# Patient Record
Sex: Female | Born: 1990 | Race: White | Hispanic: No | Marital: Single | Smoking: Former smoker
Health system: Southern US, Community
[De-identification: ages and names within clinical notes are randomized; demographics above are authoritative.]

## PROBLEM LIST (undated history)

## (undated) ENCOUNTER — Inpatient Hospital Stay (HOSPITAL_COMMUNITY): Payer: Self-pay

## (undated) DIAGNOSIS — B1920 Unspecified viral hepatitis C without hepatic coma: Secondary | ICD-10-CM

## (undated) DIAGNOSIS — Z8269 Family history of other diseases of the musculoskeletal system and connective tissue: Secondary | ICD-10-CM

## (undated) DIAGNOSIS — O152 Eclampsia in the puerperium: Secondary | ICD-10-CM

## (undated) DIAGNOSIS — Z8349 Family history of other endocrine, nutritional and metabolic diseases: Secondary | ICD-10-CM

## (undated) DIAGNOSIS — Z8249 Family history of ischemic heart disease and other diseases of the circulatory system: Secondary | ICD-10-CM

## (undated) DIAGNOSIS — A499 Bacterial infection, unspecified: Secondary | ICD-10-CM

## (undated) DIAGNOSIS — R011 Cardiac murmur, unspecified: Secondary | ICD-10-CM

## (undated) DIAGNOSIS — Z87898 Personal history of other specified conditions: Secondary | ICD-10-CM

## (undated) DIAGNOSIS — F1991 Other psychoactive substance use, unspecified, in remission: Secondary | ICD-10-CM

## (undated) DIAGNOSIS — Z87440 Personal history of urinary (tract) infections: Secondary | ICD-10-CM

## (undated) DIAGNOSIS — Z8619 Personal history of other infectious and parasitic diseases: Secondary | ICD-10-CM

## (undated) DIAGNOSIS — Z789 Other specified health status: Secondary | ICD-10-CM

## (undated) DIAGNOSIS — K759 Inflammatory liver disease, unspecified: Secondary | ICD-10-CM

## (undated) HISTORY — DX: Eclampsia complicating the puerperium: O15.2

## (undated) HISTORY — DX: Family history of ischemic heart disease and other diseases of the circulatory system: Z82.49

## (undated) HISTORY — DX: Personal history of other infectious and parasitic diseases: Z86.19

## (undated) HISTORY — DX: Personal history of urinary (tract) infections: Z87.440

## (undated) HISTORY — DX: Family history of other endocrine, nutritional and metabolic diseases: Z83.49

## (undated) HISTORY — PX: NO PAST SURGERIES: SHX2092

## (undated) HISTORY — DX: Bacterial infection, unspecified: A49.9

## (undated) HISTORY — DX: Other psychoactive substance use, unspecified, in remission: F19.91

## (undated) HISTORY — DX: Family history of other diseases of the musculoskeletal system and connective tissue: Z82.69

## (undated) HISTORY — DX: Personal history of other specified conditions: Z87.898

## (undated) HISTORY — DX: Inflammatory liver disease, unspecified: K75.9

---
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
## 2009-12-27 ENCOUNTER — Emergency Department (HOSPITAL_BASED_OUTPATIENT_CLINIC_OR_DEPARTMENT_OTHER): Admission: EM | Admit: 2009-12-27 | Discharge: 2009-12-27 | Payer: Self-pay | Admitting: Emergency Medicine

## 2010-05-20 ENCOUNTER — Emergency Department (HOSPITAL_COMMUNITY): Admission: EM | Admit: 2010-05-20 | Discharge: 2010-05-20 | Payer: Self-pay | Admitting: Emergency Medicine

## 2010-11-19 DIAGNOSIS — Z87440 Personal history of urinary (tract) infections: Secondary | ICD-10-CM

## 2010-11-19 HISTORY — DX: Personal history of urinary (tract) infections: Z87.440

## 2010-12-10 ENCOUNTER — Encounter: Payer: Self-pay | Admitting: Neurology

## 2011-02-04 LAB — URINALYSIS, ROUTINE W REFLEX MICROSCOPIC
Bilirubin Urine: NEGATIVE
Bilirubin Urine: NEGATIVE
Glucose, UA: NEGATIVE mg/dL
Glucose, UA: NEGATIVE mg/dL
Ketones, ur: NEGATIVE mg/dL
Nitrite: NEGATIVE
Protein, ur: 100 mg/dL — AB
Specific Gravity, Urine: 1.02 (ref 1.005–1.030)
Specific Gravity, Urine: 1.024 (ref 1.005–1.030)
Urobilinogen, UA: 1 mg/dL (ref 0.0–1.0)
pH: 6.5 (ref 5.0–8.0)
pH: 8.5 — ABNORMAL HIGH (ref 5.0–8.0)

## 2011-02-04 LAB — URINE MICROSCOPIC-ADD ON

## 2011-02-04 LAB — DIFFERENTIAL
Lymphocytes Relative: 14 % (ref 12–46)
Lymphs Abs: 1.9 10*3/uL (ref 0.7–4.0)
Monocytes Absolute: 0.5 10*3/uL (ref 0.1–1.0)
Monocytes Relative: 4 % (ref 3–12)
Neutro Abs: 11 10*3/uL — ABNORMAL HIGH (ref 1.7–7.7)

## 2011-02-04 LAB — URINE CULTURE: Colony Count: 45000

## 2011-02-04 LAB — POCT I-STAT, CHEM 8
BUN: 6 mg/dL (ref 6–23)
Calcium, Ion: 1.18 mmol/L (ref 1.12–1.32)
Chloride: 107 mEq/L (ref 96–112)
Glucose, Bld: 89 mg/dL (ref 70–99)
Potassium: 3.8 mEq/L (ref 3.5–5.1)

## 2011-02-04 LAB — CBC
HCT: 37.4 % (ref 36.0–46.0)
Hemoglobin: 13.1 g/dL (ref 12.0–15.0)
RBC: 4.02 MIL/uL (ref 3.87–5.11)
WBC: 13.7 10*3/uL — ABNORMAL HIGH (ref 4.0–10.5)

## 2011-04-06 NOTE — H&P (Signed)
NAMEARIANNIE, [name]                ACCOUNT NO.:  [id number]   MEDICAL RECORD NO.:  [id number]          PATIENT TYPE:  INP   LOCATION:  9164                          FACILITY:  [facility]   PHYSICIAN:  Crist Fat. Rivard, M.D. DATE OF BIRTH:  December 08, 1990   DATE OF ADMISSION:  09/02/2006  DATE OF DISCHARGE:                                HISTORY & PHYSICAL   Ms. Pesce is a 20 year old gravida 1, para 0 at 30 and 4/7 weeks, who  presented via EMS this morning secondary to two witnessed seizures.  One had  occurred at home at approximately 7 a.m.  This was witnessed by her mother  who described a drawing of the patient's right arm and then generalized  tonic clonic movement with respiratory difficulty.  EMS was called  immediately and the patient then was transported; however, prior to being  placed on the truck, EMS also witnessed another seizure of the same type.  They did report she was combative, status post the seizure and was confused.  She was postictal on arrival here in at the hospital but was arousable.  She  received 2 g of magnesium IV via EMS at 7:25 a.m. and then the patient  pulled out her IV.  Patient on arrival had no memory of the last several  days but her mother denied any knowledge of any drug use, illnesses,  exposures or trauma.  The patient had a mild headache over the last 24 hours  but had no visual symptoms or epigastric pain.  She did call the on call  staff at [facility] yesterday morning at approximately 10:40 with  the report of mild headache with some slight nausea and vomiting.  She  denied any headache visual symptoms, epigastric pain or swelling.  She  continues to deny those issues today.  Her pregnancy has been remarkable for  (1) age 40, (2) questionable last menstrual period, (3) previous smoker.   LABORATORY DATA:  Blood type is O positive, Rh antibody negative, VDRL  nonreactive, rubella titer positive, hepatitis B surface antigen negative.  Group B  strep culture is negative, HIV is declined.  Glucola was normal.  Hemoglobin upon entry into the practice was 13.8.  It was 11.8 at 26 weeks.  Toxoplasmosis titers were negative-negative.  GC and Chlamydia cultures were  negative at her visit, cystic fibrosis testing  was negative.  Patient  declined quadruple screen.  Her group B strep culture and other cultures  were negative at 36 weeks.  EDC of 09/19/2006 was established by ultrasound  secondary to questionable last menstrual period.   HISTORY OF PRESENT PREGNANCY:  Patient entered care at approximately 11  weeks.  She had an ultrasound at her first visit for dating purposes.  She  had a Pap smear in [facility] in February of 2007.  She was treated for BV at that first visit.  Patient had a sinus infection  at approximately 12 weeks, __________ and other OTCs were recommended.  Quadruple screen was declined.  She had another ultrasound at 17 weeks  showing normal  growth and development.  She had a normal Glucola.  She had  an ultrasound at 28 weeks for size less than dates.  Growth was at the 71st  percentile with normal fluid.  She had some contractions at 36 weeks.  The  cervix was long and closed.  She had another exam at 35 weeks with cervix 1  at 50% vertex and a -1 station.  Group B strep cultures, GC and Chlamydia  cultures were done and these were all negative.  She was diagnosed with head  lice on October 12.  This was treated and seems to have resolved.   OBSTETRICAL HISTORY:  The patient is a prima gravida.   PAST MEDICAL HISTORY:  Reports usual childhood illnesses.  She was a  previous smoker for three years but has not smoked since pregnancy.   ALLERGIES:  NO KNOWN DRUG ALLERGIES.   FAMILY HISTORY:  Her maternal grandmother has heart disease.  Her father and  paternal grandfather have hypertension.  Her mother has anemia.  Her  maternal grandmother has thyroid problems.  Her maternal  grandfather had  seizures.  Her maternal grandmother and mother have depression.   GENETIC HISTORY:  Remarkable for the patient being born with heart murmur  but no surgery or medications were needed.   SOCIAL HISTORY:  The patient is single. She does live with her mother and  her younger sister.  She is Caucasian of the [employer] faith.  The father of  the baby has been involved.  He is not present with her on arrival.  The  patient's grandmother is currently present with her and her mother is on the  way.  Patient has a 9th grade education.  She is still a Consulting civil engineer.  Her  partner is also in the 9th grade and he is also a Consulting civil engineer.  She has been  followed by physician services of [facility].  She denies any  alcohol, drug or tobacco use during this pregnancy.  She did stop smoking  with the onset of this pregnancy.   REVIEW OF SYSTEMS:  The patient currently is postictal with some confusion  regarding the day of the week.  She is oriented to person and place, is not  currently oriented to time.  She denies any headache, visual symptoms or  epigastric pain.  She is able to move all of her extremities and is having  no respiratory difficulties.   PHYSICAL EXAMINATION:  Blood pressure is 122/92 and was 166/86, respirations  were 30, pulse 106, oxygen saturations on room air 99%.  CHEST:  Clear.  HEART:  Regular rate and rhythm without murmur.  BREASTS:  Soft and nontender.  ABDOMEN:  Fundal height is approximately 37 cm, estimated fetal weight 7  __________ pounds.  Fetal heart rate initially is 180 to 190 with minimal  variability, now it is approximately 160 with 10 beat acceleration noted.  There are some mild contractions noted every four to five minutes.  Cervix  by Dr. Cloretta Ned exam 2 to 3 cm, 75%, vertex __________ station.  Deep tendon reflexes are 2+ with one beat of clonus on the right.  There is  minimal edema. NEUROLOGIC:  Per Dr. Estanislado Pandy showed a left dilated pupil  with decreased  reactivity, right pupil slightly dilated but reactive.  There is no nuchal  rigidity. Fundi are negative x2.  Reflexes were 2 to 4 with patient  currently on magnesium sulfate. Has good strength in all extremities.  There  is negative CVA tenderness.   Cath UA shows  100 mg of protein on a catheter specimen.  Urine drug screen  is negative.   ASSESSMENT:  1. Intrauterine pregnancy at 37-4/7 weeks.  2. Status post two eclamptic seizures.   PLAN:  1. Admit to birthing suite for consult with Dois Davenport A. Rivard, M.D.,      attending physician.  2. Check pH labs for type and screen for two units of packed red blood      cells if needed.  3. Magnesium sulfate 4 g bolus 2 g per hour.  4. Foley catheter will be maintained.  5. Bedside ultrasound for fluid, growth and presentation with examination      as well of the placenta.  6. Head imaging per Dr. Cloretta Ned plan.  She will consult with radiology      regarding the plan.  7. Further clinic care will be determined by Dr. Estanislado Pandy.      Renaldo Reel Emilee Hero, C.N.M.      Crist Fat Rivard, M.D.  Electronically Signed    VLL/MEDQ  D:  09/02/2006  T:  09/02/2006  Job:  [id number]

## 2011-04-06 NOTE — Consult Note (Signed)
[name], [name]                ACCOUNT NO.:  [id number]   MEDICAL RECORD NO.:  [id number]          PATIENT TYPE:  INP   LOCATION:  9164                          [facility]   PHYSICIAN:  Marlan Palau, M.D.  DATE OF BIRTH:  1990-12-05   DATE OF CONSULTATION:  09/02/2006  DATE OF DISCHARGE:                                   CONSULTATION   HISTORY OF PRESENT ILLNESS:  [name] is a 20 year old, left-handed,  white female born on June 22, 1991.  This patient is a patient that is  pregnant at [redacted] weeks.  The patient was brought into [facility] after 2  witnessed generalized tonic-clonic seizure events.  The patient had been  feeling well up to the point of the seizures.  The patient did note headache  today but really has not had headaches prior to the day of admission.  The  patient denies any visual field disturbance or visual loss.  The patient  initially was somewhat confused when she came in but has rapidly cleared  with mental status.  The patient has been placed on magnesium sulfate and CT  scan of the brain was done showing bilateral parietal and frontal white  matter low density areas.  For this reason, a neurology consultation was  obtained.  The patient is being induced for delivery, does have proteinuria.  Blood pressure is coming in up to 166/86.   PAST MEDICAL HISTORY:  1. History of current pregnancy.  2. Seizures x2, probable eclampsia.  3. No surgical past history.   CURRENT MEDICATIONS:  Magnesium sulfate, Stadol if needed, Pitocin and  promethazine.  The patient has no known allergies.   Smokes a half pack cigarettes daily when she is not pregnant. Does not drink  alcohol, again denies illicit drugs such as cocaine or marijuana.   SOCIAL HISTORY:  This patient lives in the [place] area. She  lives with her mother. This is her first pregnancy.  The patient again is  not married.  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   FAMILY MEDICAL HISTORY:  Mother is alive and well.  Father has history of  hypertension.  The patient has one sister who is alive and well.  No family  history of seizures is noted.   REVIEW OF SYSTEMS:  Notable for no recent fevers, chills.  The patient does  note a headache today but denies any prior history of headache, neck  stiffness.  The patient denies any shortness of breath, chest pains, nausea,  or vomiting.  The patient denies any focal numbness or weakness on the face,  arms or legs.   PHYSICAL EXAMINATION:  VITALS:  Blood pressure is currently 120/92, heart  rate 106, respiratory rate 30.  GENERAL:  This patient is a gravid white female who is sleepy at the time of  examination but can be alerted.  HEENT:  Head is atraumatic. Eyes, pupils are equal, round, and reactive  about 3-4 mm.  Disks soft and flat bilaterally.  NECK:  Supple.  No carotid bruits noted.  RESPIRATORY:  Clear.  CARDIOVASCULAR:  Regular rate and rhythm.  No obvious murmurs or rubs noted.  EXTREMITIES:  Reveal 1+ edema at the ankles bilaterally.  NEUROLOGIC:  Cranial nerves as above.  Facial symmetry is present.  The  patient has good sensation in the face to pinprick and soft touch  bilaterally.  Has good strength in the facial muscles and muscles to head  turning and shoulder shrug bilaterally. Speech is fairly well enunciated,  not aphasic.  Motor testing reveals good strength on all fours, good  symmetric motor tone is noted throughout.  Sensory testing reveals good  symmetry to pinprick, soft touch, vibratory sensation, throughout. The  patient is able is able to perform finger-nose-finger bilaterally.  Heel-to-  shin was not tested.  Gait was not tested.  Deep tendon reflexes are  symmetric, normal arms, somewhat brisk in the legs, 2-3 beats of ankle  clonus were noted.  Toes neutral bilaterally.   LABORATORY VALUES:  Notable for white count of 19.7, hemoglobin 13.6,   hematocrit 38.7, MCV of 90.2, platelets of 249, sodium 136, potassium 3.2,  chloride of 108, CO2 of 13, glucose of 106, BUN of 6, creatinine 0.7, total  bili 0.5, alkaline phosphatase 216, SGOT of 31, SGPT of 20, total protein  5.6, albumin of 2.4, calcium 8.4, uric acid 7.6, LDH of 293, magnesium level  of 4.8.  Urine drug screen was negative.  Urinalysis reveals specific  gravity of 1.030, pH of 6.0, 100 mg/dL protein.   IMPRESSION:  1. New onset of seizures, probably eclampsia syndrome.  2. Abnormal CT scan of the brain likely secondary to #1.   This patient will be induced for delivery at this point which should improve  the eclampsia. Changes in the MRI of the brain are likely related to an  eclampsia syndrome.  Would treat this patient with magnesium for eclampsia  or not add other anticonvulsants at this point.   PLAN:  1. MRI scan of the brain in the a.m. following delivery.  2. Consider followup MRI study in the next 2-3 weeks depending upon the      results of the above. I will follow the patient's clinical course while      in-house.      Marlan Palau, M.D.  Electronically Signed     CKW/MEDQ  D:  09/02/2006  T:  09/03/2006  Job:  [id number]   cc:   Dois Davenport A. Rivard, M.D.  Fax: [phone]

## 2011-04-06 NOTE — Discharge Summary (Signed)
[name], [name]                ACCOUNT NO.:  [id number]   MEDICAL RECORD NO.:  [id number]          PATIENT TYPE:  INP   LOCATION:  9118                          [facility]ILITY:  [facility]   PHYSICIAN:  Crist Fat. Rivard, M.D. DATE OF BIRTH:  [birth year]   DATE OF ADMISSION:  09/02/2006  DATE OF DISCHARGE:  09/07/2006                                 DISCHARGE SUMMARY   ADMISSION DIAGNOSES:  1. Intrauterine pregnancy at 37 4/7 weeks.  2. Eclampsia, status post eclamptic seizure x 2.   DISCHARGE DIAGNOSES:  1. Intrauterine pregnancy at 37 4/7 weeks.  2. Eclampsia, status post eclamptic seizure x 2.  3. Status post vaginal delivery of a viable female infant named [name],      Apgar's 8 and 9, weighing 6 pounds 1 ounce.   HOSPITAL PROCEDURES:  1. Magnesium sulfate administration.  2. Head CT and MRI.  3. Amniotomy.  4. Epidural anesthesia.  5. Spontaneous vaginal delivery of female infant, Apgar's 8 and 9,      weighing 6 pounds 1 ounce.  6. Repair of vulvar lacerations.  7. ICU care.   HOSPITAL COURSE:  The patient was admitted at 37 weeks after having two  witnessed seizure events. The patient was treated with magnesium sulfate to  diminish the seizure activity and other routine care was implemented. She  was seen by neurologist and maternal fetal medicine as well as ob-gyn  service.  Her labor was induced after her neurological exam was cleared.  Amniotomy was performed and Pitocin was begun.  She responded well to that  and had an uneventful labor.  She proceeded to vaginal delivery on 09/02/06  at 2057 hours of a female infant, born vaginally, Apgar's 8 and 9, weighing  6 pounds, 1 ounce. She had bilateral vulvar lacerations which were repaired.  EBL was 400 cc. She was taken to the AICU for monitoring and continued  magnesium sulfate administration. Blood pressures remained in the 130/90  range.  Labs were noted to be abnormal with a elevated D-dimer test. She  also had decrease in  her platelet count over the following days after  delivery from an initial count of 249 down to a nadir of 38 on 09/04/06.  She was followed by neurology and maternal fetal medicine during that time.  She continued to improve. 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 Blood  pressures on 09/04/06 remained 120-130/80-90 range.  She began diuresing on  09/05/06. She developed a fever on 09/04/06 of 102.  Cultures were done.  Recommendation on the 17th was made for IV steroids which was later  discontinued so as not to confound the assessment of the fever.  She had no  further febrile episodes after that.   On 09/05/06, she was afebrile.  Blood pressures were 130/80-90s.  She began  diuresis.  Liver function tests were elevated. She was treated with  Labetalol for her blood pressure.   On 09/05/06, her platelets rose to 70,000.  AST decreased from 234 to 142.  Blood  cultures showed no growth.   On 09/06/06, she had increased diuresis and blood pressures improved to  120/40 over 73/98.  She had a platelet count of 109 and AST decreased  further to 88.   On 09/07/06, she was moved out to Mother Baby Unit on the evening of  09/06/06 and on 09/07/06 she was doing well. Denies any headache or visual  changes. Blood pressures were 132/90 and 131/91.  She continued her  diuresis.  Platelet count was up to 134.  AST was down to 84.  Uric acid was  5.4.  She had her Labetalol increased previously to 300 mg twice a day and  this was felt to be adequate.  She was evaluated by maternal fetal medicine,  Neuro and Ob-Gyn and was felt to have received full benefit from hospital  stay and was discharged home on 09/07/06.   DISCHARGE MEDICATIONS:  1. Motrin 600 mg p.o. q. 6 hours p.r.n.  2. Labetalol 300 mg b.i.d.   DISCHARGE LABS:  White blood cell count 14.1, hemoglobin 9.6, platelets 134.  C-MET was within normal limits  except for slightly elevated AST of 84 and  ALT of 95.  Uric acid was 5.4.   DISCHARGE INSTRUCTIONS:  Per CC will be hand out with additional information  given on signs and symptoms of worsening pre-eclampsia/eclampsia.   DISCHARGE FOLLOW UP:  The patient has pediatric appointment on Monday and  will call to schedule an appointment at [facility] in our office on  same day, on Monday for a blood pressure check.  She will also follow-up  then two weeks later or p.r.n. as there is no Smart-Start Program in the  county in which she [place].  Discussions were had with the maternal fetal  medicine physician and her and her mother on obtaining phone access and  thermometer for care and follow-up.  She was also seen by social work and  deemed well for discharge. She will follow-up p.r.n. and reinforced  instructions were given on how to call our service after hours for any  concerns or questions and patient and mother expressed no questions.   CONDITION ON DISCHARGE:  Good.      [name], C.N.M.      Crist Fat Rivard, M.D.  Electronically Signed    MLW/MEDQ  D:  09/07/2006  T:  09/08/2006  Job:  [id number]

## 2011-08-16 ENCOUNTER — Emergency Department (HOSPITAL_COMMUNITY)
Admission: EM | Admit: 2011-08-16 | Discharge: 2011-08-17 | Disposition: A | Discharge status: Home / Self Care | Payer: Medicaid Other | Attending: Emergency Medicine | Admitting: Emergency Medicine

## 2011-08-16 DIAGNOSIS — J40 Bronchitis, not specified as acute or chronic: Secondary | ICD-10-CM | POA: Insufficient documentation

## 2011-08-16 DIAGNOSIS — E860 Dehydration: Secondary | ICD-10-CM | POA: Insufficient documentation

## 2011-08-16 DIAGNOSIS — O99891 Other specified diseases and conditions complicating pregnancy: Secondary | ICD-10-CM | POA: Insufficient documentation

## 2011-08-16 DIAGNOSIS — K5289 Other specified noninfective gastroenteritis and colitis: Secondary | ICD-10-CM | POA: Insufficient documentation

## 2011-08-16 DIAGNOSIS — R062 Wheezing: Secondary | ICD-10-CM | POA: Insufficient documentation

## 2011-08-16 LAB — CBC
MCV: 88.3 fL (ref 78.0–100.0)
Platelets: 299 10*3/uL (ref 150–400)
RBC: 4.61 MIL/uL (ref 3.87–5.11)
RDW: 11.9 % (ref 11.5–15.5)
WBC: 14.9 10*3/uL — ABNORMAL HIGH (ref 4.0–10.5)

## 2011-08-16 LAB — POCT I-STAT, CHEM 8
Calcium, Ion: 1.17 mmol/L (ref 1.12–1.32)
Chloride: 104 mEq/L (ref 96–112)
HCT: 45 % (ref 36.0–46.0)
Hemoglobin: 15.3 g/dL — ABNORMAL HIGH (ref 12.0–15.0)

## 2011-08-16 LAB — DIFFERENTIAL
Basophils Absolute: 0 10*3/uL (ref 0.0–0.1)
Basophils Relative: 0 % (ref 0–1)
Eosinophils Absolute: 0 10*3/uL (ref 0.0–0.7)
Eosinophils Relative: 0 % (ref 0–5)
Lymphs Abs: 0.8 10*3/uL (ref 0.7–4.0)
Neutrophils Relative %: 90 % — ABNORMAL HIGH (ref 43–77)

## 2011-08-16 LAB — POCT PREGNANCY, URINE: Preg Test, Ur: POSITIVE

## 2011-08-17 LAB — COMPREHENSIVE METABOLIC PANEL
ALT: 18 U/L (ref 0–35)
Alkaline Phosphatase: 81 U/L (ref 39–117)
BUN: 7 mg/dL (ref 6–23)
CO2: 22 mEq/L (ref 19–32)
Calcium: 9.8 mg/dL (ref 8.4–10.5)
Glucose, Bld: 103 mg/dL — ABNORMAL HIGH (ref 70–99)
Sodium: 133 mEq/L — ABNORMAL LOW (ref 135–145)
Total Protein: 8.2 g/dL (ref 6.0–8.3)

## 2011-08-17 LAB — URINALYSIS, ROUTINE W REFLEX MICROSCOPIC
Leukocytes, UA: NEGATIVE
Nitrite: NEGATIVE
Specific Gravity, Urine: 1.036 — ABNORMAL HIGH (ref 1.005–1.030)
Urobilinogen, UA: 0.2 mg/dL (ref 0.0–1.0)
pH: 5.5 (ref 5.0–8.0)

## 2011-08-17 LAB — URINE MICROSCOPIC-ADD ON

## 2011-08-17 LAB — LIPASE, BLOOD: Lipase: 13 U/L (ref 11–59)

## 2011-08-18 LAB — URINE CULTURE

## 2011-09-25 LAB — ANTIBODY SCREEN: Antibody Screen: NEGATIVE

## 2011-09-25 LAB — HIV ANTIBODY (ROUTINE TESTING W REFLEX): HIV: NONREACTIVE

## 2011-09-25 LAB — RPR: RPR: NONREACTIVE

## 2011-11-20 NOTE — L&D Delivery Note (Signed)
Delivery Note At 4:43 PM a viable female, "Anette Riedel", was delivered via Vaginal, Spontaneous Delivery (Presentation: Left Occiput Anterior).  APGAR: 9, 9; weight .   Placenta status: Intact, Spontaneous.  Cord: 3 vessels with the following complications: None. CAN x 1 loosely, reduced over vtx at delivery.  Cord pH: NA  Anesthesia: Epidural  Episiotomy:  Lacerations: 1st degree right labial Suture Repair: 3.0 monocryl Est. Blood Loss (mL): 200 cc  Mom to postpartum.  Baby to skin to skin Plans outpatient circ. Urine negative for protein, BP WNL. No need for further collection of urine.  Nigel Bridgeman 02/28/2012, 5:32 PM

## 2012-01-23 ENCOUNTER — Encounter (INDEPENDENT_AMBULATORY_CARE_PROVIDER_SITE_OTHER): Payer: Medicaid Other | Admitting: Obstetrics and Gynecology

## 2012-01-23 DIAGNOSIS — Z331 Pregnant state, incidental: Secondary | ICD-10-CM

## 2012-01-30 ENCOUNTER — Encounter (INDEPENDENT_AMBULATORY_CARE_PROVIDER_SITE_OTHER): Payer: Medicaid Other | Admitting: Obstetrics and Gynecology

## 2012-01-30 DIAGNOSIS — Z331 Pregnant state, incidental: Secondary | ICD-10-CM

## 2012-02-06 ENCOUNTER — Encounter (INDEPENDENT_AMBULATORY_CARE_PROVIDER_SITE_OTHER): Payer: Medicaid Other | Admitting: Obstetrics and Gynecology

## 2012-02-06 DIAGNOSIS — Z3480 Encounter for supervision of other normal pregnancy, unspecified trimester: Secondary | ICD-10-CM

## 2012-02-13 ENCOUNTER — Encounter (INDEPENDENT_AMBULATORY_CARE_PROVIDER_SITE_OTHER): Payer: Medicaid Other | Admitting: Obstetrics and Gynecology

## 2012-02-13 DIAGNOSIS — Z331 Pregnant state, incidental: Secondary | ICD-10-CM

## 2012-02-20 ENCOUNTER — Other Ambulatory Visit: Payer: Self-pay | Admitting: Obstetrics and Gynecology

## 2012-02-20 ENCOUNTER — Encounter (INDEPENDENT_AMBULATORY_CARE_PROVIDER_SITE_OTHER): Payer: Medicaid Other | Admitting: Obstetrics and Gynecology

## 2012-02-20 ENCOUNTER — Encounter (INDEPENDENT_AMBULATORY_CARE_PROVIDER_SITE_OTHER): Payer: Medicaid Other

## 2012-02-20 DIAGNOSIS — O99330 Smoking (tobacco) complicating pregnancy, unspecified trimester: Secondary | ICD-10-CM

## 2012-02-20 DIAGNOSIS — O26849 Uterine size-date discrepancy, unspecified trimester: Secondary | ICD-10-CM

## 2012-02-20 DIAGNOSIS — O0930 Supervision of pregnancy with insufficient antenatal care, unspecified trimester: Secondary | ICD-10-CM

## 2012-02-20 DIAGNOSIS — Z331 Pregnant state, incidental: Secondary | ICD-10-CM

## 2012-02-27 ENCOUNTER — Encounter: Payer: Self-pay | Admitting: Obstetrics and Gynecology

## 2012-02-27 ENCOUNTER — Ambulatory Visit (INDEPENDENT_AMBULATORY_CARE_PROVIDER_SITE_OTHER): Payer: Medicaid Other | Admitting: Obstetrics and Gynecology

## 2012-02-27 VITALS — BP 90/64 | Ht 65.5 in | Wt 170.0 lb

## 2012-02-27 DIAGNOSIS — Z331 Pregnant state, incidental: Secondary | ICD-10-CM

## 2012-02-27 NOTE — Progress Notes (Signed)
The patient  has no unusual complaints.Pt desires to be scheduled for IOL. Understands may increase risk of C/S. Will schedule at 41 weeks.

## 2012-02-27 NOTE — Progress Notes (Signed)
Swelling in hands and feet, pt experiencing pain below ribs and pressure.

## 2012-02-28 ENCOUNTER — Telehealth: Payer: Self-pay | Admitting: Obstetrics and Gynecology

## 2012-02-28 ENCOUNTER — Encounter (HOSPITAL_COMMUNITY): Payer: Self-pay | Admitting: Anesthesiology

## 2012-02-28 ENCOUNTER — Encounter (HOSPITAL_COMMUNITY): Payer: Self-pay | Admitting: *Deleted

## 2012-02-28 ENCOUNTER — Inpatient Hospital Stay (HOSPITAL_COMMUNITY)
Admission: AD | Admit: 2012-02-28 | Discharge: 2012-03-01 | DRG: 774 | Disposition: A | Discharge status: Home / Self Care | Payer: Medicaid Other | Source: Ambulatory Visit | Attending: Obstetrics and Gynecology | Admitting: Obstetrics and Gynecology

## 2012-02-28 ENCOUNTER — Inpatient Hospital Stay (HOSPITAL_COMMUNITY): Payer: Medicaid Other | Admitting: Anesthesiology

## 2012-02-28 DIAGNOSIS — O9903 Anemia complicating the puerperium: Secondary | ICD-10-CM | POA: Diagnosis not present

## 2012-02-28 DIAGNOSIS — D649 Anemia, unspecified: Secondary | ICD-10-CM | POA: Diagnosis not present

## 2012-02-28 DIAGNOSIS — O9081 Anemia of the puerperium: Secondary | ICD-10-CM | POA: Diagnosis not present

## 2012-02-28 DIAGNOSIS — O99892 Other specified diseases and conditions complicating childbirth: Secondary | ICD-10-CM | POA: Diagnosis present

## 2012-02-28 DIAGNOSIS — Z22330 Carrier of Group B streptococcus: Secondary | ICD-10-CM

## 2012-02-28 HISTORY — DX: Other specified health status: Z78.9

## 2012-02-28 LAB — COMPREHENSIVE METABOLIC PANEL
ALT: 15 U/L (ref 0–35)
AST: 18 U/L (ref 0–37)
Albumin: 2.7 g/dL — ABNORMAL LOW (ref 3.5–5.2)
Calcium: 8.8 mg/dL (ref 8.4–10.5)
Creatinine, Ser: 0.5 mg/dL (ref 0.50–1.10)
GFR calc non Af Amer: >90 mL/min (ref >90)
Sodium: 136 mEq/L (ref 135–145)
Total Protein: 5.8 g/dL — ABNORMAL LOW (ref 6.0–8.3)

## 2012-02-28 LAB — CBC
HCT: 37.1 % (ref 36.0–46.0)
Hemoglobin: 12.3 g/dL (ref 12.0–15.0)
MCH: 29.8 pg (ref 26.0–34.0)
RBC: 4.13 MIL/uL (ref 3.87–5.11)

## 2012-02-28 LAB — URINALYSIS, ROUTINE W REFLEX MICROSCOPIC
Bilirubin Urine: NEGATIVE
Glucose, UA: NEGATIVE mg/dL
Nitrite: NEGATIVE
Specific Gravity, Urine: >1.03 — ABNORMAL HIGH (ref 1.005–1.030)
pH: 5.5 (ref 5.0–8.0)

## 2012-02-28 LAB — RPR: RPR Ser Ql: NONREACTIVE

## 2012-02-28 LAB — URINE MICROSCOPIC-ADD ON

## 2012-02-28 LAB — URIC ACID: Uric Acid, Serum: 4.6 mg/dL (ref 2.4–7.0)

## 2012-02-28 MED ORDER — METHYLERGONOVINE MALEATE 0.2 MG/ML IJ SOLN
INTRAMUSCULAR | Status: AC
  Administered 2012-02-28: 0.2 mg
  Filled 2012-02-28: qty 1

## 2012-02-28 MED ORDER — FENTANYL 2.5 MCG/ML BUPIVACAINE 1/10 % EPIDURAL INFUSION (WH - ANES)
INTRAMUSCULAR | Status: DC | PRN
  Administered 2012-02-28: 14 mL/h via EPIDURAL

## 2012-02-28 MED ORDER — DIPHENHYDRAMINE HCL 50 MG/ML IJ SOLN: 12.5000 mg | INTRAMUSCULAR | Status: DC | PRN

## 2012-02-28 MED ORDER — MISOPROSTOL 200 MCG PO TABS
ORAL_TABLET | ORAL | Status: AC
  Filled 2012-02-28: qty 4

## 2012-02-28 MED ORDER — LIDOCAINE HCL (PF) 1 % IJ SOLN
30.0000 mL | INTRAMUSCULAR | Status: DC | PRN
  Administered 2012-02-28: 30 mL via SUBCUTANEOUS
  Filled 2012-02-28: qty 30

## 2012-02-28 MED ORDER — ONDANSETRON HCL 4 MG/2ML IJ SOLN: 4.0000 mg | INTRAMUSCULAR | Status: DC | PRN

## 2012-02-28 MED ORDER — IBUPROFEN 600 MG PO TABS
600.0000 mg | ORAL_TABLET | Freq: Four times a day (QID) | ORAL | Status: DC
  Administered 2012-02-28 – 2012-03-01 (×7): 600 mg via ORAL
  Filled 2012-02-28 (×7): qty 1

## 2012-02-28 MED ORDER — EPHEDRINE 5 MG/ML INJ
10.0000 mg | INTRAVENOUS | Status: DC | PRN
  Filled 2012-02-28: qty 4

## 2012-02-28 MED ORDER — PRENATAL MULTIVITAMIN CH
1.0000 | ORAL_TABLET | Freq: Every day | ORAL | Status: DC
  Administered 2012-02-29 – 2012-03-01 (×2): 1 via ORAL
  Filled 2012-02-28 (×2): qty 1

## 2012-02-28 MED ORDER — OXYTOCIN BOLUS FROM INFUSION
500.0000 mL | Freq: Once | INTRAVENOUS | Status: DC
  Filled 2012-02-28: qty 500

## 2012-02-28 MED ORDER — SIMETHICONE 80 MG PO CHEW: 80.0000 mg | CHEWABLE_TABLET | ORAL | Status: DC | PRN

## 2012-02-28 MED ORDER — MISOPROSTOL 200 MCG PO TABS
200.0000 ug | ORAL_TABLET | Freq: Once | ORAL | Status: DC
  Filled 2012-02-28: qty 1

## 2012-02-28 MED ORDER — PENICILLIN G POTASSIUM 5000000 UNITS IJ SOLR
2.5000 10*6.[IU] | INTRAVENOUS | Status: DC
  Administered 2012-02-28: 2.5 10*6.[IU] via INTRAVENOUS
  Filled 2012-02-28 (×4): qty 2.5

## 2012-02-28 MED ORDER — IBUPROFEN 600 MG PO TABS: 600.0000 mg | ORAL_TABLET | Freq: Four times a day (QID) | ORAL | Status: DC | PRN

## 2012-02-28 MED ORDER — TETANUS-DIPHTH-ACELL PERTUSSIS 5-2.5-18.5 LF-MCG/0.5 IM SUSP
0.5000 mL | Freq: Once | INTRAMUSCULAR | Status: AC
  Administered 2012-02-29: 0.5 mL via INTRAMUSCULAR
  Filled 2012-02-28: qty 0.5

## 2012-02-28 MED ORDER — ONDANSETRON HCL 4 MG PO TABS: 4.0000 mg | ORAL_TABLET | ORAL | Status: DC | PRN

## 2012-02-28 MED ORDER — BENZOCAINE-MENTHOL 20-0.5 % EX AERO: 1.0000 "application " | INHALATION_SPRAY | CUTANEOUS | Status: DC | PRN

## 2012-02-28 MED ORDER — LACTATED RINGERS IV SOLN: 500.0000 mL | INTRAVENOUS | Status: DC | PRN

## 2012-02-28 MED ORDER — PHENYLEPHRINE 40 MCG/ML (10ML) SYRINGE FOR IV PUSH (FOR BLOOD PRESSURE SUPPORT)
80.0000 ug | PREFILLED_SYRINGE | INTRAVENOUS | Status: DC | PRN
  Filled 2012-02-28: qty 5

## 2012-02-28 MED ORDER — PENICILLIN G POTASSIUM 5000000 UNITS IJ SOLR
5.0000 10*6.[IU] | Freq: Once | INTRAVENOUS | Status: AC
  Administered 2012-02-28: 5 10*6.[IU] via INTRAVENOUS
  Filled 2012-02-28: qty 5

## 2012-02-28 MED ORDER — FENTANYL 2.5 MCG/ML BUPIVACAINE 1/10 % EPIDURAL INFUSION (WH - ANES)
14.0000 mL/h | INTRAMUSCULAR | Status: DC
  Administered 2012-02-28: 14 mL/h via EPIDURAL
  Filled 2012-02-28 (×2): qty 60

## 2012-02-28 MED ORDER — OXYCODONE-ACETAMINOPHEN 5-325 MG PO TABS
1.0000 | ORAL_TABLET | ORAL | Status: DC | PRN
  Administered 2012-02-28: 2 via ORAL
  Filled 2012-02-28: qty 2

## 2012-02-28 MED ORDER — FLEET ENEMA 7-19 GM/118ML RE ENEM: 1.0000 | ENEMA | RECTAL | Status: DC | PRN

## 2012-02-28 MED ORDER — BENZOCAINE-MENTHOL 20-0.5 % EX AERO
INHALATION_SPRAY | CUTANEOUS | Status: AC
  Administered 2012-02-28: 20:00:00
  Filled 2012-02-28: qty 56

## 2012-02-28 MED ORDER — MISOPROSTOL 200 MCG PO TABS: 800.0000 ug | ORAL_TABLET | Freq: Once | ORAL | Status: DC

## 2012-02-28 MED ORDER — LACTATED RINGERS IV SOLN
500.0000 mL | Freq: Once | INTRAVENOUS | Status: AC
  Administered 2012-02-28: 500 mL via INTRAVENOUS

## 2012-02-28 MED ORDER — WITCH HAZEL-GLYCERIN EX PADS: 1.0000 "application " | MEDICATED_PAD | CUTANEOUS | Status: DC | PRN

## 2012-02-28 MED ORDER — LANOLIN HYDROUS EX OINT: TOPICAL_OINTMENT | CUTANEOUS | Status: DC | PRN

## 2012-02-28 MED ORDER — OXYTOCIN 20 UNITS IN LACTATED RINGERS INFUSION - SIMPLE
125.0000 mL/h | INTRAVENOUS | Status: DC
  Administered 2012-02-28: 125 mL/h via INTRAVENOUS
  Filled 2012-02-28: qty 1000

## 2012-02-28 MED ORDER — SENNOSIDES-DOCUSATE SODIUM 8.6-50 MG PO TABS
2.0000 | ORAL_TABLET | Freq: Every day | ORAL | Status: DC
  Administered 2012-02-28 – 2012-02-29 (×2): 2 via ORAL

## 2012-02-28 MED ORDER — ONDANSETRON HCL 4 MG/2ML IJ SOLN
4.0000 mg | Freq: Four times a day (QID) | INTRAMUSCULAR | Status: DC | PRN
  Administered 2012-02-28: 4 mg via INTRAVENOUS
  Filled 2012-02-28: qty 2

## 2012-02-28 MED ORDER — CITRIC ACID-SODIUM CITRATE 334-500 MG/5ML PO SOLN
30.0000 mL | ORAL | Status: DC | PRN
  Administered 2012-02-28: 30 mL via ORAL
  Filled 2012-02-28: qty 15

## 2012-02-28 MED ORDER — LIDOCAINE HCL (PF) 1 % IJ SOLN
INTRAMUSCULAR | Status: DC | PRN
  Administered 2012-02-28 (×2): 8 mL

## 2012-02-28 MED ORDER — LACTATED RINGERS IV SOLN
INTRAVENOUS | Status: DC
  Administered 2012-02-28: 125 mL/h via INTRAVENOUS
  Administered 2012-02-28: 11:00:00 via INTRAVENOUS

## 2012-02-28 MED ORDER — EPHEDRINE 5 MG/ML INJ: 10.0000 mg | INTRAVENOUS | Status: DC | PRN

## 2012-02-28 MED ORDER — ZOLPIDEM TARTRATE 5 MG PO TABS: 5.0000 mg | ORAL_TABLET | Freq: Every evening | ORAL | Status: DC | PRN

## 2012-02-28 MED ORDER — METHYLERGONOVINE MALEATE 0.2 MG/ML IJ SOLN: 0.2000 mg | INTRAMUSCULAR | Status: AC

## 2012-02-28 MED ORDER — OXYTOCIN 20 UNITS IN LACTATED RINGERS INFUSION - SIMPLE
125.0000 mL/h | Freq: Once | INTRAVENOUS | Status: AC
  Administered 2012-02-28: 125 mL/h via INTRAVENOUS
  Filled 2012-02-28: qty 1000

## 2012-02-28 MED ORDER — FAMOTIDINE 20 MG PO TABS
20.0000 mg | ORAL_TABLET | Freq: Two times a day (BID) | ORAL | Status: DC
  Administered 2012-02-28 – 2012-03-01 (×4): 20 mg via ORAL
  Filled 2012-02-28 (×4): qty 1

## 2012-02-28 MED ORDER — OXYCODONE-ACETAMINOPHEN 5-325 MG PO TABS: 1.0000 | ORAL_TABLET | ORAL | Status: DC | PRN

## 2012-02-28 MED ORDER — DIBUCAINE 1 % RE OINT: 1.0000 "application " | TOPICAL_OINTMENT | RECTAL | Status: DC | PRN

## 2012-02-28 MED ORDER — METHYLERGONOVINE MALEATE 0.2 MG PO TABS
0.2000 mg | ORAL_TABLET | Freq: Four times a day (QID) | ORAL | Status: DC
  Administered 2012-02-29 (×5): 0.2 mg via ORAL
  Filled 2012-02-28 (×5): qty 1

## 2012-02-28 MED ORDER — PHENYLEPHRINE 40 MCG/ML (10ML) SYRINGE FOR IV PUSH (FOR BLOOD PRESSURE SUPPORT): 80.0000 ug | PREFILLED_SYRINGE | INTRAVENOUS | Status: DC | PRN

## 2012-02-28 MED ORDER — MISOPROSTOL 200 MCG PO TABS
800.0000 ug | ORAL_TABLET | ORAL | Status: AC
  Administered 2012-02-28: 800 ug via VAGINAL
  Filled 2012-02-28: qty 4

## 2012-02-28 MED ORDER — BUTORPHANOL TARTRATE 2 MG/ML IJ SOLN: 1.0000 mg | INTRAMUSCULAR | Status: DC | PRN

## 2012-02-28 MED ORDER — ACETAMINOPHEN 325 MG PO TABS: 650.0000 mg | ORAL_TABLET | ORAL | Status: DC | PRN

## 2012-02-28 MED ORDER — DIPHENHYDRAMINE HCL 25 MG PO CAPS: 25.0000 mg | ORAL_CAPSULE | Freq: Four times a day (QID) | ORAL | Status: DC | PRN

## 2012-02-28 NOTE — Progress Notes (Signed)
  Subjective: Feeling no pressure or awareness of contractions.  Objective: BP 106/61  Pulse 82  Temp(Src) 97.2 F (36.2 C) (Oral)  Resp 18  Ht 5' 5.5" (1.664 m)  Wt 170 lb (77.111 kg)  BMI 27.86 kg/m2  SpO2 97%      FHT:  Category 1 UC:  q 4 min. SVE:   Dilation: Lip/rim Effacement (%): 90 Station: +1 Exam by:: V.Minervia Osso,CNM No sensation of pressure or descent with one trial push.    Assessment / Plan: Spontaneously progressive labor Will position in Semi-fowlers and await increased pressure. Augment prn if no change in contraction quality in next hour.   Nigel Bridgeman 02/28/2012, 3:30 PM

## 2012-02-28 NOTE — Progress Notes (Signed)
Called by RN pt states gushing O I&O cath 150 ml, SSE 300 ml clots removed     FF firm VSS A pp hemorrhage P discussed with Dr. Pennie Rushing at 2135 per telephone and this note plan methergine IM now and pitocin as ordered, VS q 15 min, place foley catheter. [name], CNM

## 2012-02-28 NOTE — MAU Note (Signed)
Pt in for labor eval, was 2 cm in office yesterday, reports ucs that started around 0730.  Reports small amount of blood tinged mucus.  + FM.

## 2012-02-28 NOTE — Telephone Encounter (Signed)
TC from pt.  States cont now 2-3 min.  Increased in intensity.   +FM   Consult with VL.  Pt to MAU.

## 2012-02-28 NOTE — Telephone Encounter (Signed)
Pt states has had cont q5 min since 7:30 am.  Is able to walk and talk through them.  +FM  No vag leakage.   Was 2cm  dilated 02/27/12.  Suggested maintain fluid intake.   Moniter x 1hr.  Call with status at that time or sooner if cont increase.   Pt agreeable.

## 2012-02-28 NOTE — Anesthesia Procedure Notes (Signed)
Epidural Patient location during procedure: OB Start time: 02/28/2012 11:29 AM End time: 02/28/2012 11:36 AM Reason for block: procedure for pain  Staffing Anesthesiologist: Sandrea Hughs Performed by: anesthesiologist   Preanesthetic Checklist Completed: patient identified, site marked, surgical consent, pre-op evaluation, timeout performed, IV checked, risks and benefits discussed and monitors and equipment checked  Epidural Patient position: sitting Prep: site prepped and draped and DuraPrep Patient monitoring: continuous pulse ox and blood pressure Approach: midline Injection technique: LOR air  Needle:  Needle type: Tuohy  Needle gauge: 17 G Needle length: 9 cm Needle insertion depth: 5 cm cm Catheter type: closed end flexible Catheter size: 19 Gauge Catheter at skin depth: 10 cm Test dose: negative and Other  Assessment Sensory level: T10 Events: blood not aspirated, injection not painful, no injection resistance, negative IV test and no paresthesia

## 2012-02-28 NOTE — Progress Notes (Signed)
  Subjective: Comfortable with epidural.  Family at bedside.  Objective: BP 102/75  Pulse 80  Temp(Src) 97.2 F (36.2 C) (Oral)  Resp 20  Ht 5' 5.5" (1.664 m)  Wt 170 lb (77.111 kg)  BMI 27.86 kg/m2  SpO2 97%      FHT:  Category 1 UC:   q4-6 min. SVE:   Dilation: 7 Effacement (%): 90 Station: 0 Exam by:: V.Amberly Livas,CNM AROM--clear fluid  Labs: Lab Results  Component Value Date   WBC 15.6* 02/28/2012   HGB 12.3 02/28/2012   HCT 37.1 02/28/2012   MCV 89.8 02/28/2012   PLT 284 02/28/2012    Assessment / Plan: Progressive labor Will continue to observe labor progress, augment prn.   Nigel Bridgeman 02/28/2012, 1:38 PM

## 2012-02-28 NOTE — Progress Notes (Signed)
Patient ID: [name], female   DOB: 09-24-1991, 21 y.o.   MRN: [id number] Dr. Pennie Rushing at bedside planned for cytotec 200 mg but without bleeding and canceled Orthostatic VS stable, FF minimal vag bleeding, has CBC at 0500, will saline lock after this liter, leave foley in until rounds in am. A PP hemorrhage    orthostaticaly stable P continue care [name], CNM

## 2012-02-28 NOTE — H&P (Signed)
[name] is a 21 y.o. female, G2P1001 at 75 6/7 weeks, presenting for labor evaluation.  Started contracting around 7:30am, now stronger and more regular. Denies leaking or bleeding, reports +FM.  Denies HA, visual symptoms, or epigastric pain.  Pregnancy remarkable for: Hx eclampsia with last pregnancy Hx smoking, now stopped Late to care Positive GBS  History History of present pregnancy: Patient entered care at 17 4/7 weels.  EDC of 02/29/12 was established by [place] at 17 4/7 weeks due to uncertain LMP.  Anatomy scan was done at 20 weeks, with normal findings.  Further ultrasounds were done at 38 weeks, with EFW 7+2 and normal fluid.  Her prenatal course was essentially uncomplicated.  At her last evaluation yesterday, she was 2 cm.  OB History    Grav Para Term Preterm Abortions TAB SAB Ect Mult Living   2 1 1   0    0 1     Obstetric Comments   Hx of preeclampsia    #1--2007. SVB Induced due to eclamptic seizure at home.  Female, 6+8 at 38 weeks, epidural.  On Magnesium sulfate. #2--Current  Past Medical History  Diagnosis Date  . No pertinent past medical history   Hx eclamptic seizure 2007 during pregnancy.  Hx pyelonephritis 2012.  Previous DepoProvera user. Hx heart murmur as baby.  Hx irregular cycles.  Past Surgical History  Procedure Date  . No past surgeries    Family History: family history includes Anemia in her mother; Heart disease in her maternal grandmother; and Hypertension in her father.  There is no history of Anesthesia problems. Mother anemia, depression. MGM heart disease, varicosities, depression.  MGF seizures.  Multiple family member smoke.    Social History:  Previous smoker, stopped with pregnancy.  Denies drug or etoh use.  Has 9th grade education, employed at [employer].  Caucasian, of the Holiness faith.  FOB, Rolena Infante, is involved and supportive, has his GED, and is employed.  ROS:  Contractions, +FM, denies any other symptoms.  Dilation:  4 Effacement (%): 90 Station: -1 Exam by:: Manfred Arch, CNM Blood pressure 112/85, pulse 81, temperature 98.4 F (36.9 C), temperature source Oral, resp. rate 18, height 5' 5.5" (1.664 m), weight 170 lb (77.111 kg).  Exam Physical Exam   Chest clear Heart RRR without murmur Abd gravid, NT Pelvic--as above Ext WNL FHR Category 1 UCs q 4-6 min, moderate.  Prenatal labs: ABO, Rh:  O+ Antibody:  Neg Rubella:  Immune RPR:   NR HBsAg:   Neg HIV:   Neg GBS: Positive (03/18 0000)  Hgb 11.6 at NOB/11.7 at 28 weeks Glucola 95 Cultures negative at NOB and at 36 weeks Quad screen WNL  Assessment/Plan: IUP at 39 6/7 weeks Active labor Positive GBS Hx eclampsia last pregnancy  Plan: Admit to Birthing Suite per consult with Dr. Pennie Rushing Routine CNM orders Plan GBS Rx with PCN G per standard dosing Epidural prn.  Check PIH labs and UA--if proteinuric or if BP elevated during labor, will complete 24 hour urine (start with foley placement).  Nigel Bridgeman 02/28/2012, 10:34 AM

## 2012-02-28 NOTE — Progress Notes (Signed)
Called by RN to evaluate bleeding up to bathroom with 2 golf ball size clots not dizzy  Comfortable no complaints Hemoglobin & Hematocrit     Component Value Date/Time   HGB 12.3 02/28/2012 1100   HCT 37.1 02/28/2012 1100    BP 104/74  Pulse 96  Temp(Src) 97.8 F (36.6 C) (Oral)  Resp 18  Ht 5' 5.5" (1.664 m)  Wt 170 lb (77.111 kg)  BMI 27.86 kg/m2  SpO2 98%  Breastfeeding? Unknown FF at u -1 with 2  1-3 cm clots with fundal check and digital check, no trickling Perineum edematous with ecchymosis, with L lac sutured no labia bleeding  A PP 4 hours P cytotce 800 mcg rectally now, monitor voids and call for assistance with next void. [name], CNM

## 2012-02-28 NOTE — Anesthesia Postprocedure Evaluation (Signed)
  Anesthesia Post-op Note  Patient: [name]  Procedure(s) Performed: * No procedures listed *  Patient Location: Mother/Baby  Anesthesia Type: Epidural  Level of Consciousness: awake, alert  and oriented  Airway and Oxygen Therapy: Patient Spontanous Breathing  Post-op Pain: mild  Post-op Assessment: Patient's Cardiovascular Status Stable, Respiratory Function Stable, Patent Airway, No signs of Nausea or vomiting and Pain level controlled  Post-op Vital Signs: stable  Complications: No apparent anesthesia complications

## 2012-02-28 NOTE — Anesthesia Preprocedure Evaluation (Signed)

## 2012-02-29 LAB — CBC
HCT: 27.9 % — ABNORMAL LOW (ref 36.0–46.0)
Hemoglobin: 9.3 g/dL — ABNORMAL LOW (ref 12.0–15.0)
MCV: 90.3 fL (ref 78.0–100.0)
RBC: 3.09 MIL/uL — ABNORMAL LOW (ref 3.87–5.11)
WBC: 15.7 10*3/uL — ABNORMAL HIGH (ref 4.0–10.5)

## 2012-02-29 MED ORDER — FERROUS SULFATE 325 (65 FE) MG PO TABS
325.0000 mg | ORAL_TABLET | Freq: Two times a day (BID) | ORAL | Status: DC
  Administered 2012-02-29 – 2012-03-01 (×3): 325 mg via ORAL
  Filled 2012-02-29 (×3): qty 1

## 2012-02-29 NOTE — Progress Notes (Signed)
UR Chart review completed.  

## 2012-02-29 NOTE — Progress Notes (Signed)
Patient not having good output at this time- total amount of output since catheter put in at 2200 was 50 ml of concentrated urine. In and out cath was done around 2130-2145 with output of 150 ml of clear, yellow urine. CNM called about decreased output-no further action as we count the in and out urine output also.

## 2012-02-29 NOTE — Progress Notes (Signed)
Pt assisted to the bathroom via Stedy-tolerated well. Pad had 2 golf ball sized clots prior to doing peri care. Pt was assisted back to bed after peri care done and fundal check done-  One below with firm fundus, with trickling of bright red lochia; peri edema and bruising noted with assessment. CNM notified of findings and came to check pt around 2030 and gave Cytotec via rectum around 2050. Pt cleaned, vitals WNL, ice pack to perineum for swelling and pt advised to call if any increased bleeding. Pt called me about "feeling like a pool of blood just came out" around 2110-CNM called again to reevaluate pt. In and out catheter ordered to empty bladder-150 ml of clear, yellow urine. Upon examination expressed several clots and steady gushing of blood with fundal massages.Methergine series order in, LR with 20 units of Pitocin given via IV as ordered, as well as methergine IM stat in left hip by the assisting nurse Orbie Hurst. VS wnl during this process and charted. Pt given 2 percocet around 2115 for the expected cramping/pain from medicines. Dr. Pennie Rushing called by CNM-indwelling catheter ordered to stay in overnight to monitor output (concentrated urine return with placement) and Q15 min vitals ordered for 1 hr, along with orthostatic VS. Dr. Pennie Rushing evaluated patient with no further orders. Orthostatic VS were well WNL and pt did not have any dizziness. Pt resting and comfortable with no complaints

## 2012-02-29 NOTE — Progress Notes (Signed)
Post Partum Day 1 Subjective: no complaints, up ad lib, tolerating PO, + flatus and VB lighter.  Formula-feeding.  Planning outpat circ.  Little rest overnight.    Objective: Blood pressure 92/63, pulse 73, temperature 97.6 F (36.4 C), temperature source Oral, resp. rate 18, height 5' 5.5" (1.664 m), weight 77.111 kg (170 lb), SpO2 98.00%, unknown if currently breastfeeding.  Physical Exam:  General: alert, cooperative and no distress Lochia: appropriate, lochia Uterine Fundus: firm, u/-2 Incision: n/a DVT Evaluation: No evidence of DVT seen on physical exam. Negative Homan's sign. No significant calf/ankle edema. Foley still to straight drain.    Basename 02/29/12 0655 02/28/12 1100  HGB 9.3* 12.3  HCT 27.9* 37.1    Assessment/Plan: Plan for discharge tomorrow; Delayed PPH last night.  PP anemia.  Formula-feeding.   Will do orthostatic VS and start Fe po.  D/c foley and saline lock this AM.  Disc'd breast care and rec'd pt wear bra continuously.     LOS: 1 day   [name] H 02/29/2012, 10:42 AM

## 2012-03-01 DIAGNOSIS — O9081 Anemia of the puerperium: Secondary | ICD-10-CM | POA: Diagnosis not present

## 2012-03-01 LAB — CBC
HCT: 24.7 % — ABNORMAL LOW (ref 36.0–46.0)
Hemoglobin: 8.1 g/dL — ABNORMAL LOW (ref 12.0–15.0)
MCH: 30.1 pg (ref 26.0–34.0)
MCHC: 32.8 g/dL (ref 30.0–36.0)
MCV: 91.8 fL (ref 78.0–100.0)

## 2012-03-01 MED ORDER — FERROUS SULFATE 325 (65 FE) MG PO TABS: 325.0000 mg | ORAL_TABLET | Freq: Every day | ORAL | Status: DC

## 2012-03-01 MED ORDER — IBUPROFEN 600 MG PO TABS: 600.0000 mg | ORAL_TABLET | Freq: Four times a day (QID) | ORAL | Status: AC | PRN

## 2012-03-01 MED ORDER — MEDROXYPROGESTERONE ACETATE 150 MG/ML IM SUSP
150.0000 mg | Freq: Once | INTRAMUSCULAR | Status: AC
  Administered 2012-03-01: 150 mg via INTRAMUSCULAR
  Filled 2012-03-01: qty 1

## 2012-03-01 NOTE — Discharge Summary (Signed)
Obstetric Discharge Summary Reason for Admission: onset of labor Prenatal Procedures: NST and ultrasound Intrapartum Procedures: spontaneous vaginal delivery Postpartum Procedures: none Complications-Operative and Postpartum: 1st degree labial laceration and hemorrhage, postpartum; anemia with hemodynamic stability Hemoglobin  Date Value Range Status  03/01/2012 8.1* 12.0-15.0 (g/dL) Final     HCT  Date Value Range Status  03/01/2012 24.7* 36.0-46.0 (%) Final   Hospital Course: Admitted 02/28/12 in early labor. Positive GBS. Progressed with pitocin augmentation and epidural. Delivery was performed by Nigel Bridgeman, CNM,  without complication. Patient and baby tolerated the procedure without difficulty, with  Right 1st degree labial laceration noted--repaired for hemostasis. Infant to FTN. Mother had a postpartum hemorrhage several hours after delivery, controlled with Cytotech and methergine.  Her vital remained stable throughout, and she and her infant then had an uncomplicated postpartum course, with bottle feeding going well. Mom's physical exam was WNL, her orthostatic vital signs were stable, and she was discharged home in stable condition.  She declined transfusion.  Contraception plan was DepoProvera IM before discharge.  She received adequate benefit from po pain medications, and was sent home with Ibuprophen and FeSO4 as meds.   Physical Exam:  General: alert Lochia: appropriate Uterine Fundus: firm Incision: healing well DVT Evaluation: No evidence of DVT seen on physical exam. Negative Homan's sign.  Discharge Diagnoses: Term Pregnancy-delivered and postpartum hemorrhage, anemia  Discharge Information: Date: 03/01/2012 Activity: Per [facility] handout Diet: routine Medications: Ibuprofen and Iron Condition: stable Instructions: refer to practice specific booklet Discharge to: home Contraception:  DepoProvera on discharge Follow-up Information    Follow up with [facility] in 6 weeks. (Call as needed)          Newborn Data: Live born female  Birth Weight: 6 lb 8.6 oz (2965 g) APGAR: 9, 9  Home with mother.  Carlise Stofer 03/01/2012, 8:09 AM

## 2012-03-01 NOTE — Discharge Instructions (Signed)
Anemia, Nonspecific Your exam and blood tests show you are anemic. This means your blood (hemoglobin) level is low. Normal hemoglobin values are 12 to 15 g/dL for females and 14 to 17 g/dL for males. Make a note of your hemoglobin level today. The hematocrit percent is also used to measure anemia. A normal hematocrit is 38% to 46% in females and 42% to 49% in males. Make a note of your hematocrit level today. CAUSES  Anemia can be due to many different causes.  Excessive bleeding from periods (in women).   Intestinal bleeding.   Poor nutrition.   Kidney, thyroid, liver, and bone marrow diseases.  SYMPTOMS  Anemia can come on suddenly (acute). It can also come on slowly. Symptoms can include:  Minor weakness.   Dizziness.   Palpitations.   Shortness of breath.  Symptoms may be absent until half your hemoglobin is missing if it comes on slowly. Anemia due to acute blood loss from an injury or internal bleeding may require blood transfusion if the loss is severe. Hospital care is needed if you are anemic and there is significant continual blood loss. TREATMENT   Stool tests for blood (Hemoccult) and additional lab tests are often needed. This determines the best treatment.   Further checking on your condition and your response to treatment is very important. It often takes many weeks to correct anemia.  Depending on the cause, treatment can include:  Supplements of iron.   Vitamins B12 and folic acid.   Hormone medicines.If your anemia is due to bleeding, finding the cause of the blood loss is very important. This will help avoid further problems.  SEEK IMMEDIATE MEDICAL CARE IF:   You develop fainting, extreme weakness, shortness of breath, or chest pain.   You develop heavy vaginal bleeding.   You develop bloody or black, tarry stools or vomit up blood.   You develop a high fever, rash, repeated vomiting, or dehydration.  Document Released: 12/13/2004 Document Revised:  10/25/2011 Document Reviewed: 09/20/2009 [facility] Patient Information 2012 [place], [place].  Postpartum Care After Vaginal Delivery After you deliver your baby, you will stay in the hospital for 24 to 72 hours, unless there were problems with the labor or delivery, or you have medical problems. While you are in the hospital, you will receive help and instructions on how to care for yourself and your baby. Your doctor will order pain medicine, in case you need it. You will have a small amount of bleeding from your vagina and should change your sanitary pad frequently. Wash your hands thoroughly with soap and water for at least 20 seconds after changing pads and using the toilet. Let the nurses know if you begin to pass blood clots or your bleeding increases. Do not flush blood clots down the toilet before having the nurse look at them, to make sure there is no placental tissue with them. If you had an intravenous (IV), it will be removed within 24 hours, if there are no problems. The first time you get out of bed or take a shower, call the nurse to help you because you may get weak, lightheaded, or even faint. If you are breastfeeding, you may feel painful contractions of your uterus for a couple of weeks. This is normal. The contractions help your uterus get back to normal size. If you are not breastfeeding, wear a supportive bra and handle your breasts as little as possible until your milk has dried up. Hormones should not be given to  dry up the breasts, because they can cause blood clots. You will be given your normal diet, unless you have diabetes or other medical problems.  The nurses may put an ice pack on your episiotomy (surgically enlarged opening), if you have one, to reduce the pain and swelling. On rare occasions, you may not be able to urinate and the nurse will need to empty your bladder with a catheter. If you had a postpartum tubal ligation ("tying tubes," female sterilization), it should not  make your stay in the hospital longer. You may have your baby in your room with you as much as you like, unless you or the baby has a problem. Use the bassinet (basket) for the baby when going to and from the nursery. Do not carry the baby. Do not leave the postpartum area. If the mother is Rh negative (lacks a protein on the red blood cells) and the baby is Rh positive, the mother should get a Rho-gam shot to prevent Rh problems with future pregnancies. You may be given written instructions for you and your baby, and necessary medicines, when you are discharged from the hospital. Be sure you understand and follow the instructions as advised. HOME CARE INSTRUCTIONS   Follow instructions and take the medicines given to you.   Only take over-the-counter or prescription medicines for pain, discomfort, or fever as directed by your caregiver.   Do not take aspirin, because it can cause bleeding.   Increase your activities a little bit every day to build up your strength and endurance.   Do not drink alcohol, especially if you are breastfeeding or taking pain medicine.   Take your temperature twice a day and record it.   You may have a small amount of bleeding or spotting for 2 to 4 weeks. This is normal.   Do not use tampons or douche. Use sanitary pads.   Try to have someone stay and help you for a few days when you go home.   Try to rest or take a nap when the baby is sleeping.   If you are breastfeeding, wear a good support bra. If you are not breastfeeding, wear a supportive bra and do not stimulate your nipples.   Eat a healthy, nutritious diet and continue to take your prenatal vitamins.   Do not drive, do any heavy activities, or travel until your caregiver tells you it is okay.   Do not have intercourse until your caregiver gives you permission to do so.   Ask your caregiver when you can begin to exercise and what type of exercises to do.   Call your caregiver if you think you  are having a problem from your delivery.   Call your pediatrician if you are having a problem with the baby.   Schedule your postpartum visit and keep it.  SEEK MEDICAL CARE IF:   You have a temperature of 100 F (37.8 C) or higher.   You have increased vaginal bleeding or are passing clots. Save any clots to show your caregiver.   You have bloody urine or pain when you urinate.   You have a bad smelling vaginal discharge.   You have increasing pain or swelling on your episiotomy.   You develop a severe headache.   You feel depressed.   The episiotomy is separating.   You become dizzy or lightheaded.   You develop a rash.   You have a reaction or problems with your medicine.   You have  pain, redness, or swelling at the intravenous site.  SEEK IMMEDIATE MEDICAL CARE IF:   You have chest pain.   You develop shortness of breath.   You pass out.   You develop pain, with or without swelling or redness in your leg.   You develop heavy vaginal bleeding, with or without blood clots.   You develop stomach pain.   You develop a bad smelling vaginal discharge.  MAKE SURE YOU:   Understand these instructions.   Will watch your condition.   Will get help right away if you are not doing well or get worse.  Document Released: 09/02/2007 Document Revised: 10/25/2011 Document Reviewed: 09/14/2009 [facility] Patient Information 2012 [place], [place].

## 2012-03-04 ENCOUNTER — Other Ambulatory Visit: Payer: Medicaid Other

## 2012-03-04 ENCOUNTER — Encounter: Payer: Medicaid Other | Admitting: Obstetrics and Gynecology

## 2012-04-09 DIAGNOSIS — Z87440 Personal history of urinary (tract) infections: Secondary | ICD-10-CM | POA: Insufficient documentation

## 2012-04-10 ENCOUNTER — Encounter: Payer: Self-pay | Admitting: Obstetrics and Gynecology

## 2012-04-10 ENCOUNTER — Ambulatory Visit (INDEPENDENT_AMBULATORY_CARE_PROVIDER_SITE_OTHER): Payer: Medicaid Other | Admitting: Obstetrics and Gynecology

## 2012-04-10 NOTE — Progress Notes (Signed)
Date of delivery: 02/28/12 Female Name: Anette Riedel Vaginal delivery:yes Cesarean section:no Tubal ligation:no GDM:no Breast Feeding:no Bottle Feeding:yes Post-Partum Blues:no Abnormal pap:no Normal GU function: yes Normal GI function:yes Returning to work:yes EPDS: 0 Last pap 09/25/11 Loralyn Fresh[place] . female who presents for a postpartum visit. She is without complaint ABD: soft nontender GU: vulva normal no masses seen.  Vagina normal in appearance.  Cervix is parous and NT.  Uterus normal size.  No adnexal tenderness bilaterally or fullness EXT: no CCEB Depo-Provera May resume intercourse , exercise and normal activity RT 11/13 for aex

## 2013-07-18 ENCOUNTER — Emergency Department (HOSPITAL_BASED_OUTPATIENT_CLINIC_OR_DEPARTMENT_OTHER)
Admission: EM | Admit: 2013-07-18 | Discharge: 2013-07-18 | Disposition: A | Discharge status: Home / Self Care | Payer: Medicaid Other | Attending: Emergency Medicine | Admitting: Emergency Medicine

## 2013-07-18 ENCOUNTER — Encounter (HOSPITAL_BASED_OUTPATIENT_CLINIC_OR_DEPARTMENT_OTHER): Payer: Self-pay

## 2013-07-18 DIAGNOSIS — R0982 Postnasal drip: Secondary | ICD-10-CM | POA: Insufficient documentation

## 2013-07-18 DIAGNOSIS — R0602 Shortness of breath: Secondary | ICD-10-CM | POA: Insufficient documentation

## 2013-07-18 DIAGNOSIS — R05 Cough: Secondary | ICD-10-CM | POA: Insufficient documentation

## 2013-07-18 DIAGNOSIS — R079 Chest pain, unspecified: Secondary | ICD-10-CM | POA: Insufficient documentation

## 2013-07-18 DIAGNOSIS — Z87440 Personal history of urinary (tract) infections: Secondary | ICD-10-CM | POA: Insufficient documentation

## 2013-07-18 DIAGNOSIS — J40 Bronchitis, not specified as acute or chronic: Secondary | ICD-10-CM | POA: Insufficient documentation

## 2013-07-18 DIAGNOSIS — R062 Wheezing: Secondary | ICD-10-CM | POA: Insufficient documentation

## 2013-07-18 DIAGNOSIS — J3489 Other specified disorders of nose and nasal sinuses: Secondary | ICD-10-CM | POA: Insufficient documentation

## 2013-07-18 DIAGNOSIS — R059 Cough, unspecified: Secondary | ICD-10-CM | POA: Insufficient documentation

## 2013-07-18 DIAGNOSIS — Z8742 Personal history of other diseases of the female genital tract: Secondary | ICD-10-CM | POA: Insufficient documentation

## 2013-07-18 DIAGNOSIS — O9989 Other specified diseases and conditions complicating pregnancy, childbirth and the puerperium: Secondary | ICD-10-CM | POA: Insufficient documentation

## 2013-07-18 DIAGNOSIS — Z87891 Personal history of nicotine dependence: Secondary | ICD-10-CM | POA: Insufficient documentation

## 2013-07-18 DIAGNOSIS — Z8619 Personal history of other infectious and parasitic diseases: Secondary | ICD-10-CM | POA: Insufficient documentation

## 2013-07-18 MED ORDER — PREDNISONE 20 MG PO TABS: ORAL_TABLET | ORAL | Status: DC

## 2013-07-18 MED ORDER — PREDNISONE 10 MG PO TABS
60.0000 mg | ORAL_TABLET | Freq: Once | ORAL | Status: AC
  Administered 2013-07-18: 60 mg via ORAL
  Filled 2013-07-18: qty 1

## 2013-07-18 MED ORDER — AMOXICILLIN-POT CLAVULANATE 875-125 MG PO TABS: 1.0000 | ORAL_TABLET | Freq: Two times a day (BID) | ORAL | Status: DC

## 2013-07-18 MED ORDER — ALBUTEROL SULFATE (5 MG/ML) 0.5% IN NEBU
INHALATION_SOLUTION | RESPIRATORY_TRACT | Status: AC
  Administered 2013-07-18: 5 mg
  Filled 2013-07-18: qty 1

## 2013-07-18 MED ORDER — ALBUTEROL SULFATE HFA 108 (90 BASE) MCG/ACT IN AERS
2.0000 | INHALATION_SPRAY | RESPIRATORY_TRACT | Status: DC
  Administered 2013-07-18: 2 via RESPIRATORY_TRACT
  Filled 2013-07-18: qty 6.7

## 2013-07-18 NOTE — ED Notes (Signed)
Patient reports that she developed head congestion and started sudafed last pm and today developed productive cough with yellow-green sputum and wheezing. Hx of bronchitis, denies asthma. 4 months pregnant.

## 2013-07-18 NOTE — ED Provider Notes (Signed)
CSN: [id number]     Arrival date & time 07/18/13  1840 History  This chart was scribed for Rolan Bucco, MD by Karle Plumber, ED Scribe. This patient was seen in room MH07/MH07 and the patient's care was started at 7:25 PM.    Chief Complaint  Patient presents with  . Cough   The history is provided by the patient. No language interpreter was used.   HPI Comments:  [name] is a 22 y.o. female who is 4 months pregnant presents to the Emergency Department complaining constant, moderate shortness of breath and wheezing onset yesterday. There is associated cough productive of yellow-white sputum and congestion. She also reports some mild chest pain and upper back pain. She states that she's had similar symptoms in the past with bronchitisShe denies fever, leg pain, leg swelling, or any other symptoms. She states she has had prior similar wheezing and has used inhalers to treat with prior episodes of bronchitis. She states that she took Sudafed for her congestion last night. She does not have an OB stating that her Medicaid coverage has not started yet. She reports that she has noticed the baby move. She denies any vaginal discharge, bleeding or abdominal pain. She has not been taking prenatal vitamins. She denies history of asthma. Pt denies asthma.   Past Medical History  Diagnosis Date  . No pertinent past medical history   . Family history of varicose veins   . FHx: hypertension   . FHx: thyroid disease   . FHx: scoliosis   . H/O varicella   . Hx: UTI (urinary tract infection) 2012  . Postpartum eclampsia 08/2006  . Bacterial infection    Past Surgical History  Procedure Laterality Date  . No past surgeries     Family History  Problem Relation Age of Onset  . Anemia Mother   . Depression Mother   . Hypertension Father   . Heart disease Maternal Grandmother   . Depression Maternal Grandmother   . Anesthesia problems Neg Hx   . Heart disease Paternal Grandfather     History  Substance Use Topics  . Smoking status: Former Games developer  . Smokeless tobacco: Not on file  . Alcohol Use: No   OB History   Grav Para Term Preterm Abortions TAB SAB Ect Mult Living   3 2 2   0    0 2     Obstetric Comments   Hx of preeclampsia     Review of Systems  Constitutional: Negative for fever, chills, diaphoresis and fatigue.  HENT: Positive for congestion, rhinorrhea and postnasal drip. Negative for sneezing.   Eyes: Negative.   Respiratory: Positive for cough, shortness of breath and wheezing. Negative for chest tightness.   Cardiovascular: Positive for chest pain. Negative for leg swelling.  Gastrointestinal: Negative for nausea, vomiting, abdominal pain, diarrhea and blood in stool.  Genitourinary: Negative for frequency, hematuria, flank pain and difficulty urinating.  Musculoskeletal: Negative for back pain and arthralgias.  Skin: Negative for rash.  Neurological: Negative for dizziness, speech difficulty, weakness, numbness and headaches.    Allergies  Review of patient's allergies indicates no known allergies.  Home Medications  No current outpatient prescriptions on file. Triage Vitals: BP 118/74  Pulse 113  Temp(Src) 98.4 F (36.9 C) (Oral)  SpO2 98%  LMP 03/28/2013 Physical Exam  Constitutional: She is oriented to person, place, and time. She appears well-developed and well-nourished.  HENT:  Head: Normocephalic and atraumatic.  Mouth/Throat: Oropharynx is clear and moist.  Eyes: Pupils are equal, round, and reactive to light.  Neck: Normal range of motion. Neck supple.  Cardiovascular: Normal rate, regular rhythm and normal heart sounds.   Pulmonary/Chest: Effort normal. No respiratory distress. She has wheezes (mild bilateral expiratory wheezes). She has no rales. She exhibits no tenderness.  No tachypnea or increased work of breathing  Abdominal: Soft. Bowel sounds are normal. There is no tenderness. There is no rebound and no guarding.   Musculoskeletal: Normal range of motion. She exhibits no edema.  No calf tenderness  Lymphadenopathy:    She has no cervical adenopathy.  Neurological: She is alert and oriented to person, place, and time.  Skin: Skin is warm and dry. No rash noted.  Psychiatric: She has a normal mood and affect.    ED Course  Procedures (including critical care time) DIAGNOSTIC STUDIES: Oxygen Saturation is 98% on RA, normal by my interpretation.   COORDINATION OF CARE: 7:42 PM- Patient states that breathing improved after nebulizer treatment here.  Will give an albuterol inhaler and prescribe prednisone and Augmentin to treat infection. Have advised her take Mucinex for home use. Cautioned patient against decongestants other than regular Mucinex. Also advised her on the importance of taking prenatal vitamins. Patient advised of plan for treatment and patient agrees.  Medications  albuterol (PROVENTIL HFA;VENTOLIN HFA) 108 (90 BASE) MCG/ACT inhaler 2 puff (not administered)  predniSONE (DELTASONE) tablet 60 mg (not administered)  albuterol (PROVENTIL) (5 MG/ML) 0.5% nebulizer solution (5 mg  Given 07/18/13 1908)    Labs Review Labs Reviewed  PREGNANCY, URINE - Abnormal; Notable for the following:    Preg Test, Ur POSITIVE (*)    All other components within normal limits   Imaging Review No results found.  MDM   1. Bronchitis    Patient with no current suggestions of pulmonary embolus. There is no findings of pneumonia on clinical exam. I will off on chest x-ray this point given the fact that she is pregnant  I personally performed the services described in this documentation, which was scribed in my presence.  The recorded information has been reviewed and considered.    Rolan Bucco, MD 07/18/13 [phone]

## 2013-07-18 NOTE — ED Notes (Signed)
Reports that she has had no prenatal care.

## 2013-07-22 ENCOUNTER — Encounter (HOSPITAL_COMMUNITY): Payer: Self-pay | Admitting: *Deleted

## 2013-07-22 ENCOUNTER — Inpatient Hospital Stay (HOSPITAL_COMMUNITY)
Admission: AD | Admit: 2013-07-22 | Discharge: 2013-07-22 | Disposition: A | Discharge status: Home / Self Care | Payer: Medicaid Other | Source: Ambulatory Visit | Attending: Obstetrics & Gynecology | Admitting: Obstetrics & Gynecology

## 2013-07-22 DIAGNOSIS — O9989 Other specified diseases and conditions complicating pregnancy, childbirth and the puerperium: Secondary | ICD-10-CM

## 2013-07-22 DIAGNOSIS — N898 Other specified noninflammatory disorders of vagina: Secondary | ICD-10-CM

## 2013-07-22 DIAGNOSIS — N949 Unspecified condition associated with female genital organs and menstrual cycle: Secondary | ICD-10-CM | POA: Insufficient documentation

## 2013-07-22 DIAGNOSIS — O99891 Other specified diseases and conditions complicating pregnancy: Secondary | ICD-10-CM | POA: Insufficient documentation

## 2013-07-22 LAB — WET PREP, GENITAL: Trich, Wet Prep: NONE SEEN

## 2013-07-22 NOTE — MAU Provider Note (Signed)
Chief Complaint: Vaginal Discharge  First Provider Initiated Contact with Patient 07/22/13 2250     SUBJECTIVE HPI: [name] is a 22 y.o. G3P2002 at [redacted]w[redacted]d by LMP who presents with you could vaginal discharge x2 days. Last intercourse 2 days ago. Denies fever, chills, abdominal pain, vaginal bleeding, vaginal itching or odor. Has not started prenatal care.   Past Medical History  Diagnosis Date  . No pertinent past medical history   . Family history of varicose veins   . FHx: hypertension   . FHx: thyroid disease   . FHx: scoliosis   . H/O varicella   . Hx: UTI (urinary tract infection) 2012  . Postpartum eclampsia 08/2006  . Bacterial infection    OB History  Gravida Para Term Preterm AB SAB TAB Ectopic Multiple Living  3 2 2   0    0 2    # Outcome Date GA Lbr Len/2nd Weight Sex Delivery Anes PTL Lv  3 CUR           2 TRM 02/28/12 [redacted]w[redacted]d 09:16 / 00:27 2.965 kg (6 lb 8.6 oz) M SVD EPI  Y  1 TRM 08/2006 [redacted]w[redacted]d  2.948 kg (6 lb 8 oz) F SVD EPI  Y    Obstetric Comments  Hx of preeclampsia   Past Surgical History  Procedure Laterality Date  . No past surgeries     History   Social History  . Marital Status: Single    Spouse Name: N/A    Number of Children: N/A  . Years of Education: N/A   Occupational History  . Not on file.   Social History Main Topics  . Smoking status: Former Games developer  . Smokeless tobacco: Not on file  . Alcohol Use: No  . Drug Use: No  . Sexual Activity: Yes    Birth Control/ Protection: Injection     Comment: Depo started 02/29/12   Other Topics Concern  . Not on file   Social History Narrative  . No narrative on file   No current facility-administered medications on file prior to encounter.   Current Outpatient Prescriptions on File Prior to Encounter  Medication Sig Dispense Refill  . amoxicillin-clavulanate (AUGMENTIN) 875-125 MG per tablet Take 1 tablet by mouth 2 (two) times daily. One po bid x 7 days  14 tablet  0   No Known  Allergies  ROS: Pertinent items in HPI  OBJECTIVE Blood pressure 122/75, pulse 98, temperature 98 F (36.7 C), temperature source Oral, resp. rate 18, height 5' 5.5" (1.664 m), weight 71.215 kg (157 lb), last menstrual period 03/28/2013, SpO2 97.00%. GENERAL: Well-developed, well-nourished female in no acute distress.  HEENT: Normocephalic HEART: normal rate RESP: normal effort ABDOMEN: Soft, non-tender EXTREMITIES: Nontender, no edema NEURO: Alert and oriented SPECULUM EXAM: NEFG, physiologic discharge with scant mucus, normal odor, no blood noted, cervix clean BIMANUAL: cervix closed; uterus 16 week size, no adnexal tenderness or masses Fetal heart rate 161 by Doppler.  LAB RESULTS Results for orders placed during the hospital encounter of 07/22/13 (from the past 24 hour(s))  WET PREP, GENITAL     Status: Abnormal   Collection Time    07/22/13 10:52 PM      Result Value Range   Yeast Wet Prep HPF POC NONE SEEN  NONE SEEN   Trich, Wet Prep NONE SEEN  NONE SEEN   Clue Cells Wet Prep HPF POC FEW (*) NONE SEEN   WBC, Wet Prep HPF POC FEW (*) NONE SEEN  IMAGING No results found.  MAU COURSE  ASSESSMENT 1. Vaginal discharge in pregnancy in second trimester     PLAN Discharge home in stable condition. GC Chlamydia pending.     Follow-up Information   Follow up with [facility].      Follow up with [facility] [place] MATERNITY ADMISSIONS. (As needed in emergencies)    Contact information:   [address] [id number] [place] [address] [phone]       Medication List    STOP taking these medications       predniSONE 20 MG tablet  Commonly known as:  DELTASONE      TAKE these medications       albuterol 108 (90 BASE) MCG/ACT inhaler  Commonly known as:  PROVENTIL HFA;VENTOLIN HFA  Inhale 2 puffs into the lungs every 6 (six) hours as needed for wheezing.     amoxicillin-clavulanate 875-125 MG per tablet  Commonly  known as:  AUGMENTIN  Take 1 tablet by mouth 2 (two) times daily. One po bid x 7 days         [place], [place] 07/22/2013  11:40 PM

## 2013-07-22 NOTE — MAU Note (Signed)
Pt reports yesterday and today she has had a mucous discharge. Denies bleeding. Pt reports she is 16-[redacted] weeks pregnant. Denies pain

## 2013-07-23 LAB — GC/CHLAMYDIA PROBE AMP: CT Probe RNA: NEGATIVE

## 2013-08-11 LAB — CULTURE, OB URINE: Urine Culture, OB: NEGATIVE

## 2013-08-12 LAB — OB RESULTS CONSOLE RUBELLA ANTIBODY, IGM: Rubella: IMMUNE

## 2013-08-12 LAB — OB RESULTS CONSOLE HIV ANTIBODY (ROUTINE TESTING): HIV: NONREACTIVE

## 2013-08-12 LAB — OB RESULTS CONSOLE HGB/HCT, BLOOD
HEMATOCRIT: 38 %
HEMOGLOBIN: 12.7 g/dL

## 2013-08-12 LAB — OB RESULTS CONSOLE VARICELLA ZOSTER ANTIBODY, IGG: VARICELLA IGG: IMMUNE

## 2013-08-13 LAB — OB RESULTS CONSOLE GC/CHLAMYDIA
CHLAMYDIA, DNA PROBE: NEGATIVE
Gonorrhea: NEGATIVE

## 2013-08-19 LAB — OB RESULTS CONSOLE ANTIBODY SCREEN: ANTIBODY SCREEN: NEGATIVE

## 2013-08-19 LAB — OB RESULTS CONSOLE ABO/RH: RH TYPE: POSITIVE

## 2013-08-19 LAB — OB RESULTS CONSOLE HEPATITIS B SURFACE ANTIGEN: Hepatitis B Surface Ag: NEGATIVE

## 2013-09-04 ENCOUNTER — Encounter (HOSPITAL_BASED_OUTPATIENT_CLINIC_OR_DEPARTMENT_OTHER): Payer: Self-pay | Admitting: Emergency Medicine

## 2013-09-04 ENCOUNTER — Emergency Department (HOSPITAL_BASED_OUTPATIENT_CLINIC_OR_DEPARTMENT_OTHER)
Admission: EM | Admit: 2013-09-04 | Discharge: 2013-09-04 | Disposition: A | Discharge status: Home / Self Care | Payer: Medicaid Other | Attending: Emergency Medicine | Admitting: Emergency Medicine

## 2013-09-04 DIAGNOSIS — S335XXA Sprain of ligaments of lumbar spine, initial encounter: Secondary | ICD-10-CM | POA: Insufficient documentation

## 2013-09-04 DIAGNOSIS — S39012A Strain of muscle, fascia and tendon of lower back, initial encounter: Secondary | ICD-10-CM

## 2013-09-04 DIAGNOSIS — Z3490 Encounter for supervision of normal pregnancy, unspecified, unspecified trimester: Secondary | ICD-10-CM

## 2013-09-04 DIAGNOSIS — Y9389 Activity, other specified: Secondary | ICD-10-CM | POA: Insufficient documentation

## 2013-09-04 DIAGNOSIS — O99891 Other specified diseases and conditions complicating pregnancy: Secondary | ICD-10-CM | POA: Insufficient documentation

## 2013-09-04 DIAGNOSIS — Y92410 Unspecified street and highway as the place of occurrence of the external cause: Secondary | ICD-10-CM | POA: Insufficient documentation

## 2013-09-04 DIAGNOSIS — Z8619 Personal history of other infectious and parasitic diseases: Secondary | ICD-10-CM | POA: Insufficient documentation

## 2013-09-04 DIAGNOSIS — Z87891 Personal history of nicotine dependence: Secondary | ICD-10-CM | POA: Insufficient documentation

## 2013-09-04 DIAGNOSIS — Z87440 Personal history of urinary (tract) infections: Secondary | ICD-10-CM | POA: Insufficient documentation

## 2013-09-04 NOTE — ED Notes (Signed)
Joni Reining, RN Rapid Response RN informed of pt disposition
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
## 2013-09-04 NOTE — ED Notes (Signed)
Pt transported to ED Room 14 for OB monitoring

## 2013-09-04 NOTE — ED Notes (Signed)
MD at bedside. 

## 2013-09-04 NOTE — ED Notes (Signed)
Pt reports feeling "baby move"

## 2013-10-26 ENCOUNTER — Inpatient Hospital Stay (HOSPITAL_COMMUNITY)
Admission: AD | Admit: 2013-10-26 | Discharge: 2013-10-26 | Disposition: A | Discharge status: Home / Self Care | Payer: Medicaid Other | Source: Ambulatory Visit | Attending: Obstetrics and Gynecology | Admitting: Obstetrics and Gynecology

## 2013-10-26 ENCOUNTER — Encounter (HOSPITAL_COMMUNITY): Payer: Self-pay | Admitting: *Deleted

## 2013-10-26 DIAGNOSIS — O99891 Other specified diseases and conditions complicating pregnancy: Secondary | ICD-10-CM | POA: Insufficient documentation

## 2013-10-26 DIAGNOSIS — E860 Dehydration: Secondary | ICD-10-CM | POA: Insufficient documentation

## 2013-10-26 DIAGNOSIS — O4700 False labor before 37 completed weeks of gestation, unspecified trimester: Secondary | ICD-10-CM | POA: Insufficient documentation

## 2013-10-26 DIAGNOSIS — O09893 Supervision of other high risk pregnancies, third trimester: Secondary | ICD-10-CM

## 2013-10-26 HISTORY — DX: Cardiac murmur, unspecified: R01.1

## 2013-10-26 LAB — URINALYSIS, ROUTINE W REFLEX MICROSCOPIC
Protein, ur: NEGATIVE mg/dL
Urobilinogen, UA: 0.2 mg/dL (ref 0.0–1.0)

## 2013-10-26 LAB — URINE MICROSCOPIC-ADD ON

## 2013-10-26 LAB — FETAL FIBRONECTIN: Fetal Fibronectin: NEGATIVE

## 2013-10-26 NOTE — MAU Note (Signed)
Pt states she has been U/C's since 2330 yesterday which caused her to vomit.  Denies vaginal bleeding, discharge or ROM.  Good fetal movement.

## 2013-10-26 NOTE — Progress Notes (Signed)
Dr. Pennie Rushing called ON:[id number]. Saw FFN negative and u/a, will come see pt in MAU shortly.

## 2013-10-26 NOTE — MAU Provider Note (Signed)
History     CSN: [id number]  Arrival date and time: 10/26/13 6578   First Provider Initiated Contact with Patient 10/26/13 0914      Chief Complaint  Patient presents with  . Labor Eval   HPI the patient is a 22 year old, gravida 3, para 2, at 30 weeks and 2 days who presents complaining that she has been having contractions since 11 PM last night, but did not come in because she did not have a ride. He had her initial visit at [facility] at [redacted] weeks gestation, and has had one additional visit, but has failed to keep her 2 subsequent appointments. She has had 2 previous term deliveries. The most recent in April of 2013. She denies recent intercourse. She denies vaginal bleeding, increased vaginal discharge or loss of fluid. She had not eaten today on presentation and had had little to drink in the last 24 hours. She states that she was unable to sleep well last night because of contractions.   Past Medical History  Diagnosis Date  . No pertinent past medical history   . Family history of varicose veins   . FHx: hypertension   . FHx: thyroid disease   . FHx: scoliosis   . H/O varicella   . Hx: UTI (urinary tract infection) 2012  . Postpartum eclampsia 08/2006  . Bacterial infection   . Heart murmur     Past Surgical History  Procedure Laterality Date  . No past surgeries      Family History  Problem Relation Age of Onset  . Anemia Mother   . Depression Mother   . Hypertension Father   . Heart disease Maternal Grandmother   . Depression Maternal Grandmother   . Anesthesia problems Neg Hx   . Heart disease Paternal Grandfather     History  Substance Use Topics  . Smoking status: Former Games developer  . Smokeless tobacco: Not on file  . Alcohol Use: No    Allergies: No Known Allergies  Prescriptions prior to admission  Medication Sig Dispense Refill  . omeprazole (PRILOSEC OTC) 20 MG tablet Take 20 mg by mouth daily.      . Prenatal Vit-Fe Fumarate-FA  (PRENATAL MULTIVITAMIN) TABS tablet Take 1 tablet by mouth daily at 12 noon.        Review of Systems  Constitutional: Negative.   HENT: Negative.   Eyes: Negative.   Respiratory: Negative.   Cardiovascular: Negative.   Gastrointestinal: Positive for abdominal pain.       Contractions   Genitourinary: Negative.   Musculoskeletal: Negative.   Skin: Negative.   Neurological: Negative.   Endo/Heme/Allergies: Negative.    Physical Exam   Blood pressure 112/68, pulse 80, temperature 97.5 F (36.4 C), temperature source Oral, resp. rate 16, height 5' 5.5" (1.664 m), weight 160 lb (72.576 kg), last menstrual period 03/28/2013, SpO2 100.00%.  Physical Exam  Constitutional: She is oriented to person, place, and time. She appears well-developed and well-nourished.  HENT:  Head: Normocephalic and atraumatic.  Eyes: Conjunctivae and EOM are normal.  Neck: Normal range of motion. Neck supple.  Cardiovascular: Normal rate and regular rhythm.   Respiratory: Effort normal.  GI: Soft.  Genitourinary:  Cervix long and closed with an unengaged presenting part  Musculoskeletal: Normal range of motion.  Neurological: She is alert and oriented to person, place, and time.  Skin: Skin is warm and dry.    Fetal Monitor:  Reactive tracing Cat 1.  No contractions noted Results for  orders placed during the hospital encounter of 10/26/13 (from the past 24 hour(s))  URINALYSIS, ROUTINE W REFLEX MICROSCOPIC     Status: Abnormal   Collection Time    10/26/13  8:30 AM      Result Value Range   Color, Urine YELLOW  YELLOW   APPearance CLEAR  CLEAR   Specific Gravity, Urine 1.025  1.005 - 1.030   pH 6.0  5.0 - 8.0   Glucose, UA NEGATIVE  NEGATIVE mg/dL   Hgb urine dipstick TRACE (*) NEGATIVE   Bilirubin Urine NEGATIVE  NEGATIVE   Ketones, ur >80 (*) NEGATIVE mg/dL   Protein, ur NEGATIVE  NEGATIVE mg/dL   Urobilinogen, UA 0.2  0.0 - 1.0 mg/dL   Nitrite NEGATIVE  NEGATIVE   Leukocytes, UA NEGATIVE   NEGATIVE  URINE MICROSCOPIC-ADD ON     Status: Abnormal   Collection Time    10/26/13  8:30 AM      Result Value Range   Squamous Epithelial / LPF FEW (*) RARE   WBC, UA 0-2  <3 WBC/hpf   RBC / HPF 3-6  <3 RBC/hpf   Urine-Other MUCOUS PRESENT    FETAL FIBRONECTIN     Status: None   Collection Time    10/26/13  9:15 AM      Result Value Range   Fetal Fibronectin NEGATIVE  NEGATIVE    MAU Course  Procedures FFN= neg Oral hydration Pt asked to eat as soon as my exam was complete Reassessment when labs returned:  Pt was sleeping soundly lying on her left side.  When awakened she said she felt better.  Assessment and Plan  Intrauterine pregnancy at 30 weeks and 2 days  Mild, apparent dehydration, secondary to poor by mouth intake No evidence of preterm contractions or labor Inadequate prenatal care Patient non-compliance with scheduled appointments  Plan: The patient is instructed to increase her fluid and food intake She is given an appointment on Thursday, December 11 at 2:30 PM for followup and glucose testing. She is notified that should she not keep this appointment, she will be at risk for being asked to find a different provider for noncompliance with scheduled appointments. She is given printed and verbal instructions to call for preterm contractions, loss of fluid, bleeding or other pregnancy concerns.  [name] P 10/26/2013, 10:48 AM

## 2013-10-29 LAB — OB RESULTS CONSOLE RPR: RPR: NONREACTIVE

## 2013-10-29 LAB — OB RESULTS CONSOLE HGB/HCT, BLOOD: HEMOGLOBIN: 11.6 g/dL

## 2013-11-19 NOTE — L&D Delivery Note (Signed)
Delivery Note  After about a 30 minute 2nd stage, at 2122  a viable female was delivered via  (Presentation:LOA ).  APGAR:9/9  ; weight pending.  40 units of pitocin diluted in 1000cc LR was infused rapidly IV.  The placenta separated spontaneously and delivered via CCT and maternal pushing effort.  It was inspected and appears to be intact with a 3 VC.  There were the following complications:   Anesthesia: Epidural  Episiotomy: none Lacerations: none Suture Repair: n/a Est. Blood Loss (mL): 100  Mom to postpartum.  Baby to Couplet care / Skin to Skin  Delivery by Dr.Feeny under my supervision..Marland Kitchen

## 2013-11-20 LAB — CULTURE, OB URINE: Urine Culture, OB: NEGATIVE

## 2013-12-15 ENCOUNTER — Ambulatory Visit (INDEPENDENT_AMBULATORY_CARE_PROVIDER_SITE_OTHER): Payer: Medicaid Other | Admitting: Obstetrics & Gynecology

## 2013-12-15 ENCOUNTER — Encounter: Payer: Self-pay | Admitting: Obstetrics & Gynecology

## 2013-12-15 VITALS — BP 120/83 | Wt 174.4 lb

## 2013-12-15 DIAGNOSIS — O0930 Supervision of pregnancy with insufficient antenatal care, unspecified trimester: Secondary | ICD-10-CM

## 2013-12-15 DIAGNOSIS — O0933 Supervision of pregnancy with insufficient antenatal care, third trimester: Secondary | ICD-10-CM | POA: Insufficient documentation

## 2013-12-15 DIAGNOSIS — Z3493 Encounter for supervision of normal pregnancy, unspecified, third trimester: Secondary | ICD-10-CM

## 2013-12-15 LAB — POCT URINALYSIS DIP (DEVICE)
BILIRUBIN URINE: NEGATIVE
GLUCOSE, UA: NEGATIVE mg/dL
Hgb urine dipstick: NEGATIVE
Ketones, ur: NEGATIVE mg/dL
LEUKOCYTES UA: NEGATIVE
NITRITE: NEGATIVE
PH: 6 (ref 5.0–8.0)
Protein, ur: NEGATIVE mg/dL
Specific Gravity, Urine: >=1.03 (ref 1.005–1.030)
Urobilinogen, UA: 0.2 mg/dL (ref 0.0–1.0)

## 2013-12-15 LAB — OB RESULTS CONSOLE GBS: GBS: NEGATIVE

## 2013-12-15 LAB — OB RESULTS CONSOLE GC/CHLAMYDIA
Chlamydia: NEGATIVE
Gonorrhea: NEGATIVE

## 2013-12-15 NOTE — Addendum Note (Signed)
Addended by: Franchot MimesALFARO, [employer] on: 12/15/2013 01:42 PM   Modules accepted: Orders

## 2013-12-15 NOTE — Patient Instructions (Addendum)
Third Trimester of Pregnancy The third trimester is from week 29 through week 42, months 7 through 9. The third trimester is a time when the fetus is growing rapidly. At the end of the ninth month, the fetus is about 20 inches in length and weighs 6 10 pounds.  BODY CHANGES Your body goes through many changes during pregnancy. The changes vary from woman to woman.   Your weight will continue to increase. You can expect to gain 25 35 pounds (11 16 kg) by the end of the pregnancy.  You may begin to get stretch marks on your hips, abdomen, and breasts.  You may urinate more often because the fetus is moving lower into your pelvis and pressing on your bladder.  You may develop or continue to have heartburn as a result of your pregnancy.  You may develop constipation because certain hormones are causing the muscles that push waste through your intestines to slow down.  You may develop hemorrhoids or swollen, bulging veins (varicose veins).  You may have pelvic pain because of the weight gain and pregnancy hormones relaxing your joints between the bones in your pelvis. Back aches may result from over exertion of the muscles supporting your posture.  Your breasts will continue to grow and be tender. A yellow discharge may leak from your breasts called colostrum.  Your belly button may stick out.  You may feel short of breath because of your expanding uterus.  You may notice the fetus "dropping," or moving lower in your abdomen.  You may have a bloody mucus discharge. This usually occurs a few days to a week before labor begins.  Your cervix becomes thin and soft (effaced) near your due date. WHAT TO EXPECT AT YOUR PRENATAL EXAMS  You will have prenatal exams every 2 weeks until week 36. Then, you will have weekly prenatal exams. During a routine prenatal visit:  You will be weighed to make sure you and the fetus are growing normally.  Your blood pressure is taken.  Your abdomen will be  measured to track your baby's growth.  The fetal heartbeat will be listened to.  Any test results from the previous visit will be discussed.  You may have a cervical check near your due date to see if you have effaced. At around 36 weeks, your caregiver will check your cervix. At the same time, your caregiver will also perform a test on the secretions of the vaginal tissue. This test is to determine if a type of bacteria, Group B streptococcus, is present. Your caregiver will explain this further. Your caregiver may ask you:  What your birth plan is.  How you are feeling.  If you are feeling the baby move.  If you have had any abnormal symptoms, such as leaking fluid, bleeding, severe headaches, or abdominal cramping.  If you have any questions. Other tests or screenings that may be performed during your third trimester include:  Blood tests that check for low iron levels (anemia).  Fetal testing to check the health, activity level, and growth of the fetus. Testing is done if you have certain medical conditions or if there are problems during the pregnancy. FALSE LABOR You may feel small, irregular contractions that eventually go away. These are called Braxton Hicks contractions, or false labor. Contractions may last for hours, days, or even weeks before true labor sets in. If contractions come at regular intervals, intensify, or become painful, it is best to be seen by your caregiver.  SIGNS OF LABOR   Menstrual-like cramps.  Contractions that are 5 minutes apart or less.  Contractions that start on the top of the uterus and spread down to the lower abdomen and back.  A sense of increased pelvic pressure or back pain.  A watery or bloody mucus discharge that comes from the vagina. If you have any of these signs before the 37th week of pregnancy, call your caregiver right away. You need to go to the hospital to get checked immediately. HOME CARE INSTRUCTIONS   Avoid all  smoking, herbs, alcohol, and unprescribed drugs. These chemicals affect the formation and growth of the baby.  Follow your caregiver's instructions regarding medicine use. There are medicines that are either safe or unsafe to take during pregnancy.  Exercise only as directed by your caregiver. Experiencing uterine cramps is a good sign to stop exercising.  Continue to eat regular, healthy meals.  Wear a good support bra for breast tenderness.  Do not use hot tubs, steam rooms, or saunas.  Wear your seat belt at all times when driving.  Avoid raw meat, uncooked cheese, cat litter boxes, and soil used by cats. These carry germs that can cause birth defects in the baby.  Take your prenatal vitamins.  Try taking a stool softener (if your caregiver approves) if you develop constipation. Eat more high-fiber foods, such as fresh vegetables or fruit and whole grains. Drink plenty of fluids to keep your urine clear or pale yellow.  Take warm sitz baths to soothe any pain or discomfort caused by hemorrhoids. Use hemorrhoid cream if your caregiver approves.  If you develop varicose veins, wear support hose. Elevate your feet for 15 minutes, 3 4 times a day. Limit salt in your diet.  Avoid heavy lifting, wear low heal shoes, and practice good posture.  Rest a lot with your legs elevated if you have leg cramps or low back pain.  Visit your dentist if you have not gone during your pregnancy. Use a soft toothbrush to brush your teeth and be gentle when you floss.  A sexual relationship may be continued unless your caregiver directs you otherwise.  Do not travel far distances unless it is absolutely necessary and only with the approval of your caregiver.  Take prenatal classes to understand, practice, and ask questions about the labor and delivery.  Make a trial run to the hospital.  Pack your hospital bag.  Prepare the baby's nursery.  Continue to go to all your prenatal visits as directed  by your caregiver. SEEK MEDICAL CARE IF:  You are unsure if you are in labor or if your water has broken.  You have dizziness.  You have mild pelvic cramps, pelvic pressure, or nagging pain in your abdominal area.  You have persistent nausea, vomiting, or diarrhea.  You have a bad smelling vaginal discharge.  You have pain with urination. SEEK IMMEDIATE MEDICAL CARE IF:   You have a fever.  You are leaking fluid from your vagina.  You have spotting or bleeding from your vagina.  You have severe abdominal cramping or pain.  You have rapid weight loss or gain.  You have shortness of breath with chest pain.  You notice sudden or extreme swelling of your face, hands, ankles, feet, or legs.  You have not felt your baby move in over an hour.  You have severe headaches that do not go away with medicine.  You have vision changes. Document Released: 10/30/2001 Document Revised: 07/08/2013 Document Reviewed:   01/06/2013 Exit[employer] Patient Information 2014 [place], [place]. Secondhand Smoke Secondhand smoke is the smoke exhaled by smokers and the smoke given off by a burning cigarette, cigar, or pipe. When a cigarette is smoked, about half of the smoke is inhaled and exhaled by the smoker, and the other half floats around in the air. Exposure to secondhand smoke is also called involuntary smoking or passive smoking. People can be exposed to secondhand smoke in:   Homes.  Cars.  Workplaces.  Public places (bars, restaurants, other recreation sites). Exposure to secondhand smoke is hazardous.It contains more than 250 harmful chemicals, including at least 60 that can cause cancer. These chemicals include:  Arsenic, a heavy metal toxin.  Benzene, a chemical found in gasoline.  Beryllium, a toxic metal.  Cadmium, a metal used in batteries.  Chromium, a metallic element.  Ethylene oxide, a chemical used to sterilize medical devices.  Nickel, a metallic element.  Polonium  210, a chemical element that gives off radiation.  Vinyl chloride, a toxic substance used in the Building control [employer]orman[employer] of plastics. Nonsmoking spouses and family members of smokers have higher rates of cancer, heart disease, and serious respiratory illnesses than those not exposed to secondhand smoke.  Nicotine, a nicotine by-product called cotinine, carbon monoxide, and other evidence of secondhand smoke exposure have been found in the body fluids of nonsmokers exposed to secondhand smoke.  Living with a smoker may increase a nonsmoker's chances of developing lung cancer by 20 to 30 percent.  Secondhand smoke may increase the risk of breast cancer, nasal sinus cavity cancer, cervical cancer, bladder cancer, and nose and throat (nasopharyngeal) cancer in adults.  Secondhand smoke may increase the risk of heart disease by 25 to 30 percent. Children are especially at risk from secondhand smoke exposure. Children of smokers have higher rates of:  Pneumonia.  Asthma.  Smoking.  Bronchitis.  Colds.  Chronic cough.  Ear infections.  Tonsilitis.  School absences. Research suggests that exposure to secondhand smoke may cause leukemia, lymphoma, and brain tumors in children. Babies are three times more likely to die from sudden infant death syndrome (SIDS) if their mothers smoked during and after pregnancy. There is no safe level of exposure to secondhand smoke. Studies have shown that even low levels of exposure can be harmful. The only way to fully protect nonsmokers from secondhand smoke exposure is to completely eliminate smoking in indoor spaces. The best thing you can do for your own health and for your children's health is to stop smoking. You should stop as soon as possible. This is not easy, and you may fail several times at quitting before you get free of this addiction. Nicotine replacement therapy ( such as patches, gum, or lozenges) can help. These therapies can help you deal with the  physical symptoms of withdrawal. Attending quit-smoking support groups can help you deal with the emotional issues of quitting smoking.  Even if you are not ready to quit right now, there are some simple changes you can make to reduce the effect of your smoking on your family:  Do not smoke in your home. Smoke away from your home in an open area, preferably outside.  Ask others to not smoke in your home.  Do not smoke while holding a child or when children are near.  Do not smoke in your car.  Avoid restaurants, day care centers, and other places that allow smoking. Document Released: 12/13/2004 Document Revised: 07/30/2012 Document Reviewed: 08/17/2009 [facility] Patient Information 2014 [place], [place].

## 2013-12-15 NOTE — Progress Notes (Signed)
Pulse: 84 Patient does not intend to breastfeed. Needs cultures today.

## 2013-12-15 NOTE — Progress Notes (Signed)
Subjective:    [name] is being seen today for her first obstetrical visit.  She is at 161w1d gestation. She is a transfer from [facility] at 36 weeks.  She was transferred for noncompliance.  She reports missing 1 visit and rescheduling 2 visits.  Her obstetrical history is significant for second hand smoke and a h/o preeclampsia in her first pregnancy.   . Relationship with FOB: significant other, living together. Patient does not intend to breast feed. Pregnancy history fully reviewed.  Menstrual History: OB History   Grav Para Term Preterm Abortions TAB SAB Ect Mult Living   3 2 2   0    0 2     Obstetric Comments   Hx of preeclampsia      Patient's last menstrual period was 03/28/2013.    The following portions of the patient's history were reviewed and updated as appropriate: allergies, current medications, past family history, past medical history, past social history, past surgical history and problem list.  Review of Systems A comprehensive review of systems was negative.    Objective:   GU: EGBUS: no lesions Vagina: no blood in vault Cervix: no lesion; no mucopurulent d/c 1cm/40%/vtx/mid position       Assessment:    Pregnancy at 37 and 1/7 weeks  Late transfer due to noncompliance- discussed with patient the importance of complance Reviewed risks of second hand smoke   Plan:    Initial labs drawn. Prenatal vitamins. Problem list reviewed and updated. Prev records reviewed had flu vac in Oct Follow up in 1 weeks. Declined Tdap vac  GBS and cx done

## 2013-12-16 LAB — GC/CHLAMYDIA PROBE AMP
CT Probe RNA: NEGATIVE
GC PROBE AMP APTIMA: NEGATIVE

## 2013-12-17 ENCOUNTER — Encounter: Payer: Self-pay | Admitting: *Deleted

## 2013-12-17 LAB — CANNABANOIDS (GC/LC/MS), URINE: THC-COOH (GC/LC/MS), ur confirm: 52 ng/mL — AB

## 2013-12-18 ENCOUNTER — Encounter: Payer: Self-pay | Admitting: Obstetrics & Gynecology

## 2013-12-18 LAB — CULTURE, BETA STREP (GROUP B ONLY)

## 2013-12-18 LAB — PRESCRIPTION MONITORING PROFILE (19 PANEL)
Amphetamine/Meth: NEGATIVE ng/mL
BUPRENORPHINE, URINE: NEGATIVE ng/mL
Barbiturate Screen, Urine: NEGATIVE ng/mL
Benzodiazepine Screen, Urine: NEGATIVE ng/mL
CREATININE, URINE: 174.17 mg/dL (ref >20.0)
Carisoprodol, Urine: NEGATIVE ng/mL
Cocaine Metabolites: NEGATIVE ng/mL
ECSTASY: NEGATIVE ng/mL
Fentanyl, Ur: NEGATIVE ng/mL
MEPERIDINE UR: NEGATIVE ng/mL
METHADONE SCREEN, URINE: NEGATIVE ng/mL
METHAQUALONE SCREEN (URINE): NEGATIVE ng/mL
NITRITES URINE, INITIAL: NEGATIVE ug/mL
OXYCODONE SCRN UR: NEGATIVE ng/mL
Opiate Screen, Urine: NEGATIVE ng/mL
PROPOXYPHENE: NEGATIVE ng/mL
Phencyclidine, Ur: NEGATIVE ng/mL
Tapentadol, urine: NEGATIVE ng/mL
Tramadol Scrn, Ur: NEGATIVE ng/mL
Zolpidem, Urine: NEGATIVE ng/mL
pH, Initial: 5.9 pH (ref 4.5–8.9)

## 2013-12-23 ENCOUNTER — Ambulatory Visit (INDEPENDENT_AMBULATORY_CARE_PROVIDER_SITE_OTHER): Payer: Medicaid Other | Admitting: Family Medicine

## 2013-12-23 VITALS — BP 106/76 | Temp 97.4°F | Wt 176.0 lb

## 2013-12-23 DIAGNOSIS — O0930 Supervision of pregnancy with insufficient antenatal care, unspecified trimester: Secondary | ICD-10-CM

## 2013-12-23 DIAGNOSIS — O0933 Supervision of pregnancy with insufficient antenatal care, third trimester: Secondary | ICD-10-CM

## 2013-12-23 LAB — POCT URINALYSIS DIP (DEVICE)
Bilirubin Urine: NEGATIVE
GLUCOSE, UA: NEGATIVE mg/dL
HGB URINE DIPSTICK: NEGATIVE
Nitrite: NEGATIVE
Protein, ur: NEGATIVE mg/dL
Specific Gravity, Urine: 1.025 (ref 1.005–1.030)
Urobilinogen, UA: 0.2 mg/dL (ref 0.0–1.0)
pH: 6 (ref 5.0–8.0)

## 2013-12-23 NOTE — Progress Notes (Signed)
S: 23 yo G3P2002 @ 5333w2d here for ROBv.  -no ctx, lof, vb. +FM  O: see flowsheet  A/P - doing well - measuring slightly small but still growing 34cm last week to 35 this week. Cont to follow.  Dating reviewed- by LMP c/w 20 wk US - labor precautions discussed

## 2013-12-23 NOTE — Patient Instructions (Signed)
Third Trimester of Pregnancy  The third trimester is from week 29 through week 42, months 7 through 9. The third trimester is a time when the fetus is growing rapidly. At the end of the ninth month, the fetus is about 20 inches in length and weighs 6 10 pounds.   BODY CHANGES  Your body goes through many changes during pregnancy. The changes vary from woman to woman.    Your weight will continue to increase. You can expect to gain 25 35 pounds (11 16 kg) by the end of the pregnancy.   You may begin to get stretch marks on your hips, abdomen, and breasts.   You may urinate more often because the fetus is moving lower into your pelvis and pressing on your bladder.   You may develop or continue to have heartburn as a result of your pregnancy.   You may develop constipation because certain hormones are causing the muscles that push waste through your intestines to slow down.   You may develop hemorrhoids or swollen, bulging veins (varicose veins).   You may have pelvic pain because of the weight gain and pregnancy hormones relaxing your joints between the bones in your pelvis. Back aches may result from over exertion of the muscles supporting your posture.   Your breasts will continue to grow and be tender. A yellow discharge may leak from your breasts called colostrum.   Your belly button may stick out.   You may feel short of breath because of your expanding uterus.   You may notice the fetus "dropping," or moving lower in your abdomen.   You may have a bloody mucus discharge. This usually occurs a few days to a week before labor begins.   Your cervix becomes thin and soft (effaced) near your due date.  WHAT TO EXPECT AT YOUR PRENATAL EXAMS   You will have prenatal exams every 2 weeks until week 36. Then, you will have weekly prenatal exams. During a routine prenatal visit:   You will be weighed to make sure you and the fetus are growing normally.   Your blood pressure is taken.   Your abdomen will be  measured to track your baby's growth.   The fetal heartbeat will be listened to.   Any test results from the previous visit will be discussed.   You may have a cervical check near your due date to see if you have effaced.  At around 36 weeks, your caregiver will check your cervix. At the same time, your caregiver will also perform a test on the secretions of the vaginal tissue. This test is to determine if a type of bacteria, Group B streptococcus, is present. Your caregiver will explain this further.  Your caregiver may ask you:   What your birth plan is.   How you are feeling.   If you are feeling the baby move.   If you have had any abnormal symptoms, such as leaking fluid, bleeding, severe headaches, or abdominal cramping.   If you have any questions.  Other tests or screenings that may be performed during your third trimester include:   Blood tests that check for low iron levels (anemia).   Fetal testing to check the health, activity level, and growth of the fetus. Testing is done if you have certain medical conditions or if there are problems during the pregnancy.  FALSE LABOR  You may feel small, irregular contractions that eventually go away. These are called Braxton Hicks contractions, or   false labor. Contractions may last for hours, days, or even weeks before true labor sets in. If contractions come at regular intervals, intensify, or become painful, it is best to be seen by your caregiver.   SIGNS OF LABOR    Menstrual-like cramps.   Contractions that are 5 minutes apart or less.   Contractions that start on the top of the uterus and spread down to the lower abdomen and back.   A sense of increased pelvic pressure or back pain.   A watery or bloody mucus discharge that comes from the vagina.  If you have any of these signs before the 37th week of pregnancy, call your caregiver right away. You need to go to the hospital to get checked immediately.  HOME CARE INSTRUCTIONS    Avoid all  smoking, herbs, alcohol, and unprescribed drugs. These chemicals affect the formation and growth of the baby.   Follow your caregiver's instructions regarding medicine use. There are medicines that are either safe or unsafe to take during pregnancy.   Exercise only as directed by your caregiver. Experiencing uterine cramps is a good sign to stop exercising.   Continue to eat regular, healthy meals.   Wear a good support bra for breast tenderness.   Do not use hot tubs, steam rooms, or saunas.   Wear your seat belt at all times when driving.   Avoid raw meat, uncooked cheese, cat litter boxes, and soil used by cats. These carry germs that can cause birth defects in the baby.   Take your prenatal vitamins.   Try taking a stool softener (if your caregiver approves) if you develop constipation. Eat more high-fiber foods, such as fresh vegetables or fruit and whole grains. Drink plenty of fluids to keep your urine clear or pale yellow.   Take warm sitz baths to soothe any pain or discomfort caused by hemorrhoids. Use hemorrhoid cream if your caregiver approves.   If you develop varicose veins, wear support hose. Elevate your feet for 15 minutes, 3 4 times a day. Limit salt in your diet.   Avoid heavy lifting, wear low heal shoes, and practice good posture.   Rest a lot with your legs elevated if you have leg cramps or low back pain.   Visit your dentist if you have not gone during your pregnancy. Use a soft toothbrush to brush your teeth and be gentle when you floss.   A sexual relationship may be continued unless your caregiver directs you otherwise.   Do not travel far distances unless it is absolutely necessary and only with the approval of your caregiver.   Take prenatal classes to understand, practice, and ask questions about the labor and delivery.   Make a trial run to the hospital.   Pack your hospital bag.   Prepare the baby's nursery.   Continue to go to all your prenatal visits as directed  by your caregiver.  SEEK MEDICAL CARE IF:   You are unsure if you are in labor or if your water has broken.   You have dizziness.   You have mild pelvic cramps, pelvic pressure, or nagging pain in your abdominal area.   You have persistent nausea, vomiting, or diarrhea.   You have a bad smelling vaginal discharge.   You have pain with urination.  SEEK IMMEDIATE MEDICAL CARE IF:    You have a fever.   You are leaking fluid from your vagina.   You have spotting or bleeding from your vagina.     You have severe abdominal cramping or pain.   You have rapid weight loss or gain.   You have shortness of breath with chest pain.   You notice sudden or extreme swelling of your face, hands, ankles, feet, or legs.   You have not felt your baby move in over an hour.   You have severe headaches that do not go away with medicine.   You have vision changes.  Document Released: 10/30/2001 Document Revised: 07/08/2013 Document Reviewed: 01/06/2013  Exit[employer] Patient Information 2014 [employer].

## 2013-12-23 NOTE — Progress Notes (Signed)
Pulse: 88

## 2013-12-31 ENCOUNTER — Encounter (HOSPITAL_COMMUNITY): Payer: Self-pay | Admitting: *Deleted

## 2013-12-31 ENCOUNTER — Ambulatory Visit (INDEPENDENT_AMBULATORY_CARE_PROVIDER_SITE_OTHER): Payer: Medicaid Other | Admitting: Obstetrics & Gynecology

## 2013-12-31 ENCOUNTER — Inpatient Hospital Stay (HOSPITAL_COMMUNITY): Payer: Medicaid Other | Admitting: Anesthesiology

## 2013-12-31 ENCOUNTER — Encounter (HOSPITAL_COMMUNITY): Payer: Medicaid Other | Admitting: Anesthesiology

## 2013-12-31 ENCOUNTER — Inpatient Hospital Stay (HOSPITAL_COMMUNITY)
Admission: AD | Admit: 2013-12-31 | Discharge: 2014-01-01 | DRG: 775 | Disposition: A | Discharge status: Home / Self Care | Payer: Medicaid Other | Source: Ambulatory Visit | Attending: Family Medicine | Admitting: Family Medicine

## 2013-12-31 VITALS — BP 109/78 | Temp 97.1°F | Wt 179.8 lb

## 2013-12-31 DIAGNOSIS — O99344 Other mental disorders complicating childbirth: Principal | ICD-10-CM | POA: Diagnosis present

## 2013-12-31 DIAGNOSIS — F1210 Cannabis abuse, uncomplicated: Secondary | ICD-10-CM

## 2013-12-31 DIAGNOSIS — Z87891 Personal history of nicotine dependence: Secondary | ICD-10-CM

## 2013-12-31 DIAGNOSIS — O0933 Supervision of pregnancy with insufficient antenatal care, third trimester: Secondary | ICD-10-CM

## 2013-12-31 DIAGNOSIS — O0930 Supervision of pregnancy with insufficient antenatal care, unspecified trimester: Secondary | ICD-10-CM

## 2013-12-31 DIAGNOSIS — O0990 Supervision of high risk pregnancy, unspecified, unspecified trimester: Secondary | ICD-10-CM

## 2013-12-31 DIAGNOSIS — IMO0001 Reserved for inherently not codable concepts without codable children: Secondary | ICD-10-CM

## 2013-12-31 LAB — CBC
HEMATOCRIT: 33.2 % — AB (ref 36.0–46.0)
HEMOGLOBIN: 11.5 g/dL — AB (ref 12.0–15.0)
MCH: 29.5 pg (ref 26.0–34.0)
MCHC: 34.6 g/dL (ref 30.0–36.0)
MCV: 85.1 fL (ref 78.0–100.0)
Platelets: 194 10*3/uL (ref 150–400)
RBC: 3.9 MIL/uL (ref 3.87–5.11)
RDW: 12.4 % (ref 11.5–15.5)
WBC: 7.2 10*3/uL (ref 4.0–10.5)

## 2013-12-31 LAB — POCT URINALYSIS DIP (DEVICE)
Bilirubin Urine: NEGATIVE
Glucose, UA: NEGATIVE mg/dL
Hgb urine dipstick: NEGATIVE
KETONES UR: NEGATIVE mg/dL
Leukocytes, UA: NEGATIVE
Nitrite: NEGATIVE
PROTEIN: 30 mg/dL — AB
Urobilinogen, UA: 0.2 mg/dL (ref 0.0–1.0)
pH: 6 (ref 5.0–8.0)

## 2013-12-31 LAB — RPR: RPR Ser Ql: NONREACTIVE

## 2013-12-31 MED ORDER — LACTATED RINGERS IV SOLN: 500.0000 mL | INTRAVENOUS | Status: DC | PRN

## 2013-12-31 MED ORDER — LACTATED RINGERS IV SOLN
500.0000 mL | Freq: Once | INTRAVENOUS | Status: AC
  Administered 2013-12-31: 1000 mL via INTRAVENOUS

## 2013-12-31 MED ORDER — PHENYLEPHRINE 40 MCG/ML (10ML) SYRINGE FOR IV PUSH (FOR BLOOD PRESSURE SUPPORT)
80.0000 ug | PREFILLED_SYRINGE | INTRAVENOUS | Status: DC | PRN
  Filled 2013-12-31: qty 10
  Filled 2013-12-31: qty 2

## 2013-12-31 MED ORDER — LIDOCAINE HCL (PF) 1 % IJ SOLN
INTRAMUSCULAR | Status: DC | PRN
  Administered 2013-12-31 (×2): 9 mL

## 2013-12-31 MED ORDER — ONDANSETRON HCL 4 MG/2ML IJ SOLN
4.0000 mg | INTRAMUSCULAR | Status: DC | PRN
  Administered 2014-01-01: 4 mg via INTRAVENOUS
  Filled 2013-12-31: qty 2

## 2013-12-31 MED ORDER — SENNOSIDES-DOCUSATE SODIUM 8.6-50 MG PO TABS: 2.0000 | ORAL_TABLET | ORAL | Status: DC

## 2013-12-31 MED ORDER — FENTANYL 2.5 MCG/ML BUPIVACAINE 1/10 % EPIDURAL INFUSION (WH - ANES)
INTRAMUSCULAR | Status: DC | PRN
  Administered 2013-12-31: 14 mL/h via EPIDURAL

## 2013-12-31 MED ORDER — IBUPROFEN 600 MG PO TABS
600.0000 mg | ORAL_TABLET | Freq: Four times a day (QID) | ORAL | Status: DC | PRN
  Administered 2013-12-31: 600 mg via ORAL
  Filled 2013-12-31: qty 1

## 2013-12-31 MED ORDER — DIPHENHYDRAMINE HCL 25 MG PO CAPS: 25.0000 mg | ORAL_CAPSULE | Freq: Four times a day (QID) | ORAL | Status: DC | PRN

## 2013-12-31 MED ORDER — ONDANSETRON HCL 4 MG/2ML IJ SOLN: 4.0000 mg | Freq: Four times a day (QID) | INTRAMUSCULAR | Status: DC | PRN

## 2013-12-31 MED ORDER — CITRIC ACID-SODIUM CITRATE 334-500 MG/5ML PO SOLN: 30.0000 mL | ORAL | Status: DC | PRN

## 2013-12-31 MED ORDER — FENTANYL 2.5 MCG/ML BUPIVACAINE 1/10 % EPIDURAL INFUSION (WH - ANES)
14.0000 mL/h | INTRAMUSCULAR | Status: DC | PRN
  Filled 2013-12-31: qty 125

## 2013-12-31 MED ORDER — DIBUCAINE 1 % RE OINT: 1.0000 "application " | TOPICAL_OINTMENT | RECTAL | Status: DC | PRN

## 2013-12-31 MED ORDER — FERROUS SULFATE 325 (65 FE) MG PO TABS
325.0000 mg | ORAL_TABLET | Freq: Two times a day (BID) | ORAL | Status: DC
  Administered 2014-01-01 (×2): 325 mg via ORAL
  Filled 2013-12-31 (×2): qty 1

## 2013-12-31 MED ORDER — MEASLES, MUMPS & RUBELLA VAC ~~LOC~~ INJ
0.5000 mL | INJECTION | Freq: Once | SUBCUTANEOUS | Status: DC
  Filled 2013-12-31: qty 0.5

## 2013-12-31 MED ORDER — PRENATAL MULTIVITAMIN CH
1.0000 | ORAL_TABLET | Freq: Every day | ORAL | Status: DC
  Administered 2014-01-01: 1 via ORAL
  Filled 2013-12-31: qty 1

## 2013-12-31 MED ORDER — OXYCODONE-ACETAMINOPHEN 5-325 MG PO TABS: 1.0000 | ORAL_TABLET | ORAL | Status: DC | PRN

## 2013-12-31 MED ORDER — SIMETHICONE 80 MG PO CHEW: 80.0000 mg | CHEWABLE_TABLET | ORAL | Status: DC | PRN

## 2013-12-31 MED ORDER — PHENYLEPHRINE 40 MCG/ML (10ML) SYRINGE FOR IV PUSH (FOR BLOOD PRESSURE SUPPORT)
80.0000 ug | PREFILLED_SYRINGE | INTRAVENOUS | Status: DC | PRN
  Filled 2013-12-31: qty 2

## 2013-12-31 MED ORDER — EPHEDRINE 5 MG/ML INJ
10.0000 mg | INTRAVENOUS | Status: DC | PRN
  Filled 2013-12-31: qty 2

## 2013-12-31 MED ORDER — BISACODYL 10 MG RE SUPP
10.0000 mg | Freq: Every day | RECTAL | Status: DC | PRN
Start: 2013-12-31 — End: 2014-01-02

## 2013-12-31 MED ORDER — DIPHENHYDRAMINE HCL 50 MG/ML IJ SOLN: 12.5000 mg | INTRAMUSCULAR | Status: DC | PRN

## 2013-12-31 MED ORDER — EPHEDRINE 5 MG/ML INJ
10.0000 mg | INTRAVENOUS | Status: DC | PRN
  Filled 2013-12-31: qty 4
  Filled 2013-12-31: qty 2

## 2013-12-31 MED ORDER — FLEET ENEMA 7-19 GM/118ML RE ENEM: 1.0000 | ENEMA | Freq: Every day | RECTAL | Status: DC | PRN

## 2013-12-31 MED ORDER — CALCIUM CARBONATE ANTACID 500 MG PO CHEW
1.0000 | CHEWABLE_TABLET | ORAL | Status: DC | PRN
  Administered 2014-01-01: 200 mg via ORAL
  Filled 2013-12-31 (×2): qty 1

## 2013-12-31 MED ORDER — OXYCODONE-ACETAMINOPHEN 5-325 MG PO TABS
1.0000 | ORAL_TABLET | ORAL | Status: DC | PRN
  Administered 2014-01-01 (×2): 1 via ORAL
  Filled 2013-12-31 (×2): qty 1

## 2013-12-31 MED ORDER — LACTATED RINGERS IV SOLN
INTRAVENOUS | Status: DC
  Administered 2013-12-31 (×2): via INTRAVENOUS

## 2013-12-31 MED ORDER — FLEET ENEMA 7-19 GM/118ML RE ENEM: 1.0000 | ENEMA | RECTAL | Status: DC | PRN

## 2013-12-31 MED ORDER — LIDOCAINE HCL (PF) 1 % IJ SOLN
30.0000 mL | INTRAMUSCULAR | Status: DC | PRN
  Filled 2013-12-31: qty 30

## 2013-12-31 MED ORDER — OXYTOCIN BOLUS FROM INFUSION
500.0000 mL | INTRAVENOUS | Status: DC
  Administered 2013-12-31: 500 mL via INTRAVENOUS

## 2013-12-31 MED ORDER — METHYLERGONOVINE MALEATE 0.2 MG/ML IJ SOLN
0.2000 mg | INTRAMUSCULAR | Status: DC | PRN
  Administered 2014-01-01: 0.2 mg via INTRAMUSCULAR
  Filled 2013-12-31: qty 1

## 2013-12-31 MED ORDER — OXYTOCIN 40 UNITS IN LACTATED RINGERS INFUSION - SIMPLE MED
62.5000 mL/h | INTRAVENOUS | Status: DC
  Filled 2013-12-31: qty 1000

## 2013-12-31 MED ORDER — PANTOPRAZOLE SODIUM 40 MG PO TBEC
40.0000 mg | DELAYED_RELEASE_TABLET | Freq: Every day | ORAL | Status: DC | PRN
  Administered 2014-01-01: 40 mg via ORAL
  Filled 2013-12-31: qty 1

## 2013-12-31 MED ORDER — ONDANSETRON HCL 4 MG PO TABS: 4.0000 mg | ORAL_TABLET | ORAL | Status: DC | PRN

## 2013-12-31 MED ORDER — OXYTOCIN 40 UNITS IN LACTATED RINGERS INFUSION - SIMPLE MED
62.5000 mL/h | INTRAVENOUS | Status: DC | PRN
  Administered 2014-01-01: 62.5 mL/h via INTRAVENOUS
  Filled 2013-12-31: qty 1000

## 2013-12-31 MED ORDER — METHYLERGONOVINE MALEATE 0.2 MG PO TABS: 0.2000 mg | ORAL_TABLET | ORAL | Status: DC | PRN

## 2013-12-31 MED ORDER — ZOLPIDEM TARTRATE 5 MG PO TABS: 5.0000 mg | ORAL_TABLET | Freq: Every evening | ORAL | Status: DC | PRN

## 2013-12-31 MED ORDER — LANOLIN HYDROUS EX OINT: TOPICAL_OINTMENT | CUTANEOUS | Status: DC | PRN

## 2013-12-31 MED ORDER — IBUPROFEN 600 MG PO TABS
600.0000 mg | ORAL_TABLET | Freq: Four times a day (QID) | ORAL | Status: DC
  Administered 2014-01-01 (×3): 600 mg via ORAL
  Filled 2013-12-31 (×3): qty 1

## 2013-12-31 MED ORDER — ACETAMINOPHEN 325 MG PO TABS: 650.0000 mg | ORAL_TABLET | ORAL | Status: DC | PRN

## 2013-12-31 MED ORDER — BENZOCAINE-MENTHOL 20-0.5 % EX AERO
1.0000 | INHALATION_SPRAY | CUTANEOUS | Status: DC | PRN
Start: 2013-12-31 — End: 2014-01-02
  Administered 2014-01-01: 1 via TOPICAL
  Filled 2013-12-31: qty 56

## 2013-12-31 MED ORDER — TETANUS-DIPHTH-ACELL PERTUSSIS 5-2.5-18.5 LF-MCG/0.5 IM SUSP
0.5000 mL | Freq: Once | INTRAMUSCULAR | Status: AC
  Administered 2014-01-01: 0.5 mL via INTRAMUSCULAR
  Filled 2013-12-31: qty 0.5

## 2013-12-31 MED ORDER — WITCH HAZEL-GLYCERIN EX PADS: 1.0000 "application " | MEDICATED_PAD | CUTANEOUS | Status: DC | PRN

## 2013-12-31 NOTE — Progress Notes (Signed)
UC q 10-15, no ROM, pos. Mucus d/c to MAU for labor evaluation

## 2013-12-31 NOTE — Progress Notes (Signed)
p-88 

## 2013-12-31 NOTE — Anesthesia Preprocedure Evaluation (Signed)
Anesthesia Evaluation  Patient identified by MRN, date of birth, ID band Patient awake    Reviewed: Allergy & Precautions, H&P , NPO status , Patient's Chart, lab work & pertinent test results  Airway Mallampati: I TM Distance: >3 FB Neck ROM: full    Dental no notable dental hx.    Pulmonary former smoker,    Pulmonary exam normal       Cardiovascular negative cardio ROS      Neuro/Psych negative neurological ROS  negative psych ROS   GI/Hepatic negative GI ROS, Neg liver ROS,   Endo/Other  negative endocrine ROS  Renal/GU negative Renal ROS  negative genitourinary   Musculoskeletal negative musculoskeletal ROS (+)   Abdominal Normal abdominal exam  (+)   Peds  Hematology   Anesthesia Other Findings   Reproductive/Obstetrics (+) Pregnancy                           Anesthesia Physical Anesthesia Plan  ASA: II  Anesthesia Plan: Epidural   Post-op Pain Management:    Induction:   Airway Management Planned:   Additional Equipment:   Intra-op Plan:   Post-operative Plan:   Informed Consent: I have reviewed the patients History and Physical, chart, labs and discussed the procedure including the risks, benefits and alternatives for the proposed anesthesia with the patient or authorized representative who has indicated his/her understanding and acceptance.     Plan Discussed with:   Anesthesia Plan Comments:         Anesthesia Quick Evaluation

## 2013-12-31 NOTE — MAU Note (Signed)
Pt reports having ctx since 10 am about 10 min apart reports some mucsy dischage but no bleeding o leaking. Good fetal movement reported.

## 2013-12-31 NOTE — Progress Notes (Signed)
   Corlis LeakMaranda L Lamay is a 23 y.o. G3P2002 at 5422w3d  admitted for active labor  Subjective: Comfortable with epidural   Objective: BP 112/80  Pulse 69  Temp(Src) 98.2 F (36.8 C) (Oral)  Resp 20  Ht 5' 5.5" (1.664 m)  Wt 81.647 kg (180 lb)  BMI 29.49 kg/m2  SpO2 98%  LMP 03/28/2013    FHT:  FHR: 140 bpm, variability: moderate,  accelerations:  Present,  decelerations:  Absent UC:   irregular, every 3-5 minutes since epidural SVE:   Dilation: 6 Effacement (%): 80 Station: -2 Exam by:: Dr. Richarda BladeAdamo AROM with clear fluid  Labs: Lab Results  Component Value Date   WBC 7.2 12/31/2013   HGB 11.5* 12/31/2013   HCT 33.2* 12/31/2013   MCV 85.1 12/31/2013   PLT 194 12/31/2013    Assessment / Plan: Spontaneous labor, progressing normally If labor doesn't pick up in the next hour or two, will augment Labor: Progressing normally Fetal Wellbeing:  Category I Pain Control:  Epidural Anticipated MOD:  NSVD  CRESENZO-DISHMAN,Berenise Hunton 12/31/2013, 6:14 PM

## 2013-12-31 NOTE — Anesthesia Procedure Notes (Signed)
Epidural Patient location during procedure: OB Start time: 12/31/2013 5:52 PM End time: 12/31/2013 5:56 PM  Staffing Anesthesiologist: Leilani AbleHATCHETT, Deloss Amico Performed by: anesthesiologist   Preanesthetic Checklist Completed: patient identified, surgical consent, pre-op evaluation, timeout performed, IV checked, risks and benefits discussed and monitors and equipment checked  Epidural Patient position: sitting Prep: site prepped and draped and DuraPrep Patient monitoring: continuous pulse ox and blood pressure Approach: midline Injection technique: LOR air  Needle:  Needle type: Tuohy  Needle gauge: 17 G Needle length: 9 cm and 9 Needle insertion depth: 5 cm cm Catheter type: closed end flexible Catheter size: 19 Gauge Catheter at skin depth: 10 cm Test dose: negative and Other  Assessment Sensory level: T9 Events: blood not aspirated, injection not painful, no injection resistance, negative IV test and no paresthesia  Additional Notes Reason for block:procedure for pain

## 2013-12-31 NOTE — MAU Note (Signed)
23 yo, G3P2 at 3452w3d, presents to MAU for labor evaluation. Reports +FM; denies VB, LOF. Reports contractions about 10 minutes since 1000 today. Prior vag deliveries.

## 2013-12-31 NOTE — H&P (Signed)
Corlis LeakMaranda L Mages is a 23 y.o. female presenting for painful contractions starting this morning. Pt presented to clinic today and was making some cervical change with contractions about every 15 minutes, she was sent to MAU for further eval. She denies bleeding, LOF. Good fetal movement. . Maternal Medical History:  Reason for admission: Contractions.   Contractions: Onset was 6-12 hours ago.   Frequency: irregular.   Duration is approximately 1 minute.   Perceived severity is moderate.    Fetal activity: Perceived fetal activity is normal.   Last perceived fetal movement was within the past hour.    Prenatal complications: No bleeding.     OB History   Grav Para Term Preterm Abortions TAB SAB Ect Mult Living   3 2 2   0    0 2     Obstetric Comments   Hx of preeclampsia     Past Medical History  Diagnosis Date  . No pertinent past medical history   . Family history of varicose veins   . FHx: hypertension   . FHx: thyroid disease   . FHx: scoliosis   . H/O varicella   . Hx: UTI (urinary tract infection) 2012  . Postpartum eclampsia 08/2006  . Bacterial infection   . Heart murmur    Past Surgical History  Procedure Laterality Date  . No past surgeries     Family History: family history includes Anemia in her mother; Depression in her maternal grandmother and mother; Heart disease in her maternal grandmother and paternal grandfather; Hypertension in her father. There is no history of Anesthesia problems. Social History:  reports that she has quit smoking. She does not have any smokeless tobacco history on file. She reports that she does not drink alcohol or use illicit drugs.   Prenatal Transfer Tool  Maternal Diabetes: No Genetic Screening: Normal Maternal Ultrasounds/Referrals: Normal Fetal Ultrasounds or other Referrals:  None Maternal Substance Abuse:  Yes:  Type: Marijuana Significant Maternal Medications:  None Significant Maternal Lab Results:  Lab values include:  Group B Strep negative Other Comments:  None  Review of Systems  All other systems reviewed and are negative.    Dilation: 5.5 Effacement (%): 80 Station: -1 Exam by:: L. Paschal, RN Blood pressure 105/75, pulse 77, temperature 98.6 F (37 C), temperature source Oral, resp. rate 18, height 5' 5.5" (1.664 m), weight 81.647 kg (180 lb), last menstrual period 03/28/2013. Maternal Exam:  Uterine Assessment: Contraction strength is moderate.  Contraction duration is 100 seconds. Contraction frequency is irregular.   Abdomen: Fetal presentation: vertex  Introitus: Normal vulva. Normal vagina.  Vagina is negative for discharge.  Pelvis: adequate for delivery.   Cervix: Cervix evaluated by digital exam.     Fetal Exam Fetal Monitor Review: Mode: ultrasound.   Baseline rate: 130.  Variability: moderate (6-25 bpm).   Pattern: accelerations present and no decelerations.    Fetal State Assessment: Category I - tracings are normal.     Physical Exam  Nursing note and vitals reviewed. Constitutional: She is oriented to person, place, and time. She appears well-developed and well-nourished. No distress.  HENT:  Head: Normocephalic and atraumatic.  Eyes: Conjunctivae are normal. Right eye exhibits no discharge. Left eye exhibits no discharge. No scleral icterus.  Cardiovascular: Normal rate.   Respiratory: Effort normal.  GI: There is no tenderness.  Genitourinary: Vagina normal and uterus normal. No vaginal discharge found.  Musculoskeletal: She exhibits no edema and no tenderness.  Neurological: She is alert and oriented  to person, place, and time.  Skin: Skin is warm and dry. No rash noted. She is not diaphoretic.  Psychiatric: She has a normal mood and affect. Her behavior is normal.    Prenatal labs: ABO, Rh:   Antibody:   Rubella: Immune (09/24 0000) RPR: Nonreactive (12/11 0000)  HBsAg:    HIV: Non-reactive (09/24 0000)  GBS: Negative (01/27 0000)    Assessment/Plan: Labor Plan #Labor: infrequent contractions since arrival, AROM at ~6pm #Pain: epidural in place #FWB: category 1 #ID: GBS neg     Beverely Low 12/31/2013, 4:12 PM    I have seen and examined this patient and agree with above documentation in the resident's note. 23 yo G3P2002 @ [redacted]w[redacted]d who presents with contractions and found to be in early labor with exam of 5/80/-1.  Admitted to L&D with routine orders.  AROM performed as above with return of clear fluid. Anticipate SVD.    Rulon Abide, M.D. [facility] Fellow 12/31/2013 7:21 PM

## 2013-12-31 NOTE — Patient Instructions (Signed)

## 2014-01-01 LAB — CBC
HEMATOCRIT: 26.8 % — AB (ref 36.0–46.0)
Hemoglobin: 9.2 g/dL — ABNORMAL LOW (ref 12.0–15.0)
MCH: 29.2 pg (ref 26.0–34.0)
MCHC: 34.3 g/dL (ref 30.0–36.0)
MCV: 85.1 fL (ref 78.0–100.0)
PLATELETS: 167 10*3/uL (ref 150–400)
RBC: 3.15 MIL/uL — ABNORMAL LOW (ref 3.87–5.11)
RDW: 12.5 % (ref 11.5–15.5)
WBC: 10 10*3/uL (ref 4.0–10.5)

## 2014-01-01 MED ORDER — IBUPROFEN 600 MG PO TABS: 600.0000 mg | ORAL_TABLET | Freq: Four times a day (QID) | ORAL | Status: DC

## 2014-01-01 NOTE — Progress Notes (Signed)
Post Partum Day 1 Subjective: [name] is a 23 y.o. G3P3003 PPD#1 s/p SVD at 658w3d. She is doing well this morning with only c/o of 2/10 lower back pain that is well-controlled with Motrin and Percocet. She experienced some increased vaginal bleeding/clots around midnight and was given Methergine IM. She reports that the vaginal bleeding has decreased and is minimal this morning. She is ambulating well and voiding but no BM or flatus yet. She is currently bottle feeding. She plans for OP circumcision for her baby boy and Depo-Provera for contraception. She denies N/V, HA, fevers, dizziness, or SOB.      Objective: Blood pressure 107/69, pulse 65, temperature 98.5 F (36.9 C), temperature source Oral, resp. rate 18, height 5' 5.5" (1.664 m), weight 81.647 kg (180 lb), last menstrual period 03/28/2013, SpO2 100.00%, unknown if currently breastfeeding.  Physical Exam:  General: alert, cooperative and no distress Lochia: appropriate Uterine Fundus: firm Incision: N/A DVT Evaluation: No evidence of DVT seen on physical exam. Negative Homan's sign. No calf or ankle swelling.     Recent Labs  12/31/13 1555 01/01/14 0650  HGB 11.5* 9.2*  HCT 33.2* 26.8*    Assessment/Plan: [name] is a 23 y.o. G3P3003 PPD#1 s/p SVD at 148w3d. She is doing well postpartum with only c/o lower back pain that is well-controlled with medication. She experienced increased vaginal bleeding around midnight and was given Methergine IM. No issues with bleeding this morning. She is otherwise afebrile and asymptomatic.   Plan for discharge tomorrow. She is currently bottle feeding. Plans for OP circ and Depo-Provera.      LOS: 1 day   Stephania FragminMilan, Bennie-John, T 01/01/2014, 7:48 AM   I have seen and examined this patient and agree with above documentation in the PA student's note. Please see discharge summary from the same day  Rulon AbideKeli Chastelyn Athens, M.D. [facility] Fellow 01/01/2014 11:50 AM

## 2014-01-01 NOTE — Discharge Summary (Signed)
Obstetric Discharge Summary Reason for Admission: onset of labor Prenatal Procedures: none Intrapartum Procedures: spontaneous vaginal delivery Postpartum Procedures: none Complications-Operative and Postpartum: none Hemoglobin  Date Value Ref Range Status  01/01/2014 9.2* 12.0 - 15.0 g/dL Final     REPEATED TO VERIFY     DELTA CHECK NOTED  10/29/2013 11.6   Final     HCT  Date Value Ref Range Status  01/01/2014 26.8* 36.0 - 46.0 % Final  08/12/2013 38   Final    Physical Exam:  General: alert, cooperative and no distress Lochia: appropriate Uterine Fundus: firm Incision: N/A DVT Evaluation: No evidence of DVT seen on physical exam. Negative Homan's sign. No significant calf/ankle edema.  Delivery Note  After about a 30 minute 2nd stage, at 2122 a viable female was delivered via (Presentation:LOA ). APGAR:9/9 ;  40 units of pitocin diluted in 1000cc LR was infused rapidly IV. The placenta separated spontaneously and delivered via CCT and maternal pushing effort. It was inspected and appears to be intact with a 3 VC.  There were the following complications:  Anesthesia: Epidural  Episiotomy: none  Lacerations: none  Suture Repair: n/a  Est. Blood Loss (mL): 100  Mom to postpartum. Baby to Couplet care / Skin to Skin  Delivery by Dr.Feeny under my supervision.Marland Kitchen.  Hospital Course:  [name] is a 23 y.o. [phone] who presented for onset of labor at 4682w3d. She progressed well and delivered a viable female by SVD without complication. Postpartum care was uncomplicated. She is being discharged in stable condition. She is currently bottle feeding. She plans for Depo-Provera as contraception.      Discharge Diagnoses: Term Pregnancy-delivered  Discharge Information: Date: 01/01/2014 Activity: pelvic rest Diet: routine Medications: PNV, Ibuprofen and Iron Condition: stable Instructions: refer to practice specific booklet Discharge to: home  Follow-up Information   Follow up  with [facility]. Schedule an appointment as soon as possible for a visit in 4 weeks. (Postpartum f/u. )    Contact information:   [address] [place] [id number]       Newborn Data: Live born female  Birth Weight: 7 lb 15.7 oz (3620 g) APGAR: 9, 9  Home with mother.  [name], [name], [name] 01/01/2014, 10:10 AM  I have seen and examined this patient and agree with above documentation in the PA student's note.   [name], M.D. [facility] Fellow 01/01/2014 11:48 AM

## 2014-01-01 NOTE — H&P (Signed)
Attestation of Attending Supervision of Obstetric Fellow: Evaluation and management procedures were performed by the Obstetric Fellow under my supervision and collaboration.  I have reviewed the Obstetric Fellow's note and chart, and I agree with the management and plan.  Carleigh Buccieri, MD, FACOG Attending Obstetrician & Gynecologist Faculty Practice, [facility]   

## 2014-01-01 NOTE — Progress Notes (Signed)
UR completed 

## 2014-01-01 NOTE — Anesthesia Postprocedure Evaluation (Signed)
  Anesthesia Post-op Note  Patient: [name]  Procedure(s) Performed: * No procedures listed *  Patient Location: Mother/Baby  Anesthesia Type:Epidural  Level of Consciousness: awake, alert , oriented and patient cooperative  Airway and Oxygen Therapy: Patient Spontanous Breathing  Post-op Pain: mild  Post-op Assessment: Patient's Cardiovascular Status Stable and Respiratory Function Stable  Post-op Vital Signs: stable  Complications: No apparent anesthesia complications

## 2014-01-01 NOTE — Progress Notes (Signed)
Baby patient status explained to parents. They expressed understanding

## 2014-01-01 NOTE — Discharge Summary (Signed)
Attestation of Attending Supervision of Obstetric Fellow: Evaluation and management procedures were performed by the Obstetric Fellow under my supervision and collaboration.  I have reviewed the Obstetric Fellow's note and chart, and I agree with the management and plan.  Haille Pardi, MD, FACOG Attending Obstetrician & Gynecologist Faculty Practice, [facility]   

## 2014-01-01 NOTE — Discharge Instructions (Signed)

## 2014-01-02 NOTE — Progress Notes (Signed)
Clinical Social Work Department PSYCHOSOCIAL ASSESSMENT - MATERNAL/CHILD 01/02/2014  Patient:  [name],[name]  Account Number:  [id number]  Admit Date:  12/31/2013  Childs Name:   [name]    Clinical Social Worker:  Delvina Mizzell, LCSW   Date/Time:  01/02/2014 10:30 AM  Date Referred:  01/01/2014      Referred reason  Substance Abuse   Other referral source:    I:  FAMILY / HOME ENVIRONMENT Child's legal guardian:  PARENT  Guardian - Name Guardian - Age Guardian - Address  [name] 22 [address]  [place], [place] [address]  [name]  same as above   Other household support members/support persons Other support:   Reports adequate family support    II  PSYCHOSOCIAL DATA Information Source:    Financial and Community Resources Employment:   Financial resources:  Medicaid If Medicaid - County:   Other  WIC  Food Stamps   School / Grade:   Maternity Care Coordinator / Child Services Coordination / Early Interventions:  Cultural issues impacting care:    III  STRENGTHS Strengths  Adequate Resources  Supportive family/friends  Home prepared for Child (including basic supplies)   Strength comment:    IV  RISK FACTORS AND CURRENT PROBLEMS Current Problem:       V  SOCIAL WORK ASSESSMENT Acknowledged Social Work consult to assess mother's history of "marijuana use."  Mother was receptive to social work intervention.   She is a single parent with two other dependents ages 7 and 22months.   She and FOB cohabitate. Mother notes occasional use of marijuana during pregnancy to deal with the nausea.  She denies abuse of the drug or need for treatment.    She denies any other illicit drug use during pregnancy.  She was informed of the hospital's newborn drug screen policy.   Mother also reports no hx of mental illness.   FOB is employed and she is a stay at home mom.  No acute social concerns reported at this time. Mother informed of social work availability.       VI SOCIAL WORK PLAN Social Work Plan  No Barriers to Discharge   Type of pt/family education:   If child protective services report - county:   If child protective services report - date:   Information/referral to community resources comment:   Other social work plan:   Will continue to monitor drug screen     

## 2014-01-04 ENCOUNTER — Encounter: Payer: Self-pay | Admitting: Nurse Practitioner

## 2014-02-11 ENCOUNTER — Telehealth: Payer: Self-pay | Admitting: *Deleted

## 2014-02-11 ENCOUNTER — Ambulatory Visit: Payer: Medicaid Other | Admitting: Nurse Practitioner

## 2014-02-11 NOTE — Telephone Encounter (Signed)
Called patient's listed number.  Left message that patient missed appointment and to call the clinic to reschedule.

## 2014-07-31 ENCOUNTER — Emergency Department (HOSPITAL_COMMUNITY): Payer: Medicaid Other

## 2014-07-31 ENCOUNTER — Encounter (HOSPITAL_COMMUNITY): Payer: Self-pay | Admitting: Emergency Medicine

## 2014-07-31 ENCOUNTER — Inpatient Hospital Stay (HOSPITAL_COMMUNITY)
Admission: EM | Admit: 2014-07-31 | Discharge: 2014-08-06 | DRG: 917 | Disposition: A | Discharge status: Home / Self Care | Payer: Medicaid Other | Attending: Internal Medicine | Admitting: Internal Medicine

## 2014-07-31 DIAGNOSIS — N390 Urinary tract infection, site not specified: Secondary | ICD-10-CM | POA: Diagnosis present

## 2014-07-31 DIAGNOSIS — T40601A Poisoning by unspecified narcotics, accidental (unintentional), initial encounter: Principal | ICD-10-CM | POA: Diagnosis present

## 2014-07-31 DIAGNOSIS — F1910 Other psychoactive substance abuse, uncomplicated: Secondary | ICD-10-CM | POA: Diagnosis present

## 2014-07-31 DIAGNOSIS — O0933 Supervision of pregnancy with insufficient antenatal care, third trimester: Secondary | ICD-10-CM

## 2014-07-31 DIAGNOSIS — J154 Pneumonia due to other streptococci: Secondary | ICD-10-CM

## 2014-07-31 DIAGNOSIS — F1210 Cannabis abuse, uncomplicated: Secondary | ICD-10-CM | POA: Diagnosis present

## 2014-07-31 DIAGNOSIS — S36119A Unspecified injury of liver, initial encounter: Secondary | ICD-10-CM | POA: Diagnosis present

## 2014-07-31 DIAGNOSIS — J9600 Acute respiratory failure, unspecified whether with hypoxia or hypercapnia: Secondary | ICD-10-CM | POA: Diagnosis present

## 2014-07-31 DIAGNOSIS — R740 Nonspecific elevation of levels of transaminase and lactic acid dehydrogenase [LDH]: Secondary | ICD-10-CM

## 2014-07-31 DIAGNOSIS — Z8249 Family history of ischemic heart disease and other diseases of the circulatory system: Secondary | ICD-10-CM

## 2014-07-31 DIAGNOSIS — Z87440 Personal history of urinary (tract) infections: Secondary | ICD-10-CM

## 2014-07-31 DIAGNOSIS — R7401 Elevation of levels of liver transaminase levels: Secondary | ICD-10-CM | POA: Diagnosis present

## 2014-07-31 DIAGNOSIS — Z8674 Personal history of sudden cardiac arrest: Secondary | ICD-10-CM

## 2014-07-31 DIAGNOSIS — R7402 Elevation of levels of lactic acid dehydrogenase (LDH): Secondary | ICD-10-CM | POA: Diagnosis present

## 2014-07-31 DIAGNOSIS — R092 Respiratory arrest: Secondary | ICD-10-CM

## 2014-07-31 DIAGNOSIS — B1920 Unspecified viral hepatitis C without hepatic coma: Secondary | ICD-10-CM | POA: Diagnosis present

## 2014-07-31 DIAGNOSIS — Z3490 Encounter for supervision of normal pregnancy, unspecified, unspecified trimester: Secondary | ICD-10-CM

## 2014-07-31 DIAGNOSIS — R000 Tachycardia, unspecified: Secondary | ICD-10-CM | POA: Diagnosis present

## 2014-07-31 DIAGNOSIS — O9081 Anemia of the puerperium: Secondary | ICD-10-CM

## 2014-07-31 DIAGNOSIS — K7200 Acute and subacute hepatic failure without coma: Secondary | ICD-10-CM | POA: Diagnosis present

## 2014-07-31 DIAGNOSIS — E876 Hypokalemia: Secondary | ICD-10-CM | POA: Diagnosis present

## 2014-07-31 DIAGNOSIS — E872 Acidosis, unspecified: Secondary | ICD-10-CM | POA: Diagnosis present

## 2014-07-31 DIAGNOSIS — I469 Cardiac arrest, cause unspecified: Secondary | ICD-10-CM | POA: Diagnosis present

## 2014-07-31 DIAGNOSIS — R7309 Other abnormal glucose: Secondary | ICD-10-CM | POA: Diagnosis present

## 2014-07-31 DIAGNOSIS — T50904A Poisoning by unspecified drugs, medicaments and biological substances, undetermined, initial encounter: Secondary | ICD-10-CM | POA: Diagnosis present

## 2014-07-31 DIAGNOSIS — J9601 Acute respiratory failure with hypoxia: Secondary | ICD-10-CM

## 2014-07-31 DIAGNOSIS — R4182 Altered mental status, unspecified: Secondary | ICD-10-CM | POA: Diagnosis present

## 2014-07-31 DIAGNOSIS — Z79899 Other long term (current) drug therapy: Secondary | ICD-10-CM

## 2014-07-31 LAB — COMPREHENSIVE METABOLIC PANEL
ALBUMIN: 3.4 g/dL — AB (ref 3.5–5.2)
ALT: 257 U/L — ABNORMAL HIGH (ref 0–35)
ANION GAP: 16 — AB (ref 5–15)
AST: 194 U/L — ABNORMAL HIGH (ref 0–37)
Alkaline Phosphatase: 95 U/L (ref 39–117)
BUN: 11 mg/dL (ref 6–23)
CO2: 19 mEq/L (ref 19–32)
CREATININE: 0.8 mg/dL (ref 0.50–1.10)
Calcium: 8 mg/dL — ABNORMAL LOW (ref 8.4–10.5)
Chloride: 104 mEq/L (ref 96–112)
GFR calc Af Amer: >90 mL/min (ref >90)
GFR calc non Af Amer: >90 mL/min (ref >90)
Glucose, Bld: 210 mg/dL — ABNORMAL HIGH (ref 70–99)
Potassium: 3.5 mEq/L — ABNORMAL LOW (ref 3.7–5.3)
Sodium: 139 mEq/L (ref 137–147)
TOTAL PROTEIN: 6.7 g/dL (ref 6.0–8.3)
Total Bilirubin: 0.3 mg/dL (ref 0.3–1.2)

## 2014-07-31 LAB — PROTIME-INR
INR: 1.09 (ref 0.00–1.49)
Prothrombin Time: 14.1 seconds (ref 11.6–15.2)

## 2014-07-31 LAB — I-STAT TROPONIN, ED: TROPONIN I, POC: 0 ng/mL (ref 0.00–0.08)

## 2014-07-31 LAB — SALICYLATE LEVEL

## 2014-07-31 LAB — CBC WITH DIFFERENTIAL/PLATELET
BASOS PCT: 0 % (ref 0–1)
Basophils Absolute: 0.1 10*3/uL (ref 0.0–0.1)
EOS ABS: 0.4 10*3/uL (ref 0.0–0.7)
Eosinophils Relative: 3 % (ref 0–5)
HEMATOCRIT: 38.9 % (ref 36.0–46.0)
HEMOGLOBIN: 13 g/dL (ref 12.0–15.0)
Lymphocytes Relative: 40 % (ref 12–46)
Lymphs Abs: 4.9 10*3/uL — ABNORMAL HIGH (ref 0.7–4.0)
MCH: 29 pg (ref 26.0–34.0)
MCHC: 33.4 g/dL (ref 30.0–36.0)
MCV: 86.8 fL (ref 78.0–100.0)
MONO ABS: 0.7 10*3/uL (ref 0.1–1.0)
Monocytes Relative: 6 % (ref 3–12)
NEUTROS PCT: 51 % (ref 43–77)
Neutro Abs: 6.1 10*3/uL (ref 1.7–7.7)
Platelets: 278 10*3/uL (ref 150–400)
RBC: 4.48 MIL/uL (ref 3.87–5.11)
RDW: 13.2 % (ref 11.5–15.5)
WBC: 12.1 10*3/uL — ABNORMAL HIGH (ref 4.0–10.5)

## 2014-07-31 LAB — URINALYSIS, ROUTINE W REFLEX MICROSCOPIC
Bilirubin Urine: NEGATIVE
Glucose, UA: NEGATIVE mg/dL
Ketones, ur: NEGATIVE mg/dL
Leukocytes, UA: NEGATIVE
Nitrite: NEGATIVE
PH: 5 (ref 5.0–8.0)
PROTEIN: 30 mg/dL — AB
Specific Gravity, Urine: 1.02 (ref 1.005–1.030)
Urobilinogen, UA: 0.2 mg/dL (ref 0.0–1.0)

## 2014-07-31 LAB — URINE MICROSCOPIC-ADD ON

## 2014-07-31 LAB — I-STAT ARTERIAL BLOOD GAS, ED
Acid-base deficit: 2 mmol/L (ref 0.0–2.0)
BICARBONATE: 23.1 meq/L (ref 20.0–24.0)
O2 Saturation: 100 %
PH ART: 7.372 (ref 7.350–7.450)
TCO2: 24 mmol/L (ref 0–100)
pCO2 arterial: 39.8 mmHg (ref 35.0–45.0)
pO2, Arterial: 278 mmHg — ABNORMAL HIGH (ref 80.0–100.0)

## 2014-07-31 LAB — I-STAT CHEM 8, ED
BUN: 10 mg/dL (ref 6–23)
CHLORIDE: 106 meq/L (ref 96–112)
Calcium, Ion: 1.05 mmol/L — ABNORMAL LOW (ref 1.12–1.23)
Creatinine, Ser: 0.9 mg/dL (ref 0.50–1.10)
Glucose, Bld: 214 mg/dL — ABNORMAL HIGH (ref 70–99)
HEMATOCRIT: 41 % (ref 36.0–46.0)
Hemoglobin: 13.9 g/dL (ref 12.0–15.0)
Potassium: 3.2 mEq/L — ABNORMAL LOW (ref 3.7–5.3)
Sodium: 139 mEq/L (ref 137–147)
TCO2: 20 mmol/L (ref 0–100)

## 2014-07-31 LAB — I-STAT CG4 LACTIC ACID, ED: LACTIC ACID, VENOUS: 3.74 mmol/L — AB (ref 0.5–2.2)

## 2014-07-31 LAB — RAPID URINE DRUG SCREEN, HOSP PERFORMED
AMPHETAMINES: NOT DETECTED
Barbiturates: NOT DETECTED
Benzodiazepines: POSITIVE — AB
Cocaine: NOT DETECTED
Opiates: POSITIVE — AB
Tetrahydrocannabinol: POSITIVE — AB

## 2014-07-31 LAB — APTT: APTT: 26 s (ref 24–37)

## 2014-07-31 LAB — ETHANOL

## 2014-07-31 LAB — ACETAMINOPHEN LEVEL

## 2014-07-31 LAB — CBG MONITORING, ED: GLUCOSE-CAPILLARY: 175 mg/dL — AB (ref 70–99)

## 2014-07-31 MED ORDER — SODIUM CHLORIDE 0.9 % IV SOLN
1.0000 ug/kg/min | INTRAVENOUS | Status: DC
  Administered 2014-07-31 – 2014-08-01 (×2): 1 ug/kg/min via INTRAVENOUS
  Filled 2014-07-31 (×2): qty 20

## 2014-07-31 MED ORDER — ROCURONIUM BROMIDE 50 MG/5ML IV SOLN
INTRAVENOUS | Status: AC
  Filled 2014-07-31: qty 2

## 2014-07-31 MED ORDER — ASPIRIN 300 MG RE SUPP: 300.0000 mg | RECTAL | Status: DC

## 2014-07-31 MED ORDER — NOREPINEPHRINE BITARTRATE 1 MG/ML IV SOLN
0.5000 ug/min | INTRAVENOUS | Status: DC
  Administered 2014-08-01: 5 ug/min via INTRAVENOUS
  Administered 2014-08-02 (×2): 10 ug/min via INTRAVENOUS
  Filled 2014-07-31 (×5): qty 4

## 2014-07-31 MED ORDER — MIDAZOLAM BOLUS VIA INFUSION
2.0000 mg | INTRAVENOUS | Status: DC | PRN
  Administered 2014-08-03: 2 mg via INTRAVENOUS
  Filled 2014-07-31: qty 2

## 2014-07-31 MED ORDER — SUCCINYLCHOLINE CHLORIDE 20 MG/ML IJ SOLN
INTRAMUSCULAR | Status: AC
  Filled 2014-07-31: qty 1

## 2014-07-31 MED ORDER — ETOMIDATE 2 MG/ML IV SOLN
INTRAVENOUS | Status: AC
  Filled 2014-07-31: qty 20

## 2014-07-31 MED ORDER — MIDAZOLAM HCL 2 MG/2ML IJ SOLN
INTRAMUSCULAR | Status: AC
  Filled 2014-07-31: qty 2

## 2014-07-31 MED ORDER — ETOMIDATE 2 MG/ML IV SOLN
INTRAVENOUS | Status: DC | PRN
  Administered 2014-07-31: 20 mg via INTRAVENOUS

## 2014-07-31 MED ORDER — MIDAZOLAM HCL 2 MG/2ML IJ SOLN
2.0000 mg | INTRAMUSCULAR | Status: DC | PRN
  Administered 2014-07-31 (×2): 2 mg via INTRAVENOUS
  Filled 2014-07-31: qty 2

## 2014-07-31 MED ORDER — CISATRACURIUM BOLUS VIA INFUSION
0.0500 mg/kg | INTRAVENOUS | Status: DC | PRN
  Filled 2014-07-31: qty 5

## 2014-07-31 MED ORDER — FENTANYL CITRATE 0.05 MG/ML IJ SOLN
INTRAMUSCULAR | Status: AC
  Administered 2014-07-31: 50 ug via INTRAVENOUS
  Filled 2014-07-31: qty 2

## 2014-07-31 MED ORDER — PROPOFOL 10 MG/ML IV EMUL: 5.0000 ug/kg/min | INTRAVENOUS | Status: DC

## 2014-07-31 MED ORDER — SUCCINYLCHOLINE CHLORIDE 20 MG/ML IJ SOLN
INTRAMUSCULAR | Status: DC | PRN
  Administered 2014-07-31: 150 mg via INTRAVENOUS

## 2014-07-31 MED ORDER — SODIUM CHLORIDE 0.9 % IV SOLN
25.0000 ug/h | INTRAVENOUS | Status: DC
  Administered 2014-07-31: 50 ug/h via INTRAVENOUS
  Administered 2014-08-01 – 2014-08-03 (×3): 200 ug/h via INTRAVENOUS
  Administered 2014-08-03: 300 ug/h via INTRAVENOUS
  Filled 2014-07-31 (×6): qty 50

## 2014-07-31 MED ORDER — PROPOFOL 10 MG/ML IV EMUL
5.0000 ug/kg/min | INTRAVENOUS | Status: DC
  Administered 2014-07-31: 25 ug/kg/min via INTRAVENOUS

## 2014-07-31 MED ORDER — FENTANYL BOLUS VIA INFUSION
50.0000 ug | INTRAVENOUS | Status: DC | PRN
  Administered 2014-08-01: 25 ug via INTRAVENOUS
  Filled 2014-07-31: qty 50

## 2014-07-31 MED ORDER — CISATRACURIUM BOLUS VIA INFUSION
0.1000 mg/kg | Freq: Once | INTRAVENOUS | Status: AC
  Administered 2014-07-31: 8.1 mg via INTRAVENOUS
  Filled 2014-07-31: qty 9

## 2014-07-31 MED ORDER — LIDOCAINE HCL (CARDIAC) 20 MG/ML IV SOLN
INTRAVENOUS | Status: AC
  Administered 2014-07-31: 100 mg
  Filled 2014-07-31: qty 5

## 2014-07-31 MED ORDER — SODIUM CHLORIDE 0.9 % IV SOLN
2000.0000 mL | Freq: Once | INTRAVENOUS | Status: AC
  Administered 2014-07-31: 2000 mL via INTRAVENOUS

## 2014-07-31 MED ORDER — FENTANYL CITRATE 0.05 MG/ML IJ SOLN
50.0000 ug | INTRAMUSCULAR | Status: DC | PRN
  Administered 2014-07-31 (×3): 50 ug via INTRAVENOUS
  Filled 2014-07-31: qty 2

## 2014-07-31 MED ORDER — MIDAZOLAM HCL 5 MG/ML IJ SOLN
1.0000 mg/h | INTRAMUSCULAR | Status: DC
  Administered 2014-07-31: 1 mg/h via INTRAVENOUS
  Administered 2014-08-01 – 2014-08-02 (×2): 4 mg/h via INTRAVENOUS
  Administered 2014-08-02: 9 mg/h via INTRAVENOUS
  Administered 2014-08-02: 4 mg/h via INTRAVENOUS
  Administered 2014-08-03: 6 mg/h via INTRAVENOUS
  Administered 2014-08-03: 8 mg/h via INTRAVENOUS
  Administered 2014-08-03 – 2014-08-04 (×2): 6 mg/h via INTRAVENOUS
  Filled 2014-07-31 (×8): qty 10

## 2014-07-31 NOTE — ED Notes (Signed)
MD at bedside. 

## 2014-07-31 NOTE — ED Provider Notes (Signed)
CSN: [id number]     Arrival date & time 07/31/14  2100 History   First MD Initiated Contact with Patient 07/31/14 2119     Chief Complaint  Patient presents with  . Drug Overdose    Patient is a 23 y.o. female presenting with Overdose. The history is provided by the EMS personnel.  Drug Overdose This is a new problem. The current episode started today. Treatments tried: narcan  x2, no relief.   Was with friends and walked out of bathroom with tourniquet around arm then became unresponsive. Friends said the patient was not breathing and had no pulses. EMS arrived and could not feel pulses. After 1 round of CPR, ROSC. Pt given narcan x2  without improvement in symptoms.    EMS and officers unclear of exact drug but have suggested heroin vs cocaine vs speed ball.      Past Medical History  Diagnosis Date  . No pertinent past medical history   . Family history of varicose veins   . FHx: hypertension   . FHx: thyroid disease   . FHx: scoliosis   . H/O varicella   . Hx: UTI (urinary tract infection) 2012  . Postpartum eclampsia 08/2006  . Bacterial infection   . Heart murmur    Past Surgical History  Procedure Laterality Date  . No past surgeries     Family History  Problem Relation Age of Onset  . Anemia Mother   . Depression Mother   . Hypertension Father   . Heart disease Maternal Grandmother   . Depression Maternal Grandmother   . Anesthesia problems Neg Hx   . Heart disease Paternal Grandfather    History  Substance Use Topics  . Smoking status: Former Games developer  . Smokeless tobacco: Not on file  . Alcohol Use: No   OB History   Grav Para Term Preterm Abortions TAB SAB Ect Mult Living   0 0 0 0 0 0 3     Obstetric Comments   Hx of preeclampsia     Review of Systems  Unable to perform ROS: Patient unresponsive    Allergies  Review of patient's allergies indicates no known allergies.  Home Medications   Prior to Admission medications   Medication  Sig Start Date End Date Taking? Authorizing Provider  ibuprofen (ADVIL,MOTRIN) 600 MG tablet Take 1 tablet (600 mg total) by mouth every 6 (six) hours. 01/01/14   Vale Haven, MD  Prenatal Vit-Fe Fumarate-FA (PRENATAL MULTIVITAMIN) TABS tablet Take 1 tablet by mouth daily at 12 noon.    Historical Provider, MD   BP 109/76  Pulse 84  Temp(Src) 98.2 F (36.8 C) (Core (Comment))  Resp 21  Ht  (1.676 m)  Wt 178 lb 9.2 oz (81.001 kg)  BMI 28.84 kg/m2  SpO2 100% Physical Exam  Nursing note and vitals reviewed. Constitutional: She appears well-developed and well-nourished. No distress.  Unresponsive.  HENT:  Head: Normocephalic and atraumatic.  Nose: Nose normal.  Eyes: Conjunctivae are normal.  Pupils 7mm equal and reactive  Neck: Neck supple. No tracheal deviation present.  Cardiovascular: Regular rhythm and normal heart sounds.   No murmur heard. Tachy 120bpm. Strong radial pulses  Pulmonary/Chest: Effort normal and breath sounds normal. No respiratory distress. She has no rales.  Agonal respirations  Abdominal: Soft. Bowel sounds are normal. She exhibits no distension and no mass. There is no tenderness.  Musculoskeletal: She exhibits no edema.  Neurological:  Unresponsive. GCS4-5.  Decerebrate  posturing of upper extremities noted.   Skin: Skin is warm and dry.    ED Course  INTUBATION Date/Time: 07/31/2014 9:34 PM Performed by: Sofie Rower Authorized by: Sofie Rower Consent: The procedure was performed in an emergent situation. Required items: required blood products, implants, devices, and special equipment available Indications: airway protection Intubation method: fiberoptic oral Preoxygenation: BVM Pretreatment medications: lidocaine Sedatives: etomidate Paralytic: succinylcholine Laryngoscope size: glidescope 3. Tube size: 7.5 mm Tube type: cuffed Number of attempts: 1 Cricoid pressure: no Cords visualized: yes Post-procedure assessment: chest rise and  ETCO2 monitor Breath sounds: equal and absent over the epigastrium Cuff inflated: yes ETT to teeth: 21 cm Tube secured with: ETT holder Chest x-ray interpreted by me and radiologist. Chest x-ray findings: endotracheal tube in appropriate position Patient tolerance: Patient tolerated the procedure well with no immediate complications.   (including critical care time) Labs Review Labs Reviewed  CBC WITH DIFFERENTIAL - Abnormal; Notable for the following:    WBC 12.1 (*)    Lymphs Abs 4.9 (*)    All other components within normal limits  COMPREHENSIVE METABOLIC PANEL - Abnormal; Notable for the following:    Potassium 3.5 (*)    Glucose, Bld 210 (*)    Calcium 8.0 (*)    Albumin 3.4 (*)    AST 194 (*)    ALT 257 (*)    Anion gap 16 (*)    All other components within normal limits  SALICYLATE LEVEL - Abnormal; Notable for the following:    Salicylate Lvl <2.0 (*)    All other components within normal limits  URINE RAPID DRUG SCREEN (HOSP PERFORMED) - Abnormal; Notable for the following:    Opiates POSITIVE (*)    Benzodiazepines POSITIVE (*)    Tetrahydrocannabinol POSITIVE (*)    All other components within normal limits  URINALYSIS, ROUTINE W REFLEX MICROSCOPIC - Abnormal; Notable for the following:    APPearance CLOUDY (*)    Hgb urine dipstick MODERATE (*)    Protein, ur 30 (*)    All other components within normal limits  URINE MICROSCOPIC-ADD ON - Abnormal; Notable for the following:    Bacteria, UA FEW (*)    All other components within normal limits  CBG MONITORING, ED - Abnormal; Notable for the following:    Glucose-Capillary 175 (*)    All other components within normal limits  I-STAT CG4 LACTIC ACID, ED - Abnormal; Notable for the following:    Lactic Acid, Venous 3.74 (*)    All other components within normal limits  I-STAT CHEM 8, ED - Abnormal; Notable for the following:    Potassium 3.2 (*)    Glucose, Bld 214 (*)    Calcium, Ion 1.05 (*)    All other  components within normal limits  I-STAT ARTERIAL BLOOD GAS, ED - Abnormal; Notable for the following:    pO2, Arterial 278.0 (*)    All other components within normal limits  ETHANOL  ACETAMINOPHEN LEVEL  APTT  PROTIME-INR  BLOOD GAS, ARTERIAL  CBG MONITORING, ED  I-STAT TROPOININ, ED    Imaging Review Ct Head Wo Contrast  07/31/2014   CLINICAL DATA:  Drug overdose.  Unresponsive.  EXAM: CT HEAD WITHOUT CONTRAST  TECHNIQUE: Contiguous axial images were obtained from the base of the skull through the vertex without intravenous contrast.  COMPARISON:  09/02/2006.  FINDINGS: The ventricles are normal in size and configuration. No extra-axial fluid collections are identified. The gray-white differentiation is normal. No CT findings for  acute intracranial process such as hemorrhage or infarction. No mass lesions. The brainstem and cerebellum are grossly normal.  The bony structures are intact. Scattered ethmoid sinus disease noted. There is a mucous retention cyst or polyp in the right maxillary sinus. The mastoid air cells and middle ear cavities are clear. The globes are intact.  IMPRESSION: No acute intracranial findings or mass lesion.   Electronically Signed   By: Loralie Champagne M.D.   On: 07/31/2014 22:03   Dg Chest Portable 1 View  07/31/2014   CLINICAL DATA:  Drug overdose.  EXAM: PORTABLE CHEST - 1 VIEW  COMPARISON:  05/19/2010  FINDINGS: The endotracheal tube is 3.5 cm above the carina. The NG tube is in the stomach. The cardiac silhouette, mediastinal and hilar contours are within normal limits given the supine position of the patient. The lungs are clear. No pleural effusion. The bony thorax is intact.  IMPRESSION: The endotracheal tube and NG tubes are in good position.  No acute pulmonary findings.   Electronically Signed   By: Loralie Champagne M.D.   On: 07/31/2014 21:37    MDM   Final diagnoses:  Respiratory arrest  Drug overdose, undetermined intent, initial encounter   Polysubstance abuse  Hypokalemia  Lactic acidosis    Post CPR with strong suspicion of overdose of unknown ingestant. Went unresponsive with tourniquet on arm in front of friends.  Pupils 7mm equal, reactive.  Agonal respirations, unable to protect airway.  Intubated emergently, pretreated with lidocaine for possible head injury (cocaine?). Posturing on exam, concern for brain injury.  FSBG wnl. IVF running open.  No signs of DVT, primary diagnosis of overdose more likely than PE.  ABG, screening labs sent.  + opiates, BZD and THC.  WAGMA, lactic acid. Hypothermia protocol initiated after ROSC s/p respiratory arrest.  Critical consulted, to admit.   Family arrived and said pt took a lot of Xanax before leaving the house.   11:11 PM VSS.      Sofie Rower, MD 08/01/14 [phone]

## 2014-07-31 NOTE — ED Notes (Addendum)
Per EMS: pt coming from a residence with c/o overdose. Pt's friend stated pt came out of bathroom with band around arm then collapsed. Fire found pt pulseless and apneic, CPR was started by fire at 2031, EMS placed pt on lucas, pulse back at 2038, pt was givne 2 mg or narcan, 5-7 minutes later pt was given another 2 mg narcan, cold saline started, cbg 216, Bp 150/88, HR 130

## 2014-07-31 NOTE — ED Notes (Signed)
Dr Gwendolyn Grant given lactic acid results 3.74

## 2014-07-31 NOTE — ED Notes (Signed)
Family at bedside. 

## 2014-08-01 ENCOUNTER — Inpatient Hospital Stay (HOSPITAL_COMMUNITY): Payer: Medicaid Other

## 2014-08-01 ENCOUNTER — Encounter (HOSPITAL_COMMUNITY): Payer: Self-pay | Admitting: Internal Medicine

## 2014-08-01 DIAGNOSIS — E876 Hypokalemia: Secondary | ICD-10-CM | POA: Diagnosis present

## 2014-08-01 DIAGNOSIS — Z79899 Other long term (current) drug therapy: Secondary | ICD-10-CM | POA: Diagnosis not present

## 2014-08-01 DIAGNOSIS — I469 Cardiac arrest, cause unspecified: Secondary | ICD-10-CM

## 2014-08-01 DIAGNOSIS — J9600 Acute respiratory failure, unspecified whether with hypoxia or hypercapnia: Secondary | ICD-10-CM | POA: Diagnosis present

## 2014-08-01 DIAGNOSIS — T40601A Poisoning by unspecified narcotics, accidental (unintentional), initial encounter: Secondary | ICD-10-CM | POA: Diagnosis present

## 2014-08-01 DIAGNOSIS — K7200 Acute and subacute hepatic failure without coma: Secondary | ICD-10-CM | POA: Diagnosis present

## 2014-08-01 DIAGNOSIS — R092 Respiratory arrest: Secondary | ICD-10-CM

## 2014-08-01 DIAGNOSIS — R4182 Altered mental status, unspecified: Secondary | ICD-10-CM | POA: Diagnosis present

## 2014-08-01 DIAGNOSIS — R000 Tachycardia, unspecified: Secondary | ICD-10-CM | POA: Diagnosis present

## 2014-08-01 DIAGNOSIS — Z8674 Personal history of sudden cardiac arrest: Secondary | ICD-10-CM | POA: Diagnosis not present

## 2014-08-01 DIAGNOSIS — F1910 Other psychoactive substance abuse, uncomplicated: Secondary | ICD-10-CM | POA: Diagnosis present

## 2014-08-01 DIAGNOSIS — R7309 Other abnormal glucose: Secondary | ICD-10-CM | POA: Diagnosis present

## 2014-08-01 DIAGNOSIS — R7401 Elevation of levels of liver transaminase levels: Secondary | ICD-10-CM | POA: Diagnosis present

## 2014-08-01 DIAGNOSIS — R0989 Other specified symptoms and signs involving the circulatory and respiratory systems: Secondary | ICD-10-CM

## 2014-08-01 DIAGNOSIS — E872 Acidosis, unspecified: Secondary | ICD-10-CM | POA: Diagnosis present

## 2014-08-01 DIAGNOSIS — F1210 Cannabis abuse, uncomplicated: Secondary | ICD-10-CM | POA: Diagnosis present

## 2014-08-01 DIAGNOSIS — Z8249 Family history of ischemic heart disease and other diseases of the circulatory system: Secondary | ICD-10-CM | POA: Diagnosis not present

## 2014-08-01 DIAGNOSIS — S36119A Unspecified injury of liver, initial encounter: Secondary | ICD-10-CM | POA: Diagnosis present

## 2014-08-01 DIAGNOSIS — N390 Urinary tract infection, site not specified: Secondary | ICD-10-CM | POA: Diagnosis present

## 2014-08-01 DIAGNOSIS — I369 Nonrheumatic tricuspid valve disorder, unspecified: Secondary | ICD-10-CM

## 2014-08-01 DIAGNOSIS — T50904A Poisoning by unspecified drugs, medicaments and biological substances, undetermined, initial encounter: Secondary | ICD-10-CM | POA: Diagnosis present

## 2014-08-01 LAB — BLOOD GAS, ARTERIAL
ACID-BASE DEFICIT: 8 mmol/L — AB (ref 0.0–2.0)
Bicarbonate: 16.9 mEq/L — ABNORMAL LOW (ref 20.0–24.0)
FIO2: 40 %
O2 SAT: 96.7 %
PATIENT TEMPERATURE: 91.4
PEEP: 5 cmH2O
RATE: 16 resp/min
TCO2: 18 mmol/L (ref 0–100)
VT: 500 mL
pCO2 arterial: 27.7 mmHg — ABNORMAL LOW (ref 35.0–45.0)
pH, Arterial: 7.379 (ref 7.350–7.450)
pO2, Arterial: 74.2 mmHg — ABNORMAL LOW (ref 80.0–100.0)

## 2014-08-01 LAB — BASIC METABOLIC PANEL
ANION GAP: 16 — AB (ref 5–15)
ANION GAP: 16 — AB (ref 5–15)
Anion gap: 14 (ref 5–15)
Anion gap: 14 (ref 5–15)
Anion gap: 17 — ABNORMAL HIGH (ref 5–15)
BUN: 8 mg/dL (ref 6–23)
BUN: 5 mg/dL — ABNORMAL LOW (ref 6–23)
BUN: 3 mg/dL — ABNORMAL LOW (ref 6–23)
BUN: 3 mg/dL — ABNORMAL LOW (ref 6–23)
CALCIUM: 7.8 mg/dL — AB (ref 8.4–10.5)
CHLORIDE: 101 meq/L (ref 96–112)
CHLORIDE: 104 meq/L (ref 96–112)
CO2: 19 mEq/L (ref 19–32)
CO2: 16 mEq/L — ABNORMAL LOW (ref 19–32)
CO2: 19 meq/L (ref 19–32)
CO2: 19 mEq/L (ref 19–32)
CO2: 17 mEq/L — ABNORMAL LOW (ref 19–32)
CREATININE: 0.56 mg/dL (ref 0.50–1.10)
CREATININE: 0.4 mg/dL — AB (ref 0.50–1.10)
CREATININE: 0.32 mg/dL — AB (ref 0.50–1.10)
CREATININE: 0.31 mg/dL — AB (ref 0.50–1.10)
CREATININE: 0.35 mg/dL — AB (ref 0.50–1.10)
Calcium: 7.8 mg/dL — ABNORMAL LOW (ref 8.4–10.5)
Calcium: 7.1 mg/dL — ABNORMAL LOW (ref 8.4–10.5)
Calcium: 7.7 mg/dL — ABNORMAL LOW (ref 8.4–10.5)
Calcium: 7.7 mg/dL — ABNORMAL LOW (ref 8.4–10.5)
Chloride: 105 mEq/L (ref 96–112)
Chloride: 104 mEq/L (ref 96–112)
Chloride: 104 mEq/L (ref 96–112)
GFR calc Af Amer: >90 mL/min (ref >90)
GFR calc Af Amer: >90 mL/min (ref >90)
GFR calc Af Amer: >90 mL/min (ref >90)
GFR calc Af Amer: >90 mL/min (ref >90)
GFR calc non Af Amer: >90 mL/min (ref >90)
GFR calc non Af Amer: >90 mL/min (ref >90)
GFR calc non Af Amer: >90 mL/min (ref >90)
GFR calc non Af Amer: >90 mL/min (ref >90)
GFR calc non Af Amer: >90 mL/min (ref >90)
GLUCOSE: 168 mg/dL — AB (ref 70–99)
Glucose, Bld: 112 mg/dL — ABNORMAL HIGH (ref 70–99)
Glucose, Bld: 176 mg/dL — ABNORMAL HIGH (ref 70–99)
Glucose, Bld: 163 mg/dL — ABNORMAL HIGH (ref 70–99)
Glucose, Bld: 159 mg/dL — ABNORMAL HIGH (ref 70–99)
Potassium: 3.8 mEq/L (ref 3.7–5.3)
Potassium: 3.4 mEq/L — ABNORMAL LOW (ref 3.7–5.3)
Potassium: 3.5 mEq/L — ABNORMAL LOW (ref 3.7–5.3)
Potassium: 3.7 mEq/L (ref 3.7–5.3)
Potassium: 3.7 mEq/L (ref 3.7–5.3)
SODIUM: 133 meq/L — AB (ref 137–147)
Sodium: 140 mEq/L (ref 137–147)
Sodium: 137 mEq/L (ref 137–147)
Sodium: 137 mEq/L (ref 137–147)
Sodium: 138 mEq/L (ref 137–147)

## 2014-08-01 LAB — BASIC METABOLIC PANEL WITH GFR
Anion gap: 13 (ref 5–15)
Anion gap: 12 (ref 5–15)
BUN: 4 mg/dL — ABNORMAL LOW (ref 6–23)
BUN: 3 mg/dL — ABNORMAL LOW (ref 6–23)
CO2: 19 meq/L (ref 19–32)
CO2: 20 meq/L (ref 19–32)
Calcium: 7.6 mg/dL — ABNORMAL LOW (ref 8.4–10.5)
Calcium: 7.7 mg/dL — ABNORMAL LOW (ref 8.4–10.5)
Chloride: 105 meq/L (ref 96–112)
Chloride: 105 meq/L (ref 96–112)
Creatinine, Ser: 0.35 mg/dL — ABNORMAL LOW (ref 0.50–1.10)
Creatinine, Ser: 0.33 mg/dL — ABNORMAL LOW (ref 0.50–1.10)
GFR calc Af Amer: >90 mL/min
GFR calc Af Amer: >90 mL/min
GFR calc non Af Amer: >90 mL/min
GFR calc non Af Amer: >90 mL/min
Glucose, Bld: 138 mg/dL — ABNORMAL HIGH (ref 70–99)
Glucose, Bld: 162 mg/dL — ABNORMAL HIGH (ref 70–99)
Potassium: 4 meq/L (ref 3.7–5.3)
Potassium: 3.4 meq/L — ABNORMAL LOW (ref 3.7–5.3)
Sodium: 137 meq/L (ref 137–147)
Sodium: 137 meq/L (ref 137–147)

## 2014-08-01 LAB — GLUCOSE, CAPILLARY
GLUCOSE-CAPILLARY: 151 mg/dL — AB (ref 70–99)
GLUCOSE-CAPILLARY: 139 mg/dL — AB (ref 70–99)
GLUCOSE-CAPILLARY: 140 mg/dL — AB (ref 70–99)
Glucose-Capillary: 113 mg/dL — ABNORMAL HIGH (ref 70–99)
Glucose-Capillary: 115 mg/dL — ABNORMAL HIGH (ref 70–99)
Glucose-Capillary: 140 mg/dL — ABNORMAL HIGH (ref 70–99)
Glucose-Capillary: 171 mg/dL — ABNORMAL HIGH (ref 70–99)
Glucose-Capillary: 169 mg/dL — ABNORMAL HIGH (ref 70–99)
Glucose-Capillary: 160 mg/dL — ABNORMAL HIGH (ref 70–99)
Glucose-Capillary: 167 mg/dL — ABNORMAL HIGH (ref 70–99)
Glucose-Capillary: 156 mg/dL — ABNORMAL HIGH (ref 70–99)

## 2014-08-01 LAB — POCT I-STAT, CHEM 8
BUN: <3 mg/dL — ABNORMAL LOW (ref 6–23)
CALCIUM ION: 1.14 mmol/L (ref 1.12–1.23)
CREATININE: 0.3 mg/dL — AB (ref 0.50–1.10)
Calcium, Ion: 1.02 mmol/L — ABNORMAL LOW (ref 1.12–1.23)
Chloride: 107 mEq/L (ref 96–112)
Chloride: 107 mEq/L (ref 96–112)
Creatinine, Ser: 0.2 mg/dL — ABNORMAL LOW (ref 0.50–1.10)
GLUCOSE: 190 mg/dL — AB (ref 70–99)
Glucose, Bld: 162 mg/dL — ABNORMAL HIGH (ref 70–99)
HCT: 42 % (ref 36.0–46.0)
HCT: 43 % (ref 36.0–46.0)
Hemoglobin: 14.3 g/dL (ref 12.0–15.0)
Hemoglobin: 14.6 g/dL (ref 12.0–15.0)
Potassium: 3.3 mEq/L — ABNORMAL LOW (ref 3.7–5.3)
Potassium: 3.5 mEq/L — ABNORMAL LOW (ref 3.7–5.3)
Sodium: 137 mEq/L (ref 137–147)
Sodium: 140 mEq/L (ref 137–147)
TCO2: 17 mmol/L (ref 0–100)
TCO2: 20 mmol/L (ref 0–100)

## 2014-08-01 LAB — MRSA PCR SCREENING: MRSA by PCR: POSITIVE — AB

## 2014-08-01 LAB — APTT: aPTT: 33 seconds (ref 24–37)

## 2014-08-01 LAB — HIV ANTIBODY (ROUTINE TESTING W REFLEX): HIV 1&2 Ab, 4th Generation: NONREACTIVE

## 2014-08-01 LAB — PROTIME-INR
INR: 1.1 (ref 0.00–1.49)
Prothrombin Time: 14.2 seconds (ref 11.6–15.2)

## 2014-08-01 LAB — HEMOGLOBIN A1C
Hgb A1c MFr Bld: 5.1 % (ref <5.7)
Mean Plasma Glucose: 100 mg/dL (ref <117)

## 2014-08-01 LAB — TROPONIN I: Troponin I: <0.3 ng/mL (ref <0.30)

## 2014-08-01 LAB — POC URINE PREG, ED: PREG TEST UR: NEGATIVE

## 2014-08-01 MED ORDER — MUPIROCIN 2 % EX OINT
1.0000 "application " | TOPICAL_OINTMENT | Freq: Two times a day (BID) | CUTANEOUS | Status: AC
  Administered 2014-08-01 – 2014-08-05 (×10): 1 via NASAL
  Filled 2014-08-01: qty 22

## 2014-08-01 MED ORDER — POTASSIUM CHLORIDE 10 MEQ/100ML IV SOLN
10.0000 meq | INTRAVENOUS | Status: AC
  Administered 2014-08-01 (×2): 10 meq via INTRAVENOUS
  Filled 2014-08-01 (×2): qty 100

## 2014-08-01 MED ORDER — SODIUM CHLORIDE 0.9 % IV SOLN: 250.0000 mL | INTRAVENOUS | Status: DC | PRN

## 2014-08-01 MED ORDER — POTASSIUM CHLORIDE 10 MEQ/100ML IV SOLN
10.0000 meq | INTRAVENOUS | Status: AC
  Administered 2014-08-01 (×4): 10 meq via INTRAVENOUS
  Filled 2014-08-01 (×4): qty 100

## 2014-08-01 MED ORDER — IPRATROPIUM-ALBUTEROL 0.5-2.5 (3) MG/3ML IN SOLN
3.0000 mL | Freq: Four times a day (QID) | RESPIRATORY_TRACT | Status: DC
  Administered 2014-08-01 – 2014-08-03 (×11): 3 mL via RESPIRATORY_TRACT
  Filled 2014-08-01 (×10): qty 3

## 2014-08-01 MED ORDER — CHLORHEXIDINE GLUCONATE CLOTH 2 % EX PADS
6.0000 | MEDICATED_PAD | Freq: Every day | CUTANEOUS | Status: AC
  Administered 2014-08-01 – 2014-08-05 (×5): 6 via TOPICAL

## 2014-08-01 MED ORDER — CETYLPYRIDINIUM CHLORIDE 0.05 % MT LIQD
7.0000 mL | Freq: Four times a day (QID) | OROMUCOSAL | Status: DC
  Administered 2014-08-01 – 2014-08-04 (×13): 7 mL via OROMUCOSAL

## 2014-08-01 MED ORDER — CHLORHEXIDINE GLUCONATE 0.12 % MT SOLN
15.0000 mL | Freq: Two times a day (BID) | OROMUCOSAL | Status: DC
  Administered 2014-08-01 – 2014-08-06 (×9): 15 mL via OROMUCOSAL
  Filled 2014-08-01 (×11): qty 15

## 2014-08-01 MED ORDER — PANTOPRAZOLE SODIUM 40 MG IV SOLR
40.0000 mg | Freq: Every day | INTRAVENOUS | Status: DC
  Administered 2014-08-01 – 2014-08-05 (×6): 40 mg via INTRAVENOUS
  Filled 2014-08-01 (×7): qty 40

## 2014-08-01 MED ORDER — ARTIFICIAL TEARS OP OINT
TOPICAL_OINTMENT | Freq: Three times a day (TID) | OPHTHALMIC | Status: DC
  Administered 2014-08-01: 14:00:00 via OPHTHALMIC
  Administered 2014-08-01 (×2): 1 via OPHTHALMIC
  Administered 2014-08-01 – 2014-08-02 (×3): via OPHTHALMIC
  Administered 2014-08-02: 1 via OPHTHALMIC
  Filled 2014-08-01: qty 3.5

## 2014-08-01 MED ORDER — INSULIN ASPART 100 UNIT/ML ~~LOC~~ SOLN
0.0000 [IU] | SUBCUTANEOUS | Status: DC
  Administered 2014-08-01 (×3): 3 [IU] via SUBCUTANEOUS
  Administered 2014-08-02 (×3): 2 [IU] via SUBCUTANEOUS
  Administered 2014-08-06: 3 [IU] via SUBCUTANEOUS

## 2014-08-01 MED ORDER — POTASSIUM CHLORIDE 10 MEQ/50ML IV SOLN
10.0000 meq | INTRAVENOUS | Status: AC
  Administered 2014-08-01 (×4): 10 meq via INTRAVENOUS
  Filled 2014-08-01: qty 50

## 2014-08-01 MED ORDER — HEPARIN SODIUM (PORCINE) 5000 UNIT/ML IJ SOLN
5000.0000 [IU] | Freq: Three times a day (TID) | INTRAMUSCULAR | Status: DC
  Administered 2014-08-01 – 2014-08-06 (×17): 5000 [IU] via SUBCUTANEOUS
  Filled 2014-08-01 (×19): qty 1

## 2014-08-01 NOTE — ED Provider Notes (Signed)
I saw and evaluated the patient, reviewed the resident's note and I agree with the findings and plan.   EKG Interpretation   Date/Time:  Saturday July 31 2014 21:35:52 EDT Ventricular Rate:  101 PR Interval:  132 QRS Duration: 80 QT Interval:  358 QTC Calculation: 464 R Axis:   81 Text Interpretation:  Sinus tachycardia Probable left atrial enlargement  similar to prior Confirmed by Gwendolyn Grant  MD, Terance Pomplun ([id number]) on 07/31/2014  11:49:19 PM      CRITICAL CARE Performed by: Dagmar Hait   Total critical care time: 40 minutes  Critical care time was exclusive of separately billable procedures and treating other patients.  Critical care was necessary to treat or prevent imminent or life-threatening deterioration.  Critical care was time spent personally by me on the following activities: development of treatment plan with patient and/or surrogate as well as nursing, discussions with consultants, evaluation of patient's response to treatment, examination of patient, obtaining history from patient or surrogate, ordering and performing treatments and interventions, ordering and review of laboratory studies, ordering and review of radiographic studies, pulse oximetry and re-evaluation of patient's condition.   Patient here after found down s/p drug overdose. Received CPR with EMS for about 4 minutes, ROSC returned, cooling began. Patient here apneic, posturing. I supervised intubation by Dr. Edman Circle. Patient's head CT normal. Admitted by Critical Care.  Elwin Mocha, MD 08/01/14 0111

## 2014-08-01 NOTE — Progress Notes (Signed)
Patient was transported to 2H10 from [facility] on Vent with no complications.

## 2014-08-01 NOTE — ED Notes (Signed)
Critical care at bedside. Cooling pads placed on patient

## 2014-08-01 NOTE — ED Notes (Signed)
Cooling pads delayed until critical care can see patient. Plan of care review with attending and resident

## 2014-08-01 NOTE — Progress Notes (Signed)
Visit was very brief. Nurse secretary informed Chaplain where family would be. Chaplain visited with some family members. Patient's parents came in and left immediately. Remaining family followed shortly after. Will follow as needed.  08/01/14 0100  Clinical Encounter Type  Visited With Family  Visit Type Initial  Referral From Nurse  Spiritual Encounters  Spiritual Needs Emotional  Annora Guderian, Mayer Masker, Chaplain 1:19 AM 08/01/2014

## 2014-08-01 NOTE — Progress Notes (Signed)
VASCULAR LAB PRELIMINARY  PRELIMINARY  PRELIMINARY  PRELIMINARY  Bilateral lower extremity venous Dopplers completed.    Preliminary report:  There is no DVT or SVT noted in the bilateral lower extremities.   Meli Faley, RVT 08/01/2014, 3:07 PM

## 2014-08-01 NOTE — Progress Notes (Signed)
Nasal MRSA swab positive, patient placed in Contact isolation. Order set initiated.

## 2014-08-01 NOTE — H&P (Signed)
PULMONARY  / CRITICAL CARE MEDICINE HISTORY AND PHYSICAL EXAMINATION  Name: [name] MRN: [id number] DOB: [birth year]    ADMISSION DATE:  07/31/2014  CHIEF COMPLAINT:  Cardiac arrest   BRIEF PATIENT DESCRIPTION: 23 year old with no significant past medical history presents via EMS being found pulseless after presumed OD.  SIGNIFICANT EVENTS / STUDIES:  1. 08/01/14 intubated in ED for airway protection  LINES / TUBES: 1. 08/01/14 PIVs 2. 08/01/14 Foley 3. 08/01/14 Left radial arterial line   CULTURES: 1. none  ANTIBIOTICS: 1. none  HISTORY OF PRESENT ILLNESS:   23 year old woman with history of possible polysubstance abuse, who went to a friend's house and was seen emerging from bathroom with tourniquet around her arm, followed by unresponsiveness and loss of pulse.  EMS did a round of CPR with ROSC, and administered narcan. It is unclear how long she was pulseless.  She was intubated in the ED and was noted to be posturing.  Per family/friends, she may have had some experience with heroin in the past.  Additionally, her mother reports that she had been severely depressed in the past few months due to multiple stressors.  She was living with her grandmother and taking care of her three children.  Per family, she denied having any recent headache, chest pain, abdominal symptoms, trouble breathing.      PAST MEDICAL HISTORY :  Past Medical History  Diagnosis Date  . No pertinent past medical history   . Family history of varicose veins   . FHx: hypertension   . FHx: thyroid disease   . FHx: scoliosis   . H/O varicella   . Hx: UTI (urinary tract infection) 2012  . Postpartum eclampsia 08/2006  . Bacterial infection   . Heart murmur     Past Surgical History  Procedure Laterality Date  . No past surgeries      MEDICATIONS: Prior to Admission medications   Medication Sig Start Date End Date Taking? Authorizing Provider  ALPRAZolam Prudy Feeler) 1 MG tablet Take 3 mg by mouth  once.    Historical Provider, MD    ALLERGIES: No Known Allergies  FAMILY HISTORY:  Family History  Problem Relation Age of Onset  . Anemia Mother   . Depression Mother   . Hypertension Father   . Heart disease Maternal Grandmother   . Depression Maternal Grandmother   . Anesthesia problems Neg Hx   . Heart disease Paternal Grandfather     SOCIAL HISTORY: Per family she is a nonsmoker. She does not have any smokeless tobacco history on file. Family reports she drinks ETOH occasionally and may have tried heroin, but the only recreational drug they know of her using regularly is Marijuana.  REVIEW OF SYSTEMS:  12 point systems could not be obtained due to patient's condition.  Family provided some ROS as above in HPI.  PHYSICAL EXAM:  Temp:  [94.3 F (34.6 C)-99 F (37.2 C)] 94.3 F (34.6 C) (09/13 0400) Pulse Rate:  [56-130] 62 (09/13 0415) Resp:  [10-25] 16 (09/13 0415) BP: (101-148)/(64-107) 122/77 mmHg (09/13 0415) SpO2:  [95 %-100 %] 99 % (09/13 0415) FiO2 (%):  [40 %-100 %] 40 % (09/13 0300) Weight:  [160 lb 11.5 oz (72.9 kg)-178 lb 9.2 oz (81.001 kg)] 160 lb 11.5 oz (72.9 kg) (09/13 0215)    VENTILATOR SETTINGS: Vent Mode:  [-] PRVC FiO2 (%):  [40 %-100 %] 40 % Set Rate:  [16 bmp] 16 bmp Vt Set:  [500  mL] 500 mL PEEP:  [5 cmH20] 5 cmH20 Plateau Pressure:  [20 cmH20] 20 cmH20  General:  Obese, appears stated age, intubated  Neuro:  Does not withdraw to pain.  Babinskis are downgoing. Pupils are dilated equally and react equally to light. HEENT:  Eyes anicteric. PERRL. OP unremarkable. Atraumatic, trachea midline.  Neck:  Supple, no lymphadenopathy Cardiovascular:  RRR, no murmurs /rubs/gallops Lungs: diffuse inspiratory and expiratory wheezing Abdomen:  Soft, nondistended. No tenderness illicited  Musculoskeletal:  No joint abnormalities. Muscle tone/bulk normal Skin:  No wounds or skin breakdown. No rashes.   Intake/Output     09/12 0701 - 09/13 0700    I.V. (mL/kg) 2204.5 (30.2)   IV Piggyback 100   Total Intake(mL/kg) 2304.5 (31.6)   Urine (mL/kg/hr) 675   Total Output 675   Net +1629.5         CLINICAL DECISION-MAKING:  CBC Recent Labs     07/31/14  2120  07/31/14  2128  WBC  12.1*   --   HGB  13.0  13.9  HCT  38.9  41.0  PLT  278   --     Coag's Recent Labs     07/31/14  2120  APTT  26  INR  1.09    BMET Recent Labs     07/31/14  2120  07/31/14  2128  NA  139  139  K  3.5*  3.2*  CL  104  106  CO2  19   --   BUN  11  10  CREATININE  0.80  0.90  GLUCOSE  210*  214*    Electrolytes Recent Labs     07/31/14  2120  CALCIUM  8.0*    Sepsis Markers No results found for this basename: LACTICACIDVEN, PROCALCITON, O2SATVEN,  in the last 72 hours  ABG Recent Labs     07/31/14  2258  PHART  7.372  PCO2ART  39.8  PO2ART  278.0*    Liver Enzymes Recent Labs     07/31/14  2120  AST  194*  ALT  257*  ALKPHOS  95  BILITOT  0.3  ALBUMIN  3.4*    Cardiac Enzymes No results found for this basename: TROPONINI, PROBNP,  in the last 72 hours  Glucose Recent Labs     07/31/14  2136  GLUCAP  175*    UDS: +opiates, benzos, tetrahydrocannabinol NEG: salicilates, ethanol, acetaminophen   Imaging Ct Head Wo Contrast  07/31/2014   CLINICAL DATA:  Drug overdose.  Unresponsive.  EXAM: CT HEAD WITHOUT CONTRAST  TECHNIQUE: Contiguous axial images were obtained from the base of the skull through the vertex without intravenous contrast.  COMPARISON:  09/02/2006.  FINDINGS: The ventricles are normal in size and configuration. No extra-axial fluid collections are identified. The gray-white differentiation is normal. No CT findings for acute intracranial process such as hemorrhage or infarction. No mass lesions. The brainstem and cerebellum are grossly normal.  The bony structures are intact. Scattered ethmoid sinus disease noted. There is a mucous retention cyst or polyp in the right maxillary sinus. The  mastoid air cells and middle ear cavities are clear. The globes are intact.  IMPRESSION: No acute intracranial findings or mass lesion.   Electronically Signed   By: Loralie Champagne M.D.   On: 07/31/2014 22:03   Dg Chest Portable 1 View  07/31/2014   CLINICAL DATA:  Drug overdose.  EXAM: PORTABLE CHEST - 1 VIEW  COMPARISON:  05/19/2010  FINDINGS: The  endotracheal tube is 3.5 cm above the carina. The NG tube is in the stomach. The cardiac silhouette, mediastinal and hilar contours are within normal limits given the supine position of the patient. The lungs are clear. No pleural effusion. The bony thorax is intact.  IMPRESSION: The endotracheal tube and NG tubes are in good position.  No acute pulmonary findings.   Electronically Signed   By: Loralie Champagne M.D.   On: 07/31/2014 21:37    EKG: normal sinus rhythm CXR: clear CXR  ASSESSMENT / PLAN: Active Problems:   Cardiac arrest   NEUROLOGIC A:  Altered mental status s/p cardiac arrest and likely overdose P: - sedation per therapeutic hypothermia; then re-eval  - CT head unremarkable  PULMONARY A: Respiratory failure due to inability to protect airway P: - vent support - wean FiO2, goal tidal volume 6cc  CARDIOVASCULAR A:  Witnessed cardiac arrest outside hospital - likely due to arrhythmia due to overdose, but would keep PE and underlying cardiac abnormality on differential P: - echocardiogram with bubble study - therapeutic hypothermia per protocol - Lower extremity dopplers first; consider CTA to eval for PE  RENAL No acute issues - monitor, replete lytes PRN  GASTROINTESTINAL A:  Transaminitis - ? Acute liver injury due to ingestion or ischemia P: - trend transaminases; RUQ U/S if increasing - acetaminophen level negative  HEMATOLOGIC No acute issues - monitor  INFECTIOUS DISEASE No acute issues - monitor  ENDOCRINE A:  Mild hyperglycemia  P: - monitor  BEST PRACTICE / DISPOSITION Level of Care:   ICU Consultants:  Cardiology Code Status:  Full Diet:  NPO; start TFs soon DVT Px:  Heparin subQ GI Px:  PPI Skin Integrity:  intact Social / Family:  Mother Thornell Sartorius [phone]  TODAY'S SUMMARY: 23 year old with out-of-hospital witnessed cardiac arrest, ROSC, now intubated and on therapeutic hypothermia protocol; likely due to complications of multiple-drug overdose.    I have personally obtained a history, examined the patient, evaluated laboratory and imaging results, formulated the assessment and plan and placed orders.  CRITICAL CARE: The patient is critically ill with multiple organ systems failure and requires high complexity decision making for assessment and support, frequent evaluation and titration of therapies, application of advanced monitoring technologies and extensive interpretation of multiple databases. Critical Care Time devoted to patient care services described in this note is 35 minutes.   Pershing Cox, MD Pulmonary and Critical Care Medicine [facility] Pager: [phone]  08/01/2014, 4:41 AM

## 2014-08-01 NOTE — Progress Notes (Signed)
Chaplain took prayer shawl to patient as requested by grandmother of pt.  Spiritual conversation with grandmother,strong faith, praying for healing.  Rev. [place], [place] [phone]

## 2014-08-01 NOTE — Procedures (Signed)
Arterial Catheter Insertion Procedure Note [name] [id number] 1991/06/15  Procedure: Insertion of Arterial Catheter  Indications: Blood pressure monitoring  Procedure Details Consent: Unable to obtain consent because of altered level of consciousness. Time Out: Verified patient identification, verified procedure, site/side was marked, verified correct patient position, special equipment/implants available, medications/allergies/relevent history reviewed, required imaging and test results available.  Performed  Maximum sterile technique was used including antiseptics, gloves, gown, hand hygiene and sheet. Skin prep: Chlorhexidine; local anesthetic administered 20 gauge catheter was inserted into left radial artery using the Seldinger technique.  Evaluation Blood flow good; BP tracing good. Complications: No apparent complications.   [name] 08/01/2014

## 2014-08-01 NOTE — Progress Notes (Signed)
  Echocardiogram 2D Echocardiogram has been performed.  [place] 08/01/2014, 5:24 PM

## 2014-08-01 NOTE — Progress Notes (Signed)
PULMONARY  / CRITICAL CARE MEDICINE   Name: [name] MRN: [id number] DOB: 19-Feb-1991    ADMISSION DATE:  07/31/2014  CHIEF COMPLAINT:  Cardiac arrest   BRIEF PATIENT DESCRIPTION: 23 year old with no significant past medical history presents via EMS being found pulseless after presumed OD. At least & min downtime with EMS but unclear how long down prior to.   SIGNIFICANT EVENTS / STUDIES:  08/01/14 intubated in ED for airway protection  LINES / TUBES: 08/01/14 L radial a line>>> 9/13 rt ij>>>  CULTURES: none  ANTIBIOTICS: none   PHYSICAL EXAM:  Temp:  [90.7 F (32.6 C)-99 F (37.2 C)] 91.2 F (32.9 C) (09/13 1200) Pulse Rate:  [52-130] 72 (09/13 1100) Resp:  [10-25] 16 (09/13 1100) BP: (98-148)/(59-107) 98/61 mmHg (09/13 1100) SpO2:  [95 %-100 %] 100 % (09/13 1100) Arterial Line BP: (112-156)/(69-88) 112/69 mmHg (09/13 1100) FiO2 (%):  [40 %-100 %] 40 % (09/13 1200) Weight:  [160 lb 11.5 oz (72.9 kg)-178 lb 9.2 oz (81.001 kg)] 160 lb 11.5 oz (72.9 kg) (09/13 0215)    VENTILATOR SETTINGS: Vent Mode:  [-] PRVC FiO2 (%):  [40 %-100 %] 40 % Set Rate:  [16 bmp] 16 bmp Vt Set:  [500 mL] 500 mL PEEP:  [5 cmH20] 5 cmH20 Plateau Pressure:  [19 cmH20-22 cmH20] 19 cmH20  General:  appears stated age, intubated, NAD  Neuro:  Sedated, paralyzed on vent, hypothermia protocol. HEENT:  Eyes anicteric. PERRL. OP unremarkable. Atraumatic, trachea midline.  Neck:  Supple, no lymphadenopathy Cardiovascular:  RRR, no murmurs /rubs/gallops Lungs: resps even non labored on full support, expiratory wheezing Abdomen:  Soft, nondistended. No tenderness illicited  Musculoskeletal:  No joint abnormalities. Muscle tone/bulk normal Skin:  No wounds or skin breakdown. No rashes.   Intake/Output     09/12 0701 - 09/13 0700 09/13 0701 - 09/14 0700   I.V. (mL/kg) 2299.4 (31.5) 201.5 (2.8)   IV Piggyback 400 200   Total Intake(mL/kg) 2699.4 (37) 401.5 (5.5)   Urine (mL/kg/hr) 1150 1175  (3)   Total Output 1150 1175   Net +1549.4 -773.5           Recent Labs Lab 07/31/14 2120 07/31/14 2128 08/01/14 0521 08/01/14 1107  HGB 13.0 13.9 14.3 14.6  HCT 38.9 41.0 42.0 43.0  WBC 12.1*  --   --   --   PLT 278  --   --   --     Recent Labs Lab 07/31/14 2120  08/01/14 0405 08/01/14 0521 08/01/14 0530 08/01/14 1000 08/01/14 1107  NA 139  < > 140 137 133* 137 140  K 3.5*  < > 3.8 3.3* 3.4* 4.0 3.5*  CL 104  < > 105 107 101 105 107  CO2 19  --  19  --  16* 19  --   GLUCOSE 210*  < > 112* 190* 176* 138* 162*  BUN 11  < > 8 <3* 5* 4* <3*  CREATININE 0.80  < > 0.56 0.30* 0.40* 0.35* 0.20*  CALCIUM 8.0*  --  7.8*  --  7.1* 7.6*  --   < > = values in this interval not displayed.  Recent Labs Lab 07/31/14 2120 08/01/14 0500  AST 194*  --   ALT 257*  --   ALKPHOS 95  --   BILITOT 0.3  --   PROT 6.7  --   ALBUMIN 3.4*  --   INR 1.09 1.10    Recent Labs Lab 07/31/14  2120 08/01/14 0500  INR 1.09 1.10     Cardiac Enzymes No results found for this basename: TROPONINI, PROBNP,  in the last 72 hours  Glucose Recent Labs     07/31/14  2136  08/01/14  0221  08/01/14  0312  08/01/14  0423  08/01/14  0510  GLUCAP  175*  113*  115*  140*  171*    UDS: +opiates, benzos, tetrahydrocannabinol NEG: salicilates, ethanol, acetaminophen   Imaging Ct Head Wo Contrast  07/31/2014   CLINICAL DATA:  Drug overdose.  Unresponsive.  EXAM: CT HEAD WITHOUT CONTRAST  TECHNIQUE: Contiguous axial images were obtained from the base of the skull through the vertex without intravenous contrast.  COMPARISON:  09/02/2006.  FINDINGS: The ventricles are normal in size and configuration. No extra-axial fluid collections are identified. The gray-white differentiation is normal. No CT findings for acute intracranial process such as hemorrhage or infarction. No mass lesions. The brainstem and cerebellum are grossly normal.  The bony structures are intact. Scattered ethmoid sinus  disease noted. There is a mucous retention cyst or polyp in the right maxillary sinus. The mastoid air cells and middle ear cavities are clear. The globes are intact.  IMPRESSION: No acute intracranial findings or mass lesion.   Electronically Signed   By: Loralie Champagne M.D.   On: 07/31/2014 22:03   Dg Chest Portable 1 View  07/31/2014   CLINICAL DATA:  Drug overdose.  EXAM: PORTABLE CHEST - 1 VIEW  COMPARISON:  05/19/2010  FINDINGS: The endotracheal tube is 3.5 cm above the carina. The NG tube is in the stomach. The cardiac silhouette, mediastinal and hilar contours are within normal limits given the supine position of the patient. The lungs are clear. No pleural effusion. The bony thorax is intact.  IMPRESSION: The endotracheal tube and NG tubes are in good position.  No acute pulmonary findings.   Electronically Signed   By: Loralie Champagne M.D.   On: 07/31/2014 21:37    EKG: normal sinus rhythm CXR: clear CXR  ASSESSMENT / PLAN:  PULMONARY A: Respiratory failure s/p arrest  P: Cont full vent support  F/u abg  F/u cxr  No sbt Avoid acidosis  CARDIOVASCULAR A:  Witnessed cardiac arrest outside hospital - likely due to arrhythmia v primary resp arrest in setting overdose P: Cont hypothermia protocol  Echo pending  BLE dopplers pending  Trend trop tillpeak Needs CVL will place now, assess cvp Map goal 80  RENAL Hypokalemia , cold diuresis P:  Close monitoring chem per hypothermia protocol  Monitor k, mag,phos  GASTROINTESTINAL A:  Transaminitis - ? Acute liver injury due to ingestion or ischemia P: trend transaminases RUQ U/S if increasing Send hep panel, hiv npo  HEMATOLOGIC DVT prev, at risk coagulapthy P:  SQ heparin   INFECTIOUS DISEASE Hypothermia, immunosuppresant P:  Monitor wbc off abx  Low threshold use ABX  ENDOCRINE A:  Mild hyperglycemia  P: add SSI  NEUROLOGIC A:  Altered mental status s/p cardiac arrest and likely overdose P: -  sedation per therapeutic hypothermia; then re-eval  - CT head unremarkable -EEG  I updated brother    Thornell Sartorius [phone]  TODAY'S SUMMARY: 23 year old with out-of-hospital witnessed cardiac arrest presumed r/t drug OD with arrhythmia or resp arrest.  Now on hypothermia protocol.  Continue protocol, monitor chem.  Place CVL.    I have personally obtained a history, examined the patient, evaluated laboratory and imaging results, formulated the assessment and plan and  placed orders.  CRITICAL CARE: The patient is critically ill with multiple organ systems failure and requires high complexity decision making for assessment and support, frequent evaluation and titration of therapies, application of advanced monitoring technologies and extensive interpretation of multiple databases. Critical Care Time devoted to patient care services described in this note is 30 minutes.   Dirk Dress, NP 08/01/2014  12:35 PM Pager: [phone] or [phone]    Ccm time  Mcarthur Rossetti. Tyson Alias, MD, FACP Pgr: [phone]  Pulmonary & Critical Care

## 2014-08-01 NOTE — Procedures (Signed)
Central Venous Catheter Insertion Procedure Note [name] [id number] Jul 23, 1991  Procedure: Insertion of Central Venous Catheter Indications: Assessment of intravascular volume, Drug and/or fluid administration and Frequent blood sampling  Procedure Details Consent: Unable to obtain consent because of emergent medical necessity. Time Out: Verified patient identification, verified procedure, site/side was marked, verified correct patient position, special equipment/implants available, medications/allergies/relevent history reviewed, required imaging and test results available.  Performed  Maximum sterile technique was used including antiseptics, cap, gloves, gown, hand hygiene, mask and sheet. Skin prep: Chlorhexidine; local anesthetic administered A antimicrobial bonded/coated triple lumen catheter was placed in the right internal jugular vein using the Seldinger technique.  Evaluation Blood flow good Complications: No apparent complications Patient did tolerate procedure well. Chest X-ray ordered to verify placement.  CXR: pending.  [name]. 08/01/2014, 1:31 PM  [place] bloodloss min

## 2014-08-01 NOTE — Progress Notes (Signed)
eLink Physician-Brief Progress Note Patient Name: [name] DOB: Dec 03, 1990 MRN: [id number]   Date of Service  08/01/2014  HPI/Events of Note  Low K replaced  eICU Interventions       Intervention Category Minor Interventions: Electrolytes abnormality - evaluation and management  Henry Russel, P 08/01/2014, 5:30 AM

## 2014-08-02 ENCOUNTER — Other Ambulatory Visit (HOSPITAL_COMMUNITY): Payer: Medicaid Other

## 2014-08-02 ENCOUNTER — Inpatient Hospital Stay (HOSPITAL_COMMUNITY): Payer: Medicaid Other

## 2014-08-02 DIAGNOSIS — J9600 Acute respiratory failure, unspecified whether with hypoxia or hypercapnia: Secondary | ICD-10-CM

## 2014-08-02 DIAGNOSIS — T50901A Poisoning by unspecified drugs, medicaments and biological substances, accidental (unintentional), initial encounter: Secondary | ICD-10-CM

## 2014-08-02 DIAGNOSIS — T50904A Poisoning by unspecified drugs, medicaments and biological substances, undetermined, initial encounter: Secondary | ICD-10-CM

## 2014-08-02 LAB — BASIC METABOLIC PANEL
Anion gap: 15 (ref 5–15)
Anion gap: 13 (ref 5–15)
Anion gap: 12 (ref 5–15)
Anion gap: 12 (ref 5–15)
Anion gap: 9 (ref 5–15)
Anion gap: 9 (ref 5–15)
BUN: <3 mg/dL — ABNORMAL LOW (ref 6–23)
BUN: <3 mg/dL — ABNORMAL LOW (ref 6–23)
CHLORIDE: 105 meq/L (ref 96–112)
CHLORIDE: 106 meq/L (ref 96–112)
CO2: 20 mEq/L (ref 19–32)
CO2: 20 mEq/L (ref 19–32)
CO2: 18 meq/L — AB (ref 19–32)
CO2: 20 meq/L (ref 19–32)
CO2: 21 mEq/L (ref 19–32)
CO2: 19 mEq/L (ref 19–32)
CREATININE: 0.33 mg/dL — AB (ref 0.50–1.10)
CREATININE: 0.34 mg/dL — AB (ref 0.50–1.10)
CREATININE: 0.37 mg/dL — AB (ref 0.50–1.10)
Calcium: 7.7 mg/dL — ABNORMAL LOW (ref 8.4–10.5)
Calcium: 7.9 mg/dL — ABNORMAL LOW (ref 8.4–10.5)
Calcium: 7.6 mg/dL — ABNORMAL LOW (ref 8.4–10.5)
Calcium: 7.7 mg/dL — ABNORMAL LOW (ref 8.4–10.5)
Calcium: 7.4 mg/dL — ABNORMAL LOW (ref 8.4–10.5)
Calcium: 7.3 mg/dL — ABNORMAL LOW (ref 8.4–10.5)
Chloride: 108 mEq/L (ref 96–112)
Chloride: 107 mEq/L (ref 96–112)
Chloride: 109 mEq/L (ref 96–112)
Chloride: 110 mEq/L (ref 96–112)
Creatinine, Ser: 0.39 mg/dL — ABNORMAL LOW (ref 0.50–1.10)
Creatinine, Ser: 0.48 mg/dL — ABNORMAL LOW (ref 0.50–1.10)
Creatinine, Ser: 0.5 mg/dL (ref 0.50–1.10)
GFR calc Af Amer: >90 mL/min (ref >90)
GFR calc Af Amer: >90 mL/min (ref >90)
GFR calc Af Amer: >90 mL/min (ref >90)
GFR calc Af Amer: >90 mL/min (ref >90)
GFR calc Af Amer: >90 mL/min (ref >90)
GFR calc Af Amer: >90 mL/min (ref >90)
GFR calc non Af Amer: >90 mL/min (ref >90)
GFR calc non Af Amer: >90 mL/min (ref >90)
GFR calc non Af Amer: >90 mL/min (ref >90)
GFR calc non Af Amer: >90 mL/min (ref >90)
GLUCOSE: 118 mg/dL — AB (ref 70–99)
GLUCOSE: 121 mg/dL — AB (ref 70–99)
GLUCOSE: 137 mg/dL — AB (ref 70–99)
GLUCOSE: 140 mg/dL — AB (ref 70–99)
GLUCOSE: 86 mg/dL (ref 70–99)
Glucose, Bld: 77 mg/dL (ref 70–99)
POTASSIUM: 3.5 meq/L — AB (ref 3.7–5.3)
POTASSIUM: 4.1 meq/L (ref 3.7–5.3)
POTASSIUM: 4.5 meq/L (ref 3.7–5.3)
Potassium: 3.8 mEq/L (ref 3.7–5.3)
Potassium: 3.4 mEq/L — ABNORMAL LOW (ref 3.7–5.3)
Potassium: 3.5 mEq/L — ABNORMAL LOW (ref 3.7–5.3)
Sodium: 138 mEq/L (ref 137–147)
Sodium: 139 mEq/L (ref 137–147)
Sodium: 140 mEq/L (ref 137–147)
Sodium: 139 mEq/L (ref 137–147)
Sodium: 139 mEq/L (ref 137–147)
Sodium: 138 mEq/L (ref 137–147)

## 2014-08-02 LAB — GLUCOSE, CAPILLARY
GLUCOSE-CAPILLARY: 127 mg/dL — AB (ref 70–99)
GLUCOSE-CAPILLARY: 127 mg/dL — AB (ref 70–99)
GLUCOSE-CAPILLARY: 84 mg/dL (ref 70–99)
Glucose-Capillary: 160 mg/dL — ABNORMAL HIGH (ref 70–99)
Glucose-Capillary: 137 mg/dL — ABNORMAL HIGH (ref 70–99)
Glucose-Capillary: 108 mg/dL — ABNORMAL HIGH (ref 70–99)
Glucose-Capillary: 76 mg/dL (ref 70–99)
Glucose-Capillary: 84 mg/dL (ref 70–99)

## 2014-08-02 LAB — CBC
HEMATOCRIT: 36.6 % (ref 36.0–46.0)
HEMOGLOBIN: 12.7 g/dL (ref 12.0–15.0)
MCH: 29.5 pg (ref 26.0–34.0)
MCHC: 34.7 g/dL (ref 30.0–36.0)
MCV: 84.9 fL (ref 78.0–100.0)
Platelets: 239 10*3/uL (ref 150–400)
RBC: 4.31 MIL/uL (ref 3.87–5.11)
RDW: 13.3 % (ref 11.5–15.5)
WBC: 10.8 10*3/uL — AB (ref 4.0–10.5)

## 2014-08-02 LAB — AST: AST: 78 U/L — ABNORMAL HIGH (ref 0–37)

## 2014-08-02 LAB — HEPATITIS PANEL, ACUTE
HCV Ab: REACTIVE — AB
HEP B S AG: NEGATIVE
Hep A IgM: NONREACTIVE
Hep B C IgM: NONREACTIVE

## 2014-08-02 LAB — ALT: ALT: 183 U/L — ABNORMAL HIGH (ref 0–35)

## 2014-08-02 LAB — TROPONIN I: Troponin I: <0.3 ng/mL (ref <0.30)

## 2014-08-02 MED ORDER — SODIUM CHLORIDE 0.9 % IV BOLUS (SEPSIS)
500.0000 mL | Freq: Once | INTRAVENOUS | Status: AC
  Administered 2014-08-02: 500 mL via INTRAVENOUS

## 2014-08-02 MED ORDER — SODIUM CHLORIDE 0.9 % IV BOLUS (SEPSIS)
1000.0000 mL | Freq: Once | INTRAVENOUS | Status: AC
  Administered 2014-08-02: 1000 mL via INTRAVENOUS

## 2014-08-02 MED ORDER — SODIUM CHLORIDE 0.9 % IV BOLUS (SEPSIS)
2000.0000 mL | Freq: Once | INTRAVENOUS | Status: AC
  Administered 2014-08-02: 2000 mL via INTRAVENOUS

## 2014-08-02 MED ORDER — PHENYLEPHRINE HCL 10 MG/ML IJ SOLN
30.0000 ug/min | INTRAVENOUS | Status: DC
  Administered 2014-08-02: 20 ug/min via INTRAVENOUS
  Filled 2014-08-02 (×2): qty 4

## 2014-08-02 NOTE — Progress Notes (Signed)
Spoke to nurse and patient still cold and will not be rewarmed to 7pm, per nurse EEG to be completed tomorrow. EEG will be performed first thing in am 08/03/14

## 2014-08-02 NOTE — Progress Notes (Signed)
PULMONARY  / CRITICAL CARE MEDICINE   Name: [name] MRN: [id number] DOB: 03-21-1991    ADMISSION DATE:  07/31/2014  CHIEF COMPLAINT:  Cardiac arrest   BRIEF PATIENT DESCRIPTION: 23 year old with no significant past medical history presents via EMS being found pulseless after presumed OD. At least & min downtime with EMS but unclear how long down prior to.   SIGNIFICANT EVENTS / STUDIES:  08/01/14 intubated in ED for airway protection 9/13 Echo> normal LV size and function, LVEF 65-70%, RVSP 45mm Hg 9/13 LE doppler > negative for DVT  LINES / TUBES: 08/01/14 L radial a line>>> 9/13 rt ij>>>  CULTURES: none  ANTIBIOTICS: none  SUBJECTIVE: Remains on hypothermia protocol, more tachycardic today   PHYSICAL EXAM:  Temp:  [90.7 F (32.6 C)-95.2 F (35.1 C)] 95.2 F (35.1 C) (09/14 1200) Pulse Rate:  [60-138] 138 (09/14 1200) Resp:  [16-18] 18 (09/14 1200) BP: (85-128)/(55-93) 109/71 mmHg (09/14 1200) SpO2:  [100 %] 100 % (09/14 1200) Arterial Line BP: (94-156)/(54-90) 156/82 mmHg (09/14 1200) FiO2 (%):  [40 %] 40 % (09/14 1200) Weight:  [74.3 kg (163 lb 12.8 oz)] 74.3 kg (163 lb 12.8 oz) (09/14 0400) CVP:  [1 mmHg-2 mmHg] 2 mmHg  VENTILATOR SETTINGS: Vent Mode:  [-] PRVC FiO2 (%):  [40 %] 40 % Set Rate:  [16 bmp] 16 bmp Vt Set:  [500 mL] 500 mL PEEP:  [5 cmH20] 5 cmH20 Plateau Pressure:  [16 cmH20-20 cmH20] 16 cmH20  Gen: sedated, paralyzed on vent HEENT: NCAT, ETT in place PULM: CTA B CV: Tachy, regular, no mgr, no JVD AB: BS infrequent, soft, limited by pads Ext: cool, no edema, no clubbing, no cyanosis Derm: no rash or skin breakdown Neuro: sedate/paralyzed on vent    Intake/Output     09/13 0701 - 09/14 0700 09/14 0701 - 09/15 0700   I.V. (mL/kg) 1539.5 (20.7) 375.9 (5.1)   IV Piggyback 400    Total Intake(mL/kg) 1939.5 (26.1) 375.9 (5.1)   Urine (mL/kg/hr) 2515 (1.4) 120 (0.3)   Total Output 2515 120   Net -575.5 +255.9           Recent  Labs Lab 07/31/14 2120  08/01/14 0521 08/01/14 1107 08/02/14 0400  HGB 13.0  < > 14.3 14.6 12.7  HCT 38.9  < > 42.0 43.0 36.6  WBC 12.1*  --   --   --  10.8*  PLT 278  --   --   --  239  < > = values in this interval not displayed.  Recent Labs Lab 08/01/14 1601 08/01/14 2122 08/02/14 08/02/14 0400 08/02/14 0800  NA 137 138 138 139 140  K 3.4* 3.7 3.8 3.4* 3.5*  CL 105 104 105 106 108  CO2 20 17* 18* 20 20  GLUCOSE 162* 159* 140* 137* 118*  BUN 3* <3* <3* <3* <3*  CREATININE 0.33* 0.35* 0.33* 0.34* 0.37*  CALCIUM 7.7* 7.7* 7.7* 7.9* 7.6*    Recent Labs Lab 07/31/14 2120 08/01/14 0500 08/02/14 0400  AST 194*  --  78*  ALT 257*  --  183*  ALKPHOS 95  --   --   BILITOT 0.3  --   --   PROT 6.7  --   --   ALBUMIN 3.4*  --   --   INR 1.09 1.10  --     Recent Labs Lab 07/31/14 2120 08/01/14 0500  INR 1.09 1.10     Cardiac Enzymes Recent Labs  08/01/14  1322  08/01/14  1824  08/02/14  0413  TROPONINI  <0.30  <0.30  <0.30    Glucose Recent Labs     08/01/14  1007  08/01/14  1226  08/01/14  1558  08/01/14  2103  08/02/14  0015  08/02/14  0359  GLUCAP  140*  167*  156*  160*  127*  137*    UDS: +opiates, benzos, tetrahydrocannabinol NEG: salicilates, ethanol, acetaminophen   Imaging Ct Head Wo Contrast  07/31/2014   CLINICAL DATA:  Drug overdose.  Unresponsive.  EXAM: CT HEAD WITHOUT CONTRAST  TECHNIQUE: Contiguous axial images were obtained from the base of the skull through the vertex without intravenous contrast.  COMPARISON:  09/02/2006.  FINDINGS: The ventricles are normal in size and configuration. No extra-axial fluid collections are identified. The gray-white differentiation is normal. No CT findings for acute intracranial process such as hemorrhage or infarction. No mass lesions. The brainstem and cerebellum are grossly normal.  The bony structures are intact. Scattered ethmoid sinus disease noted. There is a mucous retention cyst or polyp  in the right maxillary sinus. The mastoid air cells and middle ear cavities are clear. The globes are intact.  IMPRESSION: No acute intracranial findings or mass lesion.   Electronically Signed   By: Loralie Champagne M.D.   On: 07/31/2014 22:03   Dg Chest Port 1 View  08/02/2014   CLINICAL DATA:  Follow-up of respiratory failure  EXAM: PORTABLE CHEST - 1 VIEW  COMPARISON:  Portable chest x-ray of August 01, 2014  FINDINGS: The lungs are adequately inflated. There is no focal infiltrate. Linear density projecting over the left pulmonary apex may reflect new subsegmental atelectasis or may reflect a structure external to the patient. There are external cardiac pacing-defibrillator leads present. The cardiac silhouette is normal in size. The pulmonary vascularity is normal. The endotracheal tube tip lies approximately 1.9 cm above the crotch of the carina. The esophagogastric tube tip and proximal port project below the level of the GE junction. The right internal jugular venous catheter tip lies in the midportion of the SVC.  IMPRESSION: 1. There has not been significant interval change in the appearance of the lungs or mediastinal structures since yesterday's study. 2. The endotracheal tube tip is 2 cm above the crotch of the carina. Withdrawal by 1-2 cm would help assure that accidental mainstem bronchus intubation does not occur with patient movement.   Electronically Signed   By: David  [place]   On: 08/02/2014 07:21   Dg Chest Port 1 View  08/01/2014   CLINICAL DATA:  Cardiac arrest.  Central line placement.  EXAM: PORTABLE CHEST - 1 VIEW  COMPARISON:  07/31/2014 chest radiograph  FINDINGS: A right IJ central venous catheter has been place with tip overlying the superior cavoatrial junction.  An endotracheal tube is again identified with tip 3 cm above the carina.  An NG tube is identified entering the stomach with tip off the field of view.  The due for per Liver pad overlying the chest is noted.  There  is no evidence of focal airspace disease, pulmonary edema, suspicious pulmonary nodule/mass, pleural effusion, or pneumothorax. No acute bony abnormalities are identified.  IMPRESSION: Right IJ central venous catheter placement with tip overlying the superior cavoatrial junction. No evidence of pneumothorax.  No evidence of acute cardiopulmonary disease.   Electronically Signed   By: Laveda Abbe M.D.   On: 08/01/2014 14:17   Dg Chest Portable 1 View  07/31/2014  CLINICAL DATA:  Drug overdose.  EXAM: PORTABLE CHEST - 1 VIEW  COMPARISON:  05/19/2010  FINDINGS: The endotracheal tube is 3.5 cm above the carina. The NG tube is in the stomach. The cardiac silhouette, mediastinal and hilar contours are within normal limits given the supine position of the patient. The lungs are clear. No pleural effusion. The bony thorax is intact.  IMPRESSION: The endotracheal tube and NG tubes are in good position.  No acute pulmonary findings.   Electronically Signed   By: Loralie Champagne M.D.   On: 07/31/2014 21:37    EKG: normal sinus rhythm CXR: clear CXR 9/14  ASSESSMENT / PLAN:  PULMONARY A: Respiratory failure s/p cardiac arrest  P: Cont full vent support today Hopefully SBT/WUA on 9/15  CARDIOVASCULAR A:  Witnessed cardiac arrest outside hospital - likely due to arrhythmia v primary resp arrest in setting overdose P: rewarm today, maintain normothermia 72 hours Map goal 80 Tele  RENAL Hypokalemia , cold diuresis P:  Close monitoring chem per hypothermia protocol  Monitor k, mag,phos  GASTROINTESTINAL A:  Transaminitis - shock liver, resolving Hepatitis C Ab positive, Hep A and B negative P: Send Hep C genotype and viral load if wakes up  HEMATOLOGIC DVT prev, at risk coagulapthy P:  SQ heparin   INFECTIOUS DISEASE HIV negative P:  Monitor wbc off abx  Low threshold use ABX  ENDOCRINE A:  Mild hyperglycemia  P: add SSI  NEUROLOGIC A:  Altered mental status s/p cardiac arrest  and likely overdose Suicide attempt P: Assess mental status after rewarmed and sedation held this evening 1800 D/c EEG Will need suicide precautions/psyche eval depending on neuro condition after rewarming  I updated father at bedside   Thornell Sartorius [phone]  TODAY'S SUMMARY: 23 y/o female with out of hospital cardia   I have personally obtained a history, examined the patient, evaluated laboratory and imaging results, formulated the assessment and plan and placed orders.  CRITICAL CARE: The patient is critically ill with multiple organ systems failure and requires high complexity decision making for assessment and support, frequent evaluation and titration of therapies, application of advanced monitoring technologies and extensive interpretation of multiple databases. Critical Care Time devoted to patient care services described in this note is 40 minutes.   Heber Muscoda, MD [facility] Pager: [phone] Cell: [phone] If no response, call [phone]

## 2014-08-02 NOTE — Progress Notes (Addendum)
eLink Physician-Brief Progress Note Patient Name: [name] DOB: 12/05/1990 MRN: [id number]   Date of Service  08/02/2014  HPI/Events of Note  HR 144 sinus. EF 65%.  CVP 6. MAP 68 Following commands on high dose sedation gtt  eICU Interventions  2L fluid bolus and reassess HR Turn off sedation gradually overnight       Intervention Category Major Interventions: Arrhythmia - evaluation and management  Zailee Vallely 08/02/2014, 5:03 PM

## 2014-08-03 ENCOUNTER — Inpatient Hospital Stay (HOSPITAL_COMMUNITY): Payer: Medicaid Other

## 2014-08-03 ENCOUNTER — Other Ambulatory Visit (HOSPITAL_COMMUNITY): Payer: Medicaid Other

## 2014-08-03 DIAGNOSIS — F1910 Other psychoactive substance abuse, uncomplicated: Secondary | ICD-10-CM

## 2014-08-03 LAB — BLOOD GAS, ARTERIAL
Acid-base deficit: 4.5 mmol/L — ABNORMAL HIGH (ref 0.0–2.0)
BICARBONATE: 21.1 meq/L (ref 20.0–24.0)
Drawn by: 41934
FIO2: 0.4 %
MECHVT: 500 mL
O2 Saturation: 98.2 %
PCO2 ART: 45.9 mmHg — AB (ref 35.0–45.0)
PEEP/CPAP: 5 cmH2O
PH ART: 7.284 — AB (ref 7.350–7.450)
Patient temperature: 98.6
RATE: 16 resp/min
TCO2: 22.5 mmol/L (ref 0–100)
pO2, Arterial: 110 mmHg — ABNORMAL HIGH (ref 80.0–100.0)

## 2014-08-03 LAB — GLUCOSE, CAPILLARY
GLUCOSE-CAPILLARY: 61 mg/dL — AB (ref 70–99)
GLUCOSE-CAPILLARY: 74 mg/dL (ref 70–99)
GLUCOSE-CAPILLARY: 69 mg/dL — AB (ref 70–99)
GLUCOSE-CAPILLARY: 54 mg/dL — AB (ref 70–99)
GLUCOSE-CAPILLARY: 64 mg/dL — AB (ref 70–99)
GLUCOSE-CAPILLARY: 58 mg/dL — AB (ref 70–99)
Glucose-Capillary: 82 mg/dL (ref 70–99)
Glucose-Capillary: 74 mg/dL (ref 70–99)
Glucose-Capillary: 51 mg/dL — ABNORMAL LOW (ref 70–99)
Glucose-Capillary: 56 mg/dL — ABNORMAL LOW (ref 70–99)
Glucose-Capillary: 67 mg/dL — ABNORMAL LOW (ref 70–99)

## 2014-08-03 LAB — URINE MICROSCOPIC-ADD ON

## 2014-08-03 LAB — BASIC METABOLIC PANEL
Anion gap: 10 (ref 5–15)
CO2: 21 mEq/L (ref 19–32)
Calcium: 7.7 mg/dL — ABNORMAL LOW (ref 8.4–10.5)
Chloride: 109 mEq/L (ref 96–112)
Creatinine, Ser: 0.5 mg/dL (ref 0.50–1.10)
GFR calc Af Amer: >90 mL/min (ref >90)
GFR calc non Af Amer: >90 mL/min (ref >90)
Glucose, Bld: 82 mg/dL (ref 70–99)
POTASSIUM: 4.4 meq/L (ref 3.7–5.3)
Sodium: 140 mEq/L (ref 137–147)

## 2014-08-03 LAB — URINALYSIS, ROUTINE W REFLEX MICROSCOPIC
BILIRUBIN URINE: NEGATIVE
Glucose, UA: NEGATIVE mg/dL
Ketones, ur: 40 mg/dL — AB
Leukocytes, UA: NEGATIVE
NITRITE: NEGATIVE
PH: 5 (ref 5.0–8.0)
Protein, ur: NEGATIVE mg/dL
SPECIFIC GRAVITY, URINE: 1.011 (ref 1.005–1.030)
Urobilinogen, UA: 0.2 mg/dL (ref 0.0–1.0)

## 2014-08-03 LAB — CBC WITH DIFFERENTIAL/PLATELET
Basophils Absolute: 0 10*3/uL (ref 0.0–0.1)
Basophils Relative: 0 % (ref 0–1)
Eosinophils Absolute: 0.2 10*3/uL (ref 0.0–0.7)
Eosinophils Relative: 2 % (ref 0–5)
HCT: 32.5 % — ABNORMAL LOW (ref 36.0–46.0)
HEMOGLOBIN: 10.9 g/dL — AB (ref 12.0–15.0)
LYMPHS ABS: 1.2 10*3/uL (ref 0.7–4.0)
Lymphocytes Relative: 11 % — ABNORMAL LOW (ref 12–46)
MCH: 29.4 pg (ref 26.0–34.0)
MCHC: 33.5 g/dL (ref 30.0–36.0)
MCV: 87.6 fL (ref 78.0–100.0)
MONOS PCT: 6 % (ref 3–12)
Monocytes Absolute: 0.7 10*3/uL (ref 0.1–1.0)
NEUTROS ABS: 8.9 10*3/uL — AB (ref 1.7–7.7)
Neutrophils Relative %: 81 % — ABNORMAL HIGH (ref 43–77)
Platelets: 199 10*3/uL (ref 150–400)
RBC: 3.71 MIL/uL — AB (ref 3.87–5.11)
RDW: 14.2 % (ref 11.5–15.5)
WBC: 11 10*3/uL — AB (ref 4.0–10.5)

## 2014-08-03 MED ORDER — SODIUM CHLORIDE 0.9 % IV SOLN
3.0000 g | Freq: Four times a day (QID) | INTRAVENOUS | Status: DC
  Administered 2014-08-03 – 2014-08-06 (×12): 3 g via INTRAVENOUS
  Filled 2014-08-03 (×14): qty 3

## 2014-08-03 MED ORDER — DEXTROSE 50 % IV SOLN
INTRAVENOUS | Status: AC
  Filled 2014-08-03: qty 50

## 2014-08-03 MED ORDER — DEXTROSE 50 % IV SOLN
25.0000 g | Freq: Once | INTRAVENOUS | Status: AC
Start: 2014-08-03 — End: 2014-08-03
  Administered 2014-08-03: 25 g via INTRAVENOUS

## 2014-08-03 MED ORDER — DEXTROSE 50 % IV SOLN
25.0000 mL | Freq: Once | INTRAVENOUS | Status: AC
  Administered 2014-08-03: 25 mL via INTRAVENOUS
  Filled 2014-08-03: qty 50

## 2014-08-03 MED ORDER — JEVITY 1.2 CAL PO LIQD
1000.0000 mL | ORAL | Status: DC
  Administered 2014-08-03: 20 mL/h
  Filled 2014-08-03 (×3): qty 1000

## 2014-08-03 MED ORDER — DEXTROSE-NACL 5-0.45 % IV SOLN
INTRAVENOUS | Status: DC
  Administered 2014-08-04: 01:00:00 via INTRAVENOUS

## 2014-08-03 MED ORDER — DEXTROSE 50 % IV SOLN
25.0000 mL | Freq: Once | INTRAVENOUS | Status: AC | PRN
  Administered 2014-08-03: 25 mL via INTRAVENOUS

## 2014-08-03 MED ORDER — IPRATROPIUM-ALBUTEROL 0.5-2.5 (3) MG/3ML IN SOLN
3.0000 mL | Freq: Four times a day (QID) | RESPIRATORY_TRACT | Status: DC
  Administered 2014-08-03 – 2014-08-05 (×6): 3 mL via RESPIRATORY_TRACT
  Filled 2014-08-03 (×6): qty 3

## 2014-08-03 MED ORDER — DEXTROSE 50 % IV SOLN
INTRAVENOUS | Status: AC
  Administered 2014-08-03: 25 mL
  Filled 2014-08-03: qty 50

## 2014-08-03 MED ORDER — ACETAMINOPHEN 160 MG/5ML PO SOLN: 325.0000 mg | Freq: Four times a day (QID) | ORAL | Status: DC | PRN

## 2014-08-03 MED ORDER — VITAL HIGH PROTEIN PO LIQD: 1000.0000 mL | ORAL | Status: DC

## 2014-08-03 MED ORDER — JEVITY 1.2 CAL PO LIQD
1000.0000 mL | ORAL | Status: DC
  Filled 2014-08-03 (×2): qty 1000

## 2014-08-03 MED ORDER — PRO-STAT SUGAR FREE PO LIQD
30.0000 mL | Freq: Three times a day (TID) | ORAL | Status: DC
  Administered 2014-08-03 (×2): 30 mL
  Filled 2014-08-03 (×5): qty 30

## 2014-08-03 MED ORDER — SODIUM CHLORIDE 0.9 % IV BOLUS (SEPSIS)
1000.0000 mL | Freq: Once | INTRAVENOUS | Status: AC
  Administered 2014-08-03: 1000 mL via INTRAVENOUS

## 2014-08-03 MED ORDER — DEXTROSE 50 % IV SOLN
INTRAVENOUS | Status: AC
  Administered 2014-08-03: 50 mL
  Filled 2014-08-03: qty 50

## 2014-08-03 NOTE — Progress Notes (Signed)
INITIAL NUTRITION ASSESSMENT  DOCUMENTATION CODES Per approved criteria  -Not Applicable   INTERVENTION: Initiate Jevity 1.2 via OG tube at 20 ml/hr and increase by 10 ml every 4 hours to goal rate of 50 ml/hr.   30 ml Prostat TID.    Tube feeding regimen provides 1740 kcal (96% of needs), 111 grams of protein, and 972 ml of H2O.   NUTRITION DIAGNOSIS: Inadequate oral intake related to inability to eat as evidenced by NPO status  Goal: Pt to meet >/= 90% of their estimated nutrition needs   Monitor:  TF initiation/tolerance, weight trends, labs  Reason for Assessment: Consult received to initiate and manage enteral nutrition support.  23 y.o. female  Admitting Dx: Overdose  ASSESSMENT: Pt with respiratory failure s/p cardiac arrest in setting of OD.   Patient is currently intubated on ventilator support MV: 10.7 L/min Temp (24hrs), Avg:99.1 F (37.3 C), Min:97.7 F (36.5 C), Max:100.2 F (37.9 C)  No signs of fat or muscle depletion. Father at bedside and reports no recent weight or appetite changes. He does report pt has been depressed but he does not think it has effected her appetite.   Height: Ht Readings from Last 1 Encounters:  08/01/14  (1.676 m)    Weight: Wt Readings from Last 1 Encounters:  08/03/14 164 lb 15.5 oz (74.83 kg)    Ideal Body Weight: 59 kg   % Ideal Body Weight: 127%  Wt Readings from Last 10 Encounters:  08/03/14 164 lb 15.5 oz (74.83 kg)  12/31/13 180 lb (81.647 kg)  12/31/13 179 lb 12.8 oz (81.557 kg)  12/23/13 176 lb (79.833 kg)  12/15/13 174 lb 6.4 oz (79.107 kg)  10/26/13 160 lb (72.576 kg)  09/04/13 160 lb (72.576 kg)  07/22/13 157 lb (71.215 kg)  04/10/12 162 lb (73.483 kg)  02/28/12 170 lb (77.111 kg)    Usual Body Weight: variable   % Usual Body Weight: -  BMI:  Body mass index is 26.64 kg/(m^2).  Estimated Nutritional Needs: Kcal: 1808 Protein: > 90 grams Fluid: > 1.8 L/day  Skin: WDL  Diet Order:  NPO  EDUCATION NEEDS: -No education needs identified at this time   Intake/Output Summary (Last 24 hours) at 08/03/14 1515 Last data filed at 08/03/14 1300  Gross per 24 hour  Intake 1570.31 ml  Output   1455 ml  Net 115.31 ml    Last BM: PTA   Labs:   Recent Labs Lab 08/02/14 1600 08/02/14 2000 08/03/14 0600  NA 139 138 140  K 4.1 4.5 4.4  CL 109 110 109  CO2 BUN <3* <3* <3*  CREATININE 0.48* 0.50 0.50  CALCIUM 7.4* 7.3* 7.7*  GLUCOSE 86 77 82    CBG (last 3)   Recent Labs  08/02/14 2325 08/03/14 0409 08/03/14 1239  GLUCAP 84 82 51*    Scheduled Meds: . ampicillin-sulbactam (UNASYN) IV  3 g Intravenous Q6H  . antiseptic oral rinse  7 mL Mouth Rinse QID  . artificial tears   Both Eyes 3 times per day  . chlorhexidine  15 mL Mouth Rinse BID  . Chlorhexidine Gluconate Cloth  6 each Topical Q0600  . feeding supplement (JEVITY 1.2 CAL)  1,000 mL Per Tube Q24H  . heparin  5,000 Units Subcutaneous 3 times per day  . insulin aspart  0-15 Units Subcutaneous 6 times per day  . ipratropium-albuterol  3 mL Nebulization QID  . mupirocin ointment  1 application Nasal  BID  . pantoprazole (PROTONIX) IV  40 mg Intravenous QHS    Continuous Infusions: . cisatracurium (NIMBEX) infusion Stopped (08/02/14 1500)  . fentaNYL infusion INTRAVENOUS 200 mcg/hr (08/03/14 1300)  . midazolam (VERSED) infusion 6 mg/hr (08/03/14 1230)  . norepinephrine (LEVOPHED) Adult infusion Stopped (08/02/14 1636)  . phenylephrine (NEO-SYNEPHRINE) Adult infusion Stopped (08/03/14 1100)    Past Medical History  Diagnosis Date  . No pertinent past medical history   . Family history of varicose veins   . FHx: hypertension   . FHx: thyroid disease   . FHx: scoliosis   . H/O varicella   . Hx: UTI (urinary tract infection) 2012  . Postpartum eclampsia 08/2006  . Bacterial infection   . Heart murmur     Past Surgical History  Procedure Laterality Date  . No past surgeries       Kendell Bane RD, LDN, CNSC [phone] Pager [phone] After Hours Pager

## 2014-08-03 NOTE — Progress Notes (Signed)
   RN called with CBG 58 despite starting tube feeds few hours ago  A Hypoglycemia  p Start d5 half normal saline at kvo   Dr. Kalman Shan, M.D., [facility].C.P Pulmonary and Critical Care Medicine Staff Physician [facility] [place] [facility] Pager: [phone], If no answer or between  15:00h - 7:00h: call [phone]  08/03/2014 8:58 PM

## 2014-08-03 NOTE — Progress Notes (Signed)
RT note-ETT pulled back from carina per MD order and secured at 21cm. Patient weaned for 30 min and stopped due to HR 150.

## 2014-08-03 NOTE — Progress Notes (Signed)
PULMONARY  / CRITICAL CARE MEDICINE   Name: [name] MRN: [id number] DOB: [birth year]    ADMISSION DATE:  07/31/2014  CHIEF COMPLAINT:  Cardiac arrest   BRIEF PATIENT DESCRIPTION: 23 year old with no significant past medical history presents via EMS being found pulseless after presumed OD.  At least & min downtime with EMS but unclear how long down prior to.   SIGNIFICANT EVENTS / STUDIES:  08/01/14 intubated in ED for airway protection 9/13 Echo> normal LV size and function, LVEF 65-70%, RVSP 45mm Hg 9/13 LE doppler > negative for DVT  LINES / TUBES: 08/01/14 L radial a line>>> 9/13 rt ij>>> 9/13 ETT  CULTURES: Blood 9/15>>> Urine 9/15>>> Sputum 9/15>>>  ANTIBIOTICS: Unasyn 9/15>>>  SUBJECTIVE: Rewarmed and following some commands.  PHYSICAL EXAM:  Temp:  [94.3 F (34.6 C)-99.7 F (37.6 C)] 99.7 F (37.6 C) (09/15 0800) Pulse Rate:  [106-159] 139 (09/15 1000) Resp:  [6-30] 16 (09/15 0353) BP: (65-109)/(42-81) 90/52 mmHg (09/15 1000) SpO2:  [99 %-100 %] 100 % (09/15 1000) Arterial Line BP: (80-156)/(40-82) 118/49 mmHg (09/15 1000) FiO2 (%):  [40 %] 40 % (09/15 0800) Weight:  [74.83 kg (164 lb 15.5 oz)] 74.83 kg (164 lb 15.5 oz) (09/15 0410) CVP:  [4 mmHg-6 mmHg] 4 mmHg  VENTILATOR SETTINGS: Vent Mode:  [-] PSV;CPAP FiO2 (%):  [40 %] 40 % Set Rate:  [16 bmp] 16 bmp Vt Set:  [500 mL] 500 mL PEEP:  [5 cmH20] 5 cmH20 Pressure Support:  [5 cmH20] 5 cmH20 Plateau Pressure:  [8 cmH20-17 cmH20] 16 cmH20  Gen: Sedated but able to follow commands and easily arousable HEENT: NCAT, ETT in place PULM: CTA B CV: Tachy, regular, no mgr, no JVD AB: BS infrequent, soft, limited by pads Ext: cool, no edema, no clubbing, no cyanosis Derm: no rash or skin breakdown Neuro: Awake, interactive, tracks and follows all commands.   Intake/Output     09/14 0701 - 09/15 0700 09/15 0701 - 09/16 0700   I.V. (mL/kg) 1928.7 (25.8) 85.1 (1.1)   NG/GT 50    IV Piggyback 500     Total Intake(mL/kg) 2478.7 (33.1) 85.1 (1.1)   Urine (mL/kg/hr) 795 (0.4) 325 (1.3)   Total Output 795 325   Net +1683.7 -239.9          Recent Labs Lab 07/31/14 2120  08/01/14 1107 08/02/14 0400 08/03/14 0600  HGB 13.0  < > 14.6 12.7 10.9*  HCT 38.9  < > 43.0 36.6 32.5*  WBC 12.1*  --   --  10.8* 11.0*  PLT 278  --   --  239 199  < > = values in this interval not displayed.  Recent Labs Lab 08/02/14 0800 08/02/14 1200 08/02/14 1600 08/02/14 2000 08/03/14 0600  NA 140 139 139 138 140  K 3.5* 3.5* 4.1 4.5 4.4  CL 108 107 109 110 109  CO2 GLUCOSE 118* 121* 86 77 82  BUN <3* <3* <3* <3* <3*  CREATININE 0.37* 0.39* 0.48* 0.50 0.50  CALCIUM 7.6* 7.7* 7.4* 7.3* 7.7*   Recent Labs Lab 07/31/14 2120 08/01/14 0500 08/02/14 0400  AST 194*  --  78*  ALT 257*  --  183*  ALKPHOS 95  --   --   BILITOT 0.3  --   --   PROT 6.7  --   --   ALBUMIN 3.4*  --   --   INR 1.09 1.10  --  Recent Labs Lab 07/31/14 2120 08/01/14 0500  INR 1.09 1.10   Cardiac Enzymes Recent Labs     08/01/14  1322  08/01/14  1824  08/02/14  0413  TROPONINI  <0.30  <0.30  <0.30   Glucose Recent Labs     08/02/14  0746  08/02/14  1155  08/02/14  1554  08/02/14  2002  08/02/14  2325  08/03/14  0409  GLUCAP  108*  127*  84  76  84  82   UDS: +opiates, benzos, tetrahydrocannabinol NEG: salicilates, ethanol, acetaminophen   Imaging Dg Chest Port 1 View  08/03/2014   CLINICAL DATA:  23 year old female. Follow up endotracheal tube placement.  EXAM: PORTABLE CHEST - 1 VIEW  COMPARISON:  Prior chest x-ray 08/02/2014, 08/01/2014  FINDINGS: The cardiomediastinal silhouette unchanged in size and contour.  Lung volumes remain low with hilar opacities likely reflecting atelectasis. No pleural effusion or pneumothorax. No confluent airspace disease.  Defibrillator pads project over the chest.  Endotracheal tube remains in position, approximately 2.2 cm above the carina.  Unchanged  position of right-sided IJ central line which terminates in the right atrium.  Overlying EKG leads.  No displaced acute fracture identified.  IMPRESSION: Similar appearance the chest x-ray to the most recent prior with low lung volumes and likely hilar atelectasis.  Endotracheal tube is not significantly changed in position, measuring approximately 2.2 cm above the carina.  Unchanged right IJ central line.  Signed,  Yvone Neu. Loreta Ave, DO  [facility]  [facility]   Electronically Signed   By: Gilmer Mor O.D.   On: 08/03/2014 07:36   Dg Chest Port 1 View  08/02/2014   CLINICAL DATA:  Follow-up of respiratory failure  EXAM: PORTABLE CHEST - 1 VIEW  COMPARISON:  Portable chest x-ray of August 01, 2014  FINDINGS: The lungs are adequately inflated. There is no focal infiltrate. Linear density projecting over the left pulmonary apex may reflect new subsegmental atelectasis or may reflect a structure external to the patient. There are external cardiac pacing-defibrillator leads present. The cardiac silhouette is normal in size. The pulmonary vascularity is normal. The endotracheal tube tip lies approximately 1.9 cm above the crotch of the carina. The esophagogastric tube tip and proximal port project below the level of the GE junction. The right internal jugular venous catheter tip lies in the midportion of the SVC.  IMPRESSION: 1. There has not been significant interval change in the appearance of the lungs or mediastinal structures since yesterday's study. 2. The endotracheal tube tip is 2 cm above the crotch of the carina. Withdrawal by 1-2 cm would help assure that accidental mainstem bronchus intubation does not occur with patient movement.   Electronically Signed   By: David  [place]   On: 08/02/2014 07:21   Dg Chest Port 1 View  08/01/2014   CLINICAL DATA:  Cardiac arrest.  Central line placement.  EXAM: PORTABLE CHEST - 1 VIEW  COMPARISON:  07/31/2014 chest  radiograph  FINDINGS: A right IJ central venous catheter has been place with tip overlying the superior cavoatrial junction.  An endotracheal tube is again identified with tip 3 cm above the carina.  An NG tube is identified entering the stomach with tip off the field of view.  The due for per Liver pad overlying the chest is noted.  There is no evidence of focal airspace disease, pulmonary edema, suspicious pulmonary nodule/mass, pleural effusion, or pneumothorax. No acute bony abnormalities are identified.  IMPRESSION: Right IJ central venous catheter placement with tip overlying the superior cavoatrial junction. No evidence of pneumothorax.  No evidence of acute cardiopulmonary disease.   Electronically Signed   By: Laveda Abbe M.D.   On: 08/01/2014 14:17   EKG: normal sinus rhythm CXR: clear CXR 9/14  ASSESSMENT / PLAN:  PULMONARY A: Respiratory failure s/p cardiac arrest  P: Cont full vent support today Anticipate will be able to extubate once more hemodynamically sound Titrate O2 down.  CARDIOVASCULAR A:  Witnessed cardiac arrest outside hospital - likely due to arrhythmia v primary resp arrest in setting overdose Sinus tach and febrile. P: Rewarmed but in sinus tachycardia, CVP of 5 1 liter NS bolus x1 Control fever Map goal 80 Tele Titrate Neo down as able.  RENAL Hypokalemia , cold diuresis P:  Close monitoring chem Replace electrolytes as indicated  GASTROINTESTINAL A:  Transaminitis - shock liver, resolving Hepatitis C Ab positive, Hep A and B negative P: Send Hep C genotype and viral load once acute situation is addressed  HEMATOLOGIC DVT prev, at risk coagulapthy P:  SQ heparin   INFECTIOUS DISEASE HIV negative P:  Pan culture Start unasyn  ENDOCRINE A:  Mild hyperglycemia  P: Add SSI  NEUROLOGIC A:  Altered mental status s/p cardiac arrest and likely overdose Suicide attempt P: D/c EEG Will need suicide precautions/psyche eval depending on  neuro condition after rewarming Sedation as ordered, patient is a chronic used of benzos and narcs  Mother updated over the phone and all questions answered. Thornell Sartorius [phone]  TODAY'S SUMMARY: 23 y/o female with out of hospital cardiac arrest, awake and following commands, hemodynamically remains on pressor and now developing an aspiration pneumonia.  Will start abx and pan culture patient today.  I have personally obtained a history, examined the patient, evaluated laboratory and imaging results, formulated the assessment and plan and placed orders.  CRITICAL CARE: The patient is critically ill with multiple organ systems failure and requires high complexity decision making for assessment and support, frequent evaluation and titration of therapies, application of advanced monitoring technologies and extensive interpretation of multiple databases. Critical Care Time devoted to patient care services described in this note is 40 minutes.   Alyson Reedy, M.D. [facility] Pulmonary/Critical Care Medicine. Pager: [phone]. After hours pager: [phone].

## 2014-08-03 NOTE — Progress Notes (Signed)
ANTIBIOTIC CONSULT NOTE - INITIAL  Pharmacy Consult for unasyn Indication: pneumonia  No Known Allergies  Patient Measurements: Height:  (167.6 cm) Weight: 164 lb 15.5 oz (74.83 kg) IBW/kg (Calculated) : 59.3  Vital Signs: Temp: 99.7 F (37.6 C) (09/15 0800) Temp src: Core (Comment) (09/15 0800) BP: 90/52 mmHg (09/15 1000) Pulse Rate: 139 (09/15 1000) Intake/Output from previous day: 09/14 0701 - 09/15 0700 In: 2478.7 [I.V.:1928.7; NG/GT:50; IV Piggyback:500] Out: 795 [Urine:795] Intake/Output from this shift: Total I/O In: 85.1 [I.V.:85.1] Out: 325 [Urine:325]  Labs:  Recent Labs  07/31/14 2120  08/01/14 1107  08/02/14 0400  08/02/14 1600 08/02/14 2000 08/03/14 0600  WBC 12.1*  --   --   --  10.8*  --   --   --  11.0*  HGB 13.0  < > 14.6  --  12.7  --   --   --  10.9*  PLT 278  --   --   --  239  --   --   --  199  CREATININE 0.80  < > 0.20*  < > 0.34*  < > 0.48* 0.50 0.50  < > = values in this interval not displayed. Estimated Creatinine Clearance: 113.1 ml/min (by C-G formula based on Cr of 0.5). No results found for this basename: VANCOTROUGH, Leodis Binet, VANCORANDOM, GENTTROUGH, GENTPEAK, GENTRANDOM, TOBRATROUGH, TOBRAPEAK, TOBRARND, AMIKACINPEAK, AMIKACINTROU, AMIKACIN,  in the last 72 hours   Microbiology: Recent Results (from the past 720 hour(s))  MRSA PCR SCREENING     Status: Abnormal   Collection Time    08/01/14  2:25 AM      Result Value Ref Range Status   MRSA by PCR POSITIVE (*) NEGATIVE Final   Comment:            The GeneXpert MRSA Assay (FDA     approved for NASAL specimens     only), is one component of a     comprehensive MRSA colonization     surveillance program. It is not     intended to diagnose MRSA     infection nor to guide or     monitor treatment for     MRSA infections.     RESULT CALLED TO, READ BACK BY AND VERIFIED WITH:     STOWE,S RN [id number] AT 0355 SKEEN,P    Medical History: Past Medical History  Diagnosis  Date  . No pertinent past medical history   . Family history of varicose veins   . FHx: hypertension   . FHx: thyroid disease   . FHx: scoliosis   . H/O varicella   . Hx: UTI (urinary tract infection) 2012  . Postpartum eclampsia 08/2006  . Bacterial infection   . Heart murmur     Assessment: 23 year old s/p cardiac arrest currently on hypothermia protocol. No fevers d/t hypothermia protocol, wbc stable at 11. New orders received this am to start empiric unasyn for possible aspiration coverage. Normal renal function given age.  Goal of Therapy:  Eradication of infection  Plan:  Unasyn 3g IV q6 hours No further dose adjustments warranted, pharmacy to sign off and follow peripherally  Sheppard Coil PharmD., BCPS Clinical Pharmacist Pager [phone] 08/03/2014 11:15 AM

## 2014-08-04 ENCOUNTER — Inpatient Hospital Stay (HOSPITAL_COMMUNITY): Payer: Medicaid Other

## 2014-08-04 LAB — CBC
HCT: 35.2 % — ABNORMAL LOW (ref 36.0–46.0)
HEMOGLOBIN: 11.6 g/dL — AB (ref 12.0–15.0)
MCH: 28.7 pg (ref 26.0–34.0)
MCHC: 33 g/dL (ref 30.0–36.0)
MCV: 87.1 fL (ref 78.0–100.0)
Platelets: 227 10*3/uL (ref 150–400)
RBC: 4.04 MIL/uL (ref 3.87–5.11)
RDW: 14.3 % (ref 11.5–15.5)
WBC: 12.3 10*3/uL — AB (ref 4.0–10.5)

## 2014-08-04 LAB — BLOOD GAS, ARTERIAL
Acid-base deficit: 1.4 mmol/L (ref 0.0–2.0)
BICARBONATE: 23.5 meq/L (ref 20.0–24.0)
Drawn by: 39898
FIO2: 0.4 %
MECHVT: 500 mL
O2 Saturation: 98.6 %
PATIENT TEMPERATURE: 98.6
PEEP: 5 cmH2O
RATE: 16 resp/min
TCO2: 24.9 mmol/L (ref 0–100)
pCO2 arterial: 44.2 mmHg (ref 35.0–45.0)
pH, Arterial: 7.345 — ABNORMAL LOW (ref 7.350–7.450)
pO2, Arterial: 119 mmHg — ABNORMAL HIGH (ref 80.0–100.0)

## 2014-08-04 LAB — BASIC METABOLIC PANEL
Anion gap: 15 (ref 5–15)
BUN: 6 mg/dL (ref 6–23)
CHLORIDE: 104 meq/L (ref 96–112)
CO2: 21 meq/L (ref 19–32)
Calcium: 8.3 mg/dL — ABNORMAL LOW (ref 8.4–10.5)
Creatinine, Ser: 0.5 mg/dL (ref 0.50–1.10)
GFR calc Af Amer: >90 mL/min (ref >90)
GFR calc non Af Amer: >90 mL/min (ref >90)
GLUCOSE: 61 mg/dL — AB (ref 70–99)
POTASSIUM: 4.4 meq/L (ref 3.7–5.3)
SODIUM: 140 meq/L (ref 137–147)

## 2014-08-04 LAB — GLUCOSE, CAPILLARY
GLUCOSE-CAPILLARY: 52 mg/dL — AB (ref 70–99)
GLUCOSE-CAPILLARY: 64 mg/dL — AB (ref 70–99)
GLUCOSE-CAPILLARY: 67 mg/dL — AB (ref 70–99)
GLUCOSE-CAPILLARY: 71 mg/dL (ref 70–99)
GLUCOSE-CAPILLARY: 96 mg/dL (ref 70–99)
Glucose-Capillary: 74 mg/dL (ref 70–99)
Glucose-Capillary: 78 mg/dL (ref 70–99)
Glucose-Capillary: 82 mg/dL (ref 70–99)

## 2014-08-04 LAB — URINE CULTURE
COLONY COUNT: NO GROWTH
Culture: NO GROWTH

## 2014-08-04 LAB — PHOSPHORUS: Phosphorus: 2.3 mg/dL (ref 2.3–4.6)

## 2014-08-04 LAB — MAGNESIUM: MAGNESIUM: 1.6 mg/dL (ref 1.5–2.5)

## 2014-08-04 MED ORDER — FUROSEMIDE 10 MG/ML IJ SOLN
20.0000 mg | Freq: Four times a day (QID) | INTRAMUSCULAR | Status: AC
  Administered 2014-08-04 (×3): 20 mg via INTRAVENOUS
  Filled 2014-08-04 (×3): qty 2

## 2014-08-04 MED ORDER — DEXTROSE 50 % IV SOLN
INTRAVENOUS | Status: AC
  Administered 2014-08-04: 50 mL
  Filled 2014-08-04: qty 50

## 2014-08-04 MED ORDER — DEXTROSE 50 % IV SOLN
25.0000 mL | Freq: Once | INTRAVENOUS | Status: AC
  Administered 2014-08-04: 25 mL via INTRAVENOUS

## 2014-08-04 MED ORDER — CETYLPYRIDINIUM CHLORIDE 0.05 % MT LIQD
7.0000 mL | Freq: Two times a day (BID) | OROMUCOSAL | Status: DC
  Administered 2014-08-04 – 2014-08-05 (×3): 7 mL via OROMUCOSAL

## 2014-08-04 NOTE — Progress Notes (Signed)
    S/p extubation . Doing well per RN  Plan Clear liquid diet Dc superfluous orders   Dr. Kalman Shan, M.D., [facility].C.P Pulmonary and Critical Care Medicine Staff Physician [facility] and Critical [facility] Pager: [phone], If no answer or between  15:00h - 7:00h: call [phone]  08/04/2014 5:06 PM

## 2014-08-04 NOTE — Progress Notes (Addendum)
[employer] pads removed from patient and cooling blanket applied underneath patient; skin intact.  Machine on cooling mode (set point temp for patient 98.6); patient's current temp 99.7.  Will continue to monitor closely. [place], Mitzi Hansen

## 2014-08-04 NOTE — Progress Notes (Signed)
100 mL of Fentanyl and 35 mL of Versed wasted in sink, witnessed by 2 RNs-Jessica [place], RN & Josefa Half, RN

## 2014-08-04 NOTE — Progress Notes (Signed)
PULMONARY  / CRITICAL CARE MEDICINE   Name: [name] MRN: [id number] DOB: 1991-03-27    ADMISSION DATE:  07/31/2014  CHIEF COMPLAINT:  Cardiac arrest   BRIEF PATIENT DESCRIPTION: 23 year old with no significant past medical history presents via EMS being found pulseless after presumed OD.  At least & min downtime with EMS but unclear how long down prior to.   SIGNIFICANT EVENTS / STUDIES:  08/01/14 intubated in ED for airway protection 9/13 Echo> normal LV size and function, LVEF 65-70%, RVSP 45mm Hg 9/13 LE doppler > negative for DVT  LINES / TUBES: 08/01/14 L radial a line>>> 9/13 rt ij>>> 9/13 ETT>>>  CULTURES: Blood 9/15>>> Urine 9/15>>> Sputum 9/15>>>  ANTIBIOTICS: Unasyn 9/15>>>  SUBJECTIVE: Arousable and following commands.  PHYSICAL EXAM:  Temp:  [98.4 F (36.9 C)-100.2 F (37.9 C)] 99 F (37.2 C) (09/16 0000) Pulse Rate:  [114-160] 132 (09/16 0700) Resp:  [17-24] 24 (09/16 0336) BP: (81-119)/(48-71) 119/71 mmHg (09/16 0600) SpO2:  [97 %-100 %] 98 % (09/16 0700) Arterial Line BP: (95-161)/(44-98) 124/78 mmHg (09/16 0700) FiO2 (%):  [40 %] 40 % (09/16 0700) Weight:  [75.21 kg (165 lb 12.9 oz)] 75.21 kg (165 lb 12.9 oz) (09/16 0433) CVP:  [4 mmHg-5 mmHg] 5 mmHg  VENTILATOR SETTINGS: Vent Mode:  [-] SIMV/PC/PS FiO2 (%):  [40 %] 40 % Set Rate:  [16 bmp] 16 bmp Vt Set:  [500 mL] 500 mL PEEP:  [5 cmH20] 5 cmH20 Pressure Support:  [5 cmH20] 5 cmH20 Plateau Pressure:  [14 cmH20-18 cmH20] 18 cmH20  Gen: Sedated but able to follow commands and easily arousable HEENT: NCAT, ETT in place PULM: CTA B CV: RRR when sedate, once more arousable then sinus tach, no mgr, no JVD AB: BS infrequent, soft, limited by pads Ext: cool, no edema, no clubbing, no cyanosis Derm: no rash or skin breakdown Neuro: Awake, interactive, tracks and follows all commands.   Intake/Output     09/15 0701 - 09/16 0700 09/16 0701 - 09/17 0700   I.V. (mL/kg) 637.5 (8.5)    NG/GT 500     IV Piggyback 400    Total Intake(mL/kg) 1537.5 (20.4)    Urine (mL/kg/hr) 3950 (2.2)    Total Output 3950     Net -2412.5            Recent Labs Lab 08/02/14 0400 08/03/14 0600 08/04/14 0450  HGB 12.7 10.9* 11.6*  HCT 36.6 32.5* 35.2*  WBC 10.8* 11.0* 12.3*  PLT 239 199 227    Recent Labs Lab 08/02/14 1200 08/02/14 1600 08/02/14 2000 08/03/14 0600 08/04/14 0450  NA 139 139 138 140 140  K 3.5* 4.1 4.5 4.4 4.4  CL 107 109 110 109 104  CO2 GLUCOSE 121* 86 77 82 61*  BUN <3* <3* <3* <3* 6  CREATININE 0.39* 0.48* 0.50 0.50 0.50  CALCIUM 7.7* 7.4* 7.3* 7.7* 8.3*  MG  --   --   --   --  1.6  PHOS  --   --   --   --  2.3    Recent Labs Lab 07/31/14 2120 08/01/14 0500 08/02/14 0400  AST 194*  --  78*  ALT 257*  --  183*  ALKPHOS 95  --   --   BILITOT 0.3  --   --   PROT 6.7  --   --   ALBUMIN 3.4*  --   --   INR 1.09 1.10  --  Recent Labs Lab 07/31/14 2120 08/01/14 0500  INR 1.09 1.10   Cardiac Enzymes Recent Labs     08/01/14  1322  08/01/14  1824  08/02/14  0413  TROPONINI  <0.30  <0.30  <0.30   Glucose Recent Labs     08/03/14  2209  08/03/14  2316  08/04/14  0044  08/04/14  0303  08/04/14  0437  08/04/14  0643  GLUCAP  56*  67*  52*  96  67*  64*   UDS: +opiates, benzos, tetrahydrocannabinol NEG: salicilates, ethanol, acetaminophen   Imaging Dg Chest Port 1 View  08/03/2014   CLINICAL DATA:  23 year old female. Follow up endotracheal tube placement.  EXAM: PORTABLE CHEST - 1 VIEW  COMPARISON:  Prior chest x-ray 08/02/2014, 08/01/2014  FINDINGS: The cardiomediastinal silhouette unchanged in size and contour.  Lung volumes remain low with hilar opacities likely reflecting atelectasis. No pleural effusion or pneumothorax. No confluent airspace disease.  Defibrillator pads project over the chest.  Endotracheal tube remains in position, approximately 2.2 cm above the carina.  Unchanged position of right-sided IJ central  line which terminates in the right atrium.  Overlying EKG leads.  No displaced acute fracture identified.  IMPRESSION: Similar appearance the chest x-ray to the most recent prior with low lung volumes and likely hilar atelectasis.  Endotracheal tube is not significantly changed in position, measuring approximately 2.2 cm above the carina.  Unchanged right IJ central line.  Signed,  Yvone Neu. Loreta Ave, DO  [facility]  [facility]   Electronically Signed   By: Gilmer Mor O.D.   On: 08/03/2014 07:36   EKG: normal sinus rhythm CXR: clear CXR 9/14  ASSESSMENT / PLAN:  PULMONARY A: Respiratory failure s/p cardiac arrest  P: Begin PS trials today SBT in AM, if remains hemodynamically stable then will likely extubate in AM Titrate O2 down.  CARDIOVASCULAR A:  Witnessed cardiac arrest outside hospital - likely due to arrhythmia v primary resp arrest in setting overdose Sinus tach and febrile. P: Rewarmed but in sinus tachycardia when agitated 1 liter NS bolus x1 Control fever via normothermia and tylenol added Map goal 65 Tele Titrate Neo down as able Diureses as ordered  RENAL Hypokalemia , cold diuresis P:  Close monitoring chem Replace electrolytes as indicated Lasix 20 mg IV q6 x3 doses.  GASTROINTESTINAL A:  Transaminitis - shock liver, resolving Hepatitis C Ab positive, Hep A and B negative P: Send Hep C genotype and viral load once acute situation is addressed  HEMATOLOGIC DVT prev, at risk coagulapthy P:  SQ heparin   INFECTIOUS DISEASE HIV negative P:  Pan culture Continue unasyn  ENDOCRINE A:  Mild hyperglycemia  P: Add SSI  NEUROLOGIC A:  Altered mental status s/p cardiac arrest and likely overdose Suicide attempt P: D/c EEG Will need suicide precautions/psyche eval depending on neuro condition after rewarming Sedation as ordered, patient is a chronic used of benzos and narcs  Mother Thornell Sartorius  [phone].  TODAY'S SUMMARY: 23 y/o female with out of hospital cardiac arrest after drug overdose, awake and following commands, hemodynamically on and off pressor depending on level of agitation.  Diureses today and begin PS trials, anticipate will be able to extubate in AM.  I have personally obtained a history, examined the patient, evaluated laboratory and imaging results, formulated the assessment and plan and placed orders.  CRITICAL CARE: The patient is critically ill with multiple organ systems failure and requires high complexity decision  making for assessment and support, frequent evaluation and titration of therapies, application of advanced monitoring technologies and extensive interpretation of multiple databases. Critical Care Time devoted to patient care services described in this note is 35 minutes.   Alyson Reedy, M.D. [facility] Pulmonary/Critical Care Medicine. Pager: [phone]. After hours pager: [phone].

## 2014-08-04 NOTE — Progress Notes (Signed)
NUTRITION FOLLOW-UP  INTERVENTION:  Supplement diet as appropriate once advanced.   NUTRITION DIAGNOSIS: Inadequate oral intake related to inability to eat as evidenced by NPO status; ongoing.   Goal: Pt to meet >/= 90% of their estimated nutrition needs; not met.   Monitor:  Diet advancement, PO intake, weight trends, labs  ASSESSMENT: Pt with respiratory failure s/p cardiac arrest in setting of OD.   Pt extubated this am. RN in evaluating patient. Diet not yet advanced.   Height: Ht Readings from Last 1 Encounters:  08/01/14 '5\' 6"'  (1.676 m)    Weight: Wt Readings from Last 1 Encounters:  08/04/14 165 lb 12.9 oz (75.21 kg)    BMI:  Body mass index is 26.77 kg/(m^2).  Estimated Nutritional Needs: Kcal: 1700-1900 Protein: 80-90 grams Fluid: > 1.7 L/day  Skin: WDL  Diet Order: NPO   Intake/Output Summary (Last 24 hours) at 08/04/14 1205 Last data filed at 08/04/14 1200  Gross per 24 hour  Intake 1540.33 ml  Output   4775 ml  Net -3234.67 ml    Last BM: PTA   Labs:   Recent Labs Lab 08/02/14 2000 08/03/14 0600 08/04/14 0450  NA 138 140 140  K 4.5 4.4 4.4  CL 110 109 104  CO2 '19 21 21  ' BUN <3* <3* 6  CREATININE 0.50 0.50 0.50  CALCIUM 7.3* 7.7* 8.3*  MG  --   --  1.6  PHOS  --   --  2.3  GLUCOSE 77 82 61*    CBG (last 3)   Recent Labs  08/04/14 0437 08/04/14 0643 08/04/14 0843  GLUCAP 67* 64* 74    Scheduled Meds: . ampicillin-sulbactam (UNASYN) IV  3 g Intravenous Q6H  . antiseptic oral rinse  7 mL Mouth Rinse BID  . chlorhexidine  15 mL Mouth Rinse BID  . Chlorhexidine Gluconate Cloth  6 each Topical Q0600  . feeding supplement (PRO-STAT SUGAR FREE 64)  30 mL Per Tube TID  . furosemide  20 mg Intravenous Q6H  . heparin  5,000 Units Subcutaneous 3 times per day  . insulin aspart  0-15 Units Subcutaneous 6 times per day  . ipratropium-albuterol  3 mL Nebulization Q6H  . mupirocin ointment  1 application Nasal BID  . pantoprazole  (PROTONIX) IV  40 mg Intravenous QHS    Continuous Infusions: . cisatracurium (NIMBEX) infusion Stopped (08/02/14 1500)  . dextrose 5 % and 0.45% NaCl Stopped (08/04/14 1138)  . feeding supplement (JEVITY 1.2 CAL) Stopped (08/04/14 0730)  . fentaNYL infusion INTRAVENOUS Stopped (08/04/14 0500)  . midazolam (VERSED) infusion Stopped (08/04/14 0500)  . norepinephrine (LEVOPHED) Adult infusion Stopped (08/02/14 1636)  . phenylephrine (NEO-SYNEPHRINE) Adult infusion Stopped (08/03/14 1100)     [place], [place], CNSC [phone] Pager [phone] After Hours Pager

## 2014-08-04 NOTE — Procedures (Signed)
Extubation Procedure Note  Patient Details:   Name: [name] DOB: 11-05-1991 MRN: [id number]   Airway Documentation:     Evaluation  O2 sats: stable throughout Complications: No apparent complications Patient did tolerate procedure well. Bilateral Breath Sounds: Clear Suctioning: Airway Yes, pt able to speak. No stridor noted.  NO distress noted.   Jennette Kettle 08/04/2014, 8:01 AM

## 2014-08-04 NOTE — Progress Notes (Signed)
CARE MANAGEMENT NOTE 08/04/2014  Patient:  [name], [name]   Account Number:  [id number]  Date Initiated:  08/04/2014  Documentation initiated by:  Javaya [place]  Subjective/Objective Assessment:   patient admitted after intentional overdoseand then cardiac arrest/intubated in the ed..     Action/Plan:   tbd by psych   Anticipated DC Date:  08/07/2014   Anticipated DC Plan:  HOME/SELF CARE  In-house referral  Clinical Social Worker      DC Planning Services  CM consult      [facility] Choice  NA   Choice offered to / List presented to:  NA   DME arranged  NA      DME agency  NA     HH arranged  NA      HH agency  NA   Status of service:  In process, will continue to follow Medicare Important Message given?   (If response is "NO", the following Medicare IM given date fields will be blank) Date Medicare IM given:   Medicare IM given by:   Date Additional Medicare IM given:   Additional Medicare IM given by:    Discharge Disposition:    Per UR Regulation:  Reviewed for med. necessity/level of care/duration of stay  If discussed at Long Length of Stay Meetings, dates discussed:    Comments:  [id number]/Zeya Balles,RN,BSN,CCM: patient admitted with od and cardiac arrest on 09122015/intubated and post resucitation care given. Required iv pressors until 09152015/extubated am of 09162015/a.lines for pressure monitoring intact.

## 2014-08-05 ENCOUNTER — Inpatient Hospital Stay (HOSPITAL_COMMUNITY): Payer: Medicaid Other

## 2014-08-05 DIAGNOSIS — E876 Hypokalemia: Secondary | ICD-10-CM

## 2014-08-05 DIAGNOSIS — T400X1A Poisoning by opium, accidental (unintentional), initial encounter: Secondary | ICD-10-CM

## 2014-08-05 LAB — CBC
HCT: 30.9 % — ABNORMAL LOW (ref 36.0–46.0)
HEMOGLOBIN: 10.4 g/dL — AB (ref 12.0–15.0)
MCH: 29.1 pg (ref 26.0–34.0)
MCHC: 33.7 g/dL (ref 30.0–36.0)
MCV: 86.3 fL (ref 78.0–100.0)
PLATELETS: 216 10*3/uL (ref 150–400)
RBC: 3.58 MIL/uL — ABNORMAL LOW (ref 3.87–5.11)
RDW: 14 % (ref 11.5–15.5)
WBC: 8.7 10*3/uL (ref 4.0–10.5)

## 2014-08-05 LAB — GLUCOSE, CAPILLARY
GLUCOSE-CAPILLARY: 104 mg/dL — AB (ref 70–99)
GLUCOSE-CAPILLARY: 76 mg/dL (ref 70–99)
Glucose-Capillary: 73 mg/dL (ref 70–99)
Glucose-Capillary: 79 mg/dL (ref 70–99)
Glucose-Capillary: 91 mg/dL (ref 70–99)
Glucose-Capillary: 95 mg/dL (ref 70–99)

## 2014-08-05 LAB — BLOOD GAS, ARTERIAL
ACID-BASE EXCESS: 3.6 mmol/L — AB (ref 0.0–2.0)
Bicarbonate: 27.5 mEq/L — ABNORMAL HIGH (ref 20.0–24.0)
DRAWN BY: 41934
FIO2: 0.21 %
O2 Saturation: 93.1 %
PCO2 ART: 41.3 mmHg (ref 35.0–45.0)
PO2 ART: 64.5 mmHg — AB (ref 80.0–100.0)
Patient temperature: 98.6
TCO2: 28.8 mmol/L (ref 0–100)
pH, Arterial: 7.439 (ref 7.350–7.450)

## 2014-08-05 LAB — BASIC METABOLIC PANEL
ANION GAP: 15 (ref 5–15)
BUN: 9 mg/dL (ref 6–23)
CALCIUM: 8.4 mg/dL (ref 8.4–10.5)
CHLORIDE: 97 meq/L (ref 96–112)
CO2: 28 mEq/L (ref 19–32)
CREATININE: 0.59 mg/dL (ref 0.50–1.10)
GFR calc Af Amer: >90 mL/min (ref >90)
GFR calc non Af Amer: >90 mL/min (ref >90)
GLUCOSE: 81 mg/dL (ref 70–99)
Potassium: 3.1 mEq/L — ABNORMAL LOW (ref 3.7–5.3)
SODIUM: 140 meq/L (ref 137–147)

## 2014-08-05 LAB — PHOSPHORUS: PHOSPHORUS: 3.5 mg/dL (ref 2.3–4.6)

## 2014-08-05 LAB — MAGNESIUM: Magnesium: 1.9 mg/dL (ref 1.5–2.5)

## 2014-08-05 MED ORDER — POTASSIUM CHLORIDE CRYS ER 20 MEQ PO TBCR
40.0000 meq | EXTENDED_RELEASE_TABLET | Freq: Three times a day (TID) | ORAL | Status: AC
  Administered 2014-08-05 (×2): 40 meq via ORAL
  Filled 2014-08-05 (×2): qty 2

## 2014-08-05 MED ORDER — ALBUTEROL SULFATE (2.5 MG/3ML) 0.083% IN NEBU: 2.5000 mg | INHALATION_SOLUTION | RESPIRATORY_TRACT | Status: DC | PRN

## 2014-08-05 NOTE — Progress Notes (Signed)
PULMONARY  / CRITICAL CARE MEDICINE   Name: [name] MRN: [id number] DOB: 1991-04-12    ADMISSION DATE:  07/31/2014  CHIEF COMPLAINT:  Cardiac arrest   BRIEF PATIENT DESCRIPTION: 23 year old with no significant past medical history presents via EMS being found pulseless after presumed OD.  At least & min downtime with EMS but unclear how long down prior to.   SIGNIFICANT EVENTS / STUDIES:  08/01/14 intubated in ED for airway protection 9/13 Echo> normal LV size and function, LVEF 65-70%, RVSP 45mm Hg 9/13 LE doppler > negative for DVT  LINES / TUBES: 08/01/14 L radial a line>>>9/17 9/13 rt ij>>>9/17 9/13 ETT>>>9/16  CULTURES: Blood 9/15>>> Urine 9/15>>> Sputum 9/15>>>  ANTIBIOTICS: Unasyn 9/15>>>  SUBJECTIVE: Awake, interactive and following commands.  PHYSICAL EXAM:  Temp:  [97.9 F (36.6 C)-100.6 F (38.1 C)] 99 F (37.2 C) (09/17 0900) Pulse Rate:  [113-142] 115 (09/17 0900) BP: (83-101)/(40-69) 83/40 mmHg (09/17 0800) SpO2:  [92 %-100 %] 95 % (09/17 0900) Arterial Line BP: (74-132)/(48-74) 120/69 mmHg (09/17 0900) FiO2 (%):  [21 %] 21 % (09/17 0235) Weight:  [74.6 kg (164 lb 7.4 oz)] 74.6 kg (164 lb 7.4 oz) (09/17 0400) CVP:  [2 mmHg] 2 mmHg  VENTILATOR SETTINGS: Vent Mode:  [-]  FiO2 (%):  [21 %] 21 %  Gen: Awake and interactive, following all commands HEENT: NCAT, ETT in place PULM: CTA B CV: Sinus tach, Nl S1/S2, -M/R/G. AB: BS infrequent, soft, limited by pads Ext: cool, no edema, no clubbing, no cyanosis Derm: no rash or skin breakdown Neuro: Awake, interactive, tracks and follows all commands.   Intake/Output     09/16 0701 - 09/17 0700 09/17 0701 - 09/18 0700   I.V. (mL/kg) 220 (2.9)    NG/GT 10    IV Piggyback 400    Total Intake(mL/kg) 630 (8.4)    Urine (mL/kg/hr) 3300 (1.8)    Total Output 3300     Net -2670            Recent Labs Lab 08/03/14 0600 08/04/14 0450 08/05/14 0500  HGB 10.9* 11.6* 10.4*  HCT 32.5* 35.2* 30.9*   WBC 11.0* 12.3* 8.7  PLT 199 227 216    Recent Labs Lab 08/02/14 1600 08/02/14 2000 08/03/14 0600 08/04/14 0450 08/05/14 0500  NA 139 138 140 140 140  K 4.1 4.5 4.4 4.4 3.1*  CL 109 110 109 104 97  CO2 GLUCOSE 86 77 82 61* 81  BUN <3* <3* <3* 6 9  CREATININE 0.48* 0.50 0.50 0.50 0.59  CALCIUM 7.4* 7.3* 7.7* 8.3* 8.4  MG  --   --   --  1.6 1.9  PHOS  --   --   --  2.3 3.5    Recent Labs Lab 07/31/14 2120 08/01/14 0500 08/02/14 0400  AST 194*  --  78*  ALT 257*  --  183*  ALKPHOS 95  --   --   BILITOT 0.3  --   --   PROT 6.7  --   --   ALBUMIN 3.4*  --   --   INR 1.09 1.10  --     Recent Labs Lab 07/31/14 2120 08/01/14 0500  INR 1.09 1.10   Cardiac Enzymes No results found for this basename: TROPONINI, PROBNP,  in the last 72 hours Glucose Recent Labs     08/04/14  1212  08/04/14  1612  08/04/14  2007  08/05/14  0010  08/05/14  0419  08/05/14  0829  GLUCAP  78  82  71  76  73  79   UDS: +opiates, benzos, tetrahydrocannabinol NEG: salicilates, ethanol, acetaminophen   Imaging Dg Chest Port 1 View  08/05/2014   CLINICAL DATA:  Endotracheal tube placement.  EXAM: PORTABLE CHEST - 1 VIEW  COMPARISON:  08/04/2014.  FINDINGS: Support apparatus: Interval extubation. Removal of enteric tube. Monitoring leads project over the chest. Defibrillator pads continue to overlie the chest.  Cardiomediastinal Silhouette:  Within normal limits.  Lungs: Resolution of pulmonary edema. Mild basilar atelectasis. No pneumothorax.  Effusions:  None.  Other:  None.  IMPRESSION: 1. Interval extubation. 2. Resolved pulmonary edema with mild basilar atelectasis.   Electronically Signed   By: Andreas Newport M.D.   On: 08/05/2014 07:32   Dg Chest Port 1 View  08/04/2014   CLINICAL DATA:  Shortness of breath.  EXAM: PORTABLE CHEST - 1 VIEW  COMPARISON:  08/03/2014.  FINDINGS: The endotracheal tube is 3 cm above the carina. The NG tube is in the stomach. The right IJ  catheter tip is in the right atrium. This could be retracted 3 cm. The cardiac silhouette, mediastinal and hilar contours are stable. Slight worsening bibasilar atelectasis. No pleural effusion lobar pneumothorax. External pacer pad all is unchanged.  IMPRESSION: Stable support apparatus. The right IJ catheter tip remains in the right atrium.  Slight increase and bibasilar atelectasis.   Electronically Signed   By: Loralie Champagne M.D.   On: 08/04/2014 08:24   EKG: normal sinus rhythm CXR: clear CXR 9/14  ASSESSMENT / PLAN:  PULMONARY A: Respiratory failure s/p cardiac arrest  P: Titrate O2 down as able. IS per RT protocol. Titrate O2 down.  CARDIOVASCULAR A:  Witnessed cardiac arrest outside hospital - likely due to arrhythmia v primary resp arrest in setting overdose Sinus tach and febrile. P: Map goal 65 Tele D/C pressors Hold further diureses  RENAL Hypokalemia , cold diuresis P:  Close monitoring chem Replace electrolytes as indicated Hold further diureses at this point  GASTROINTESTINAL A:  Transaminitis - shock liver, resolving Hepatitis C Ab positive, Hep A and B negative P: Send Hep C genotype and viral load once acute situation is addressed  HEMATOLOGIC DVT prev, at risk coagulapthy P:  SQ heparin   INFECTIOUS DISEASE HIV negative P:  Pan culture Continue unasyn for a total of 8 days  ENDOCRINE A:  Mild hyperglycemia  P: Add SSI  NEUROLOGIC A:  Altered mental status s/p cardiac arrest and likely overdose Suicide attempt P: D/c EEG Will consult with psych and continue sitter until suicide is ruled out D/C sedation  TODAY'S SUMMARY: 23 y/o female with out of hospital cardiac arrest after drug overdose, ?suicide attempt vs accidental OD, extubated and doing well, will call psych to evaluate, sitter as ordered, suicide precautions.  Transfer to SDU and to [facility], PCCM will sign off, plese call back if needed.  I have personally obtained a history,  examined the patient, evaluated laboratory and imaging results, formulated the assessment and plan and placed orders.  CRITICAL CARE: The patient is critically ill with multiple organ systems failure and requires high complexity decision making for assessment and support, frequent evaluation and titration of therapies, application of advanced monitoring technologies and extensive interpretation of multiple databases. Critical Care Time devoted to patient care services described in this note is 35 minutes.   Alyson Reedy, M.D. [facility] Pulmonary/Critical Care Medicine. Pager: [phone]. After  hours pager: [phone].

## 2014-08-05 NOTE — Progress Notes (Signed)
Left radial arterial line removed per protocol. No complications. Site clean and dry; gauze dressing applied.  Dawson Bills, RN

## 2014-08-05 NOTE — Consult Note (Signed)
[facility] Face-to-Face Psychiatry Consult   Reason for Consult:  Overdose Referring Physician:  Dr. Jimmie Molly is an 23 y.o. female. 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 No pertinent past medical history   . Family history of varicose veins   . FHx: hypertension   . FHx: thyroid disease   . FHx: scoliosis   . H/O varicella   . Hx: UTI (urinary tract infection) 2012  . Postpartum eclampsia 08/2006  . Bacterial infection   . Heart murmur    AXIS IV:   boyfriend incarcerated  AXIS V:  41-50 serious symptoms  Plan:  No evidence of imminent risk to self or others at present.   Patient does not meet criteria for psychiatric inpatient admission.  Subjective:   Patient states she " really does not remember what happened "  HPI:   Patient is a 23 year  old female who was admitted due to overdose. Description from ED notes is that patient came out of a bathroom with a band around her arm and collapsed . Patient was pulseless and apneic when EMS arrived .  She required intubation and was extubated 9/16.  Paient is somewhat drowsy but able to cooperate with interview- she states she remembers having gone to a place " where my brother hangs out, but he was not there, and I guess the people there convinced me to do heroin". She stats " it is what I was told, because I really do not remember". She states overdose must have been accidental because she had no suicidal ideations. She does state she was depressed because her boyfriend was recently incarcerated, but denies any major neuro-vegetative symptoms of depression ( denies decreased self esteem, anhedonia, guilt, or changes in appetite or sleep pattern) .  She denies any prior overdoses, any history of suicide attempts, any history of self cutting or self injurious behaviors, and  any history of psychosis. She states she has never been treated with psychiatric medications or been in a psychiatric hospital. She acknowledges " occasionally " using opiates  But states" it is not regular" and does not consider herself dependent on opiates. She denies heavy drinking and states she only consumes alcohol occasionally. She states she has no history of IVDA, but as per above description, IV drug use suspected.  At this time she denies any suicidal ideations, and states " I do not want to die".     HPI Elements:   Severe opiate overdose, consider underlying  Opiate abuse.   Past Psychiatric History: Past Medical History  Diagnosis Date  . No pertinent past medical history   . Family history of varicose veins   . FHx: hypertension   . FHx: thyroid disease   . FHx: scoliosis   . H/O varicella   . Hx: UTI (urinary tract infection) 2012  . Postpartum eclampsia 08/2006  . Bacterial infection   . Heart murmur     reports that she has quit smoking. She does not have any smokeless tobacco history on file. She reports that she drinks alcohol. She reports that she uses illicit drugs (Marijuana). Family History  Problem Relation Age of Onset  . Anemia Mother   . Depression Mother   . Hypertension Father   . Heart disease Maternal Grandmother   . Depression Maternal Grandmother   .  Anesthesia problems Neg Hx   . Heart disease Paternal Grandfather      Living Arrangements: Parent     Allergies:  No Known Allergies   Objective: Blood pressure 101/58, pulse 116, temperature 98.4 F (36.9 C), temperature source Oral, resp. rate 20, height '5\' 6"'  (1.676 m), weight 74.6 kg (164 lb 7.4 oz), SpO2 96.00%, unknown if currently breastfeeding.Body mass index is 26.56 kg/(m^2). Results for orders placed during the hospital encounter of 07/31/14 (from the past 72 hour(s))  GLUCOSE, CAPILLARY     Status: None   Collection Time    08/02/14  3:54 PM      Result Value Ref Range    Glucose-Capillary 84  70 - 99 mg/dL  BASIC METABOLIC PANEL     Status: Abnormal   Collection Time    08/02/14  4:00 PM      Result Value Ref Range   Sodium 139  137 - 147 mEq/L   Potassium 4.1  3.7 - 5.3 mEq/L   Chloride 109  96 - 112 mEq/L   CO2 21  19 - 32 mEq/L   Glucose, Bld 86  70 - 99 mg/dL   BUN <3 (*) 6 - 23 mg/dL   Creatinine, Ser 0.48 (*) 0.50 - 1.10 mg/dL   Calcium 7.4 (*) 8.4 - 10.5 mg/dL   GFR calc non Af Amer >90  >90 mL/min   GFR calc Af Amer >90  >90 mL/min   Comment: (NOTE)     The eGFR has been calculated using the CKD EPI equation.     This calculation has not been validated in all clinical situations.     eGFR's persistently <90 mL/min signify possible Chronic Kidney     Disease.   Anion gap 9  5 - 15  BASIC METABOLIC PANEL     Status: Abnormal   Collection Time    08/02/14  8:00 PM      Result Value Ref Range   Sodium 138  137 - 147 mEq/L   Potassium 4.5  3.7 - 5.3 mEq/L   Chloride 110  96 - 112 mEq/L   CO2 19  19 - 32 mEq/L   Glucose, Bld 77  70 - 99 mg/dL   BUN <3 (*) 6 - 23 mg/dL   Creatinine, Ser 0.50  0.50 - 1.10 mg/dL   Calcium 7.3 (*) 8.4 - 10.5 mg/dL   GFR calc non Af Amer >90  >90 mL/min   GFR calc Af Amer >90  >90 mL/min   Comment: (NOTE)     The eGFR has been calculated using the CKD EPI equation.     This calculation has not been validated in all clinical situations.     eGFR's persistently <90 mL/min signify possible Chronic Kidney     Disease.   Anion gap 9  5 - 15  GLUCOSE, CAPILLARY     Status: None   Collection Time    08/02/14  8:02 PM      Result Value Ref Range   Glucose-Capillary 76  70 - 99 mg/dL  GLUCOSE, CAPILLARY     Status: None   Collection Time    08/02/14 11:25 PM      Result Value Ref Range   Glucose-Capillary 84  70 - 99 mg/dL   Comment 1 Arterial Sample    BLOOD GAS, ARTERIAL     Status: Abnormal   Collection Time    08/03/14  3:49 AM      Result  Value Ref Range   FIO2 0.40     Delivery systems VENTILATOR      Mode PRESSURE REGULATED VOLUME CONTROL     VT 500     Rate 16     Peep/cpap 5.0     pH, Arterial 7.284 (*) 7.350 - 7.450   pCO2 arterial 45.9 (*) 35.0 - 45.0 mmHg   pO2, Arterial 110.0 (*) 80.0 - 100.0 mmHg   Bicarbonate 21.1  20.0 - 24.0 mEq/L   TCO2 22.5  0 - 100 mmol/L   Acid-base deficit 4.5 (*) 0.0 - 2.0 mmol/L   O2 Saturation 98.2     Patient temperature 98.6     Collection site A-LINE     Drawn by [phone]     Sample type ARTERIAL DRAW     Allens test (pass/fail) PASS  PASS  GLUCOSE, CAPILLARY     Status: None   Collection Time    08/03/14  4:09 AM      Result Value Ref Range   Glucose-Capillary 82  70 - 99 mg/dL   Comment 1 Arterial Sample    BASIC METABOLIC PANEL     Status: Abnormal   Collection Time    08/03/14  6:00 AM      Result Value Ref Range   Sodium 140  137 - 147 mEq/L   Potassium 4.4  3.7 - 5.3 mEq/L   Chloride 109  96 - 112 mEq/L   CO2 21  19 - 32 mEq/L   Glucose, Bld 82  70 - 99 mg/dL   BUN <3 (*) 6 - 23 mg/dL   Creatinine, Ser 0.50  0.50 - 1.10 mg/dL   Calcium 7.7 (*) 8.4 - 10.5 mg/dL   GFR calc non Af Amer >90  >90 mL/min   GFR calc Af Amer >90  >90 mL/min   Comment: (NOTE)     The eGFR has been calculated using the CKD EPI equation.     This calculation has not been validated in all clinical situations.     eGFR's persistently <90 mL/min signify possible Chronic Kidney     Disease.   Anion gap 10  5 - 15  CBC WITH DIFFERENTIAL     Status: Abnormal   Collection Time    08/03/14  6:00 AM      Result Value Ref Range   WBC 11.0 (*) 4.0 - 10.5 K/uL   RBC 3.71 (*) 3.87 - 5.11 MIL/uL   Hemoglobin 10.9 (*) 12.0 - 15.0 g/dL   HCT 32.5 (*) 36.0 - 46.0 %   MCV 87.6  78.0 - 100.0 fL   MCH 29.4  26.0 - 34.0 pg   MCHC 33.5  30.0 - 36.0 g/dL   RDW 14.2  11.5 - 15.5 %   Platelets 199  150 - 400 K/uL   Neutrophils Relative % 81 (*) 43 - 77 %   Neutro Abs 8.9 (*) 1.7 - 7.7 K/uL   Lymphocytes Relative 11 (*) 12 - 46 %   Lymphs Abs 1.2  0.7 - 4.0 K/uL    Monocytes Relative 6  3 - 12 %   Monocytes Absolute 0.7  0.1 - 1.0 K/uL   Eosinophils Relative 2  0 - 5 %   Eosinophils Absolute 0.2  0.0 - 0.7 K/uL   Basophils Relative 0  0 - 1 %   Basophils Absolute 0.0  0.0 - 0.1 K/uL  GLUCOSE, CAPILLARY     Status: Abnormal   Collection Time  08/03/14  8:18 AM      Result Value Ref Range   Glucose-Capillary 61 (*) 70 - 99 mg/dL   Comment 1 Venous Sample    GLUCOSE, CAPILLARY     Status: None   Collection Time    08/03/14  8:53 AM      Result Value Ref Range   Glucose-Capillary 74  70 - 99 mg/dL   Comment 1 Capillary Sample    GLUCOSE, CAPILLARY     Status: None   Collection Time    08/03/14  9:26 AM      Result Value Ref Range   Glucose-Capillary 74  70 - 99 mg/dL   Comment 1 Capillary Sample    CULTURE, RESPIRATORY (NON-EXPECTORATED)     Status: None   Collection Time    08/03/14 12:07 PM      Result Value Ref Range   Specimen Description ENDOTRACHEAL     Special Requests NONE     Gram Stain       Value: ABUNDANT WBC PRESENT,BOTH PMN AND MONONUCLEAR     RARE SQUAMOUS EPITHELIAL CELLS PRESENT     ABUNDANT GRAM NEGATIVE RODS     Performed at [facility]   Culture       Value: ABUNDANT STREPTOCOCCUS PNEUMONIAE     Performed at [facility]   Report Status PENDING    CULTURE, BLOOD (ROUTINE X 2)     Status: None   Collection Time    08/03/14 12:16 PM      Result Value Ref Range   Specimen Description BLOOD RIGHT THUMB     Special Requests BOTTLES DRAWN AEROBIC ONLY 2CC     Culture  Setup Time       Value: 08/03/2014 18:55     Performed at [facility]   Culture       Value:        BLOOD CULTURE RECEIVED NO GROWTH TO DATE CULTURE WILL BE HELD FOR 5 DAYS BEFORE ISSUING A FINAL NEGATIVE REPORT     Performed at [facility]   Report Status PENDING    GLUCOSE, CAPILLARY     Status: Abnormal   Collection Time    08/03/14 12:39 PM      Result Value Ref Range   Glucose-Capillary 51 (*) 70 - 99 mg/dL    Comment 1 Capillary Sample    GLUCOSE, CAPILLARY     Status: Abnormal   Collection Time    08/03/14  1:56 PM      Result Value Ref Range   Glucose-Capillary 69 (*) 70 - 99 mg/dL   Comment 1 Capillary Sample    CULTURE, BLOOD (ROUTINE X 2)     Status: None   Collection Time    08/03/14  3:00 PM      Result Value Ref Range   Specimen Description BLOOD RIGHT HAND     Special Requests BOTTLES DRAWN AEROBIC ONLY 2CC     Culture  Setup Time       Value: 08/03/2014 18:58     Performed at [facility]   Culture       Value:        BLOOD CULTURE RECEIVED NO GROWTH TO DATE CULTURE WILL BE HELD FOR 5 DAYS BEFORE ISSUING A FINAL NEGATIVE REPORT     Performed at [facility]   Report Status PENDING    GLUCOSE, CAPILLARY     Status: Abnormal   Collection Time  08/03/14  4:26 PM      Result Value Ref Range   Glucose-Capillary 54 (*) 70 - 99 mg/dL   Comment 1 Arterial Sample    URINE CULTURE     Status: None   Collection Time    08/03/14  4:30 PM      Result Value Ref Range   Specimen Description URINE, CATHETERIZED     Special Requests NONE     Culture  Setup Time       Value: 08/03/2014 18:13     Performed at [facility]       Value: NO GROWTH     Performed at [facility]   Culture       Value: NO GROWTH     Performed at [facility]   Report Status 08/04/2014 FINAL    URINALYSIS, ROUTINE W REFLEX MICROSCOPIC     Status: Abnormal   Collection Time    08/03/14  4:30 PM      Result Value Ref Range   Color, Urine YELLOW  YELLOW   APPearance CLEAR  CLEAR   Specific Gravity, Urine 1.011  1.005 - 1.030   pH 5.0  5.0 - 8.0   Glucose, UA NEGATIVE  NEGATIVE mg/dL   Hgb urine dipstick TRACE (*) NEGATIVE   Bilirubin Urine NEGATIVE  NEGATIVE   Ketones, ur 40 (*) NEGATIVE mg/dL   Protein, ur NEGATIVE  NEGATIVE mg/dL   Urobilinogen, UA 0.2  0.0 - 1.0 mg/dL   Nitrite NEGATIVE  NEGATIVE   Leukocytes, UA NEGATIVE  NEGATIVE   URINE MICROSCOPIC-ADD ON     Status: None   Collection Time    08/03/14  4:30 PM      Result Value Ref Range   Squamous Epithelial / LPF RARE  RARE   WBC, UA 0-2  <3 WBC/hpf   RBC / HPF 0-2  <3 RBC/hpf   Bacteria, UA RARE  RARE   Urine-Other MUCOUS PRESENT    GLUCOSE, CAPILLARY     Status: Abnormal   Collection Time    08/03/14  6:25 PM      Result Value Ref Range   Glucose-Capillary 64 (*) 70 - 99 mg/dL   Comment 1 Arterial Sample    GLUCOSE, CAPILLARY     Status: Abnormal   Collection Time    08/03/14  8:19 PM      Result Value Ref Range   Glucose-Capillary 58 (*) 70 - 99 mg/dL   Comment 1 Capillary Sample    GLUCOSE, CAPILLARY     Status: Abnormal   Collection Time    08/03/14 10:09 PM      Result Value Ref Range   Glucose-Capillary 56 (*) 70 - 99 mg/dL  GLUCOSE, CAPILLARY     Status: Abnormal   Collection Time    08/03/14 11:16 PM      Result Value Ref Range   Glucose-Capillary 67 (*) 70 - 99 mg/dL  GLUCOSE, CAPILLARY     Status: Abnormal   Collection Time    08/04/14 12:44 AM      Result Value Ref Range   Glucose-Capillary 52 (*) 70 - 99 mg/dL  GLUCOSE, CAPILLARY     Status: None   Collection Time    08/04/14  3:03 AM      Result Value Ref Range   Glucose-Capillary 96  70 - 99 mg/dL   Comment 1 Arterial Sample    GLUCOSE, CAPILLARY  Status: Abnormal   Collection Time    08/04/14  4:37 AM      Result Value Ref Range   Glucose-Capillary 67 (*) 70 - 99 mg/dL  CBC     Status: Abnormal   Collection Time    08/04/14  4:50 AM      Result Value Ref Range   WBC 12.3 (*) 4.0 - 10.5 K/uL   RBC 4.04  3.87 - 5.11 MIL/uL   Hemoglobin 11.6 (*) 12.0 - 15.0 g/dL   HCT 35.2 (*) 36.0 - 46.0 %   MCV 87.1  78.0 - 100.0 fL   MCH 28.7  26.0 - 34.0 pg   MCHC 33.0  30.0 - 36.0 g/dL   RDW 14.3  11.5 - 15.5 %   Platelets 227  150 - 400 K/uL  BASIC METABOLIC PANEL     Status: Abnormal   Collection Time    08/04/14  4:50 AM      Result Value Ref Range   Sodium 140  137 -  147 mEq/L   Potassium 4.4  3.7 - 5.3 mEq/L   Chloride 104  96 - 112 mEq/L   CO2 21  19 - 32 mEq/L   Glucose, Bld 61 (*) 70 - 99 mg/dL   BUN 6  6 - 23 mg/dL   Creatinine, Ser 0.50  0.50 - 1.10 mg/dL   Calcium 8.3 (*) 8.4 - 10.5 mg/dL   GFR calc non Af Amer >90  >90 mL/min   GFR calc Af Amer >90  >90 mL/min   Comment: (NOTE)     The eGFR has been calculated using the CKD EPI equation.     This calculation has not been validated in all clinical situations.     eGFR's persistently <90 mL/min signify possible Chronic Kidney     Disease.   Anion gap 15  5 - 15  MAGNESIUM     Status: None   Collection Time    08/04/14  4:50 AM      Result Value Ref Range   Magnesium 1.6  1.5 - 2.5 mg/dL  PHOSPHORUS     Status: None   Collection Time    08/04/14  4:50 AM      Result Value Ref Range   Phosphorus 2.3  2.3 - 4.6 mg/dL  BLOOD GAS, ARTERIAL     Status: Abnormal   Collection Time    08/04/14  5:03 AM      Result Value Ref Range   FIO2 0.40     Delivery systems VENTILATOR     Mode PRESSURE REGULATED VOLUME CONTROL     VT 500     Rate 16     Peep/cpap 5.0     pH, Arterial 7.345 (*) 7.350 - 7.450   pCO2 arterial 44.2  35.0 - 45.0 mmHg   pO2, Arterial 119.0 (*) 80.0 - 100.0 mmHg   Bicarbonate 23.5  20.0 - 24.0 mEq/L   TCO2 24.9  0 - 100 mmol/L   Acid-base deficit 1.4  0.0 - 2.0 mmol/L   O2 Saturation 98.6     Patient temperature 98.6     Collection site ARTERIAL LINE     Drawn by [phone]     Sample type ARTERIAL DRAW     Allens test (pass/fail) PASS  PASS  GLUCOSE, CAPILLARY     Status: Abnormal   Collection Time    08/04/14  6:43 AM      Result Value Ref Range  Glucose-Capillary 64 (*) 70 - 99 mg/dL   Comment 1 Capillary Sample    GLUCOSE, CAPILLARY     Status: None   Collection Time    08/04/14  8:43 AM      Result Value Ref Range   Glucose-Capillary 74  70 - 99 mg/dL   Comment 1 Capillary Sample    GLUCOSE, CAPILLARY     Status: None   Collection Time    08/04/14 12:12  PM      Result Value Ref Range   Glucose-Capillary 78  70 - 99 mg/dL   Comment 1 Capillary Sample    GLUCOSE, CAPILLARY     Status: None   Collection Time    08/04/14  4:12 PM      Result Value Ref Range   Glucose-Capillary 82  70 - 99 mg/dL   Comment 1 Capillary Sample    GLUCOSE, CAPILLARY     Status: None   Collection Time    08/04/14  8:07 PM      Result Value Ref Range   Glucose-Capillary 71  70 - 99 mg/dL   Comment 1 Venous Sample     Comment 2 Capillary Sample    GLUCOSE, CAPILLARY     Status: None   Collection Time    08/05/14 12:10 AM      Result Value Ref Range   Glucose-Capillary 76  70 - 99 mg/dL  BLOOD GAS, ARTERIAL     Status: Abnormal   Collection Time    08/05/14  3:46 AM      Result Value Ref Range   pH, Arterial 7.439  7.350 - 7.450   pCO2 arterial 41.3  35.0 - 45.0 mmHg   pO2, Arterial 64.5 (*) 80.0 - 100.0 mmHg   Bicarbonate 27.5 (*) 20.0 - 24.0 mEq/L   TCO2 28.8  0 - 100 mmol/L   Acid-Base Excess 3.6 (*) 0.0 - 2.0 mmol/L   O2 Saturation 93.1     Patient temperature 98.6     Collection site A-LINE     Drawn by [phone]     Sample type ARTERIAL DRAW     Allens test (pass/fail) PASS  PASS  GLUCOSE, CAPILLARY     Status: None   Collection Time    08/05/14  4:19 AM      Result Value Ref Range   Glucose-Capillary 73  70 - 99 mg/dL   Comment 1 Capillary Sample    CBC     Status: Abnormal   Collection Time    08/05/14  5:00 AM      Result Value Ref Range   WBC 8.7  4.0 - 10.5 K/uL   RBC 3.58 (*) 3.87 - 5.11 MIL/uL   Hemoglobin 10.4 (*) 12.0 - 15.0 g/dL   HCT 30.9 (*) 36.0 - 46.0 %   MCV 86.3  78.0 - 100.0 fL   MCH 29.1  26.0 - 34.0 pg   MCHC 33.7  30.0 - 36.0 g/dL   RDW 14.0  11.5 - 15.5 %   Platelets 216  150 - 400 K/uL  BASIC METABOLIC PANEL     Status: Abnormal   Collection Time    08/05/14  5:00 AM      Result Value Ref Range   Sodium 140  137 - 147 mEq/L   Potassium 3.1 (*) 3.7 - 5.3 mEq/L   Chloride 97  96 - 112 mEq/L   CO2 28  19 - 32  mEq/L   Glucose,  Bld 81  70 - 99 mg/dL   BUN 9  6 - 23 mg/dL   Creatinine, Ser 0.59  0.50 - 1.10 mg/dL   Calcium 8.4  8.4 - 10.5 mg/dL   GFR calc non Af Amer >90  >90 mL/min   GFR calc Af Amer >90  >90 mL/min   Comment: (NOTE)     The eGFR has been calculated using the CKD EPI equation.     This calculation has not been validated in all clinical situations.     eGFR's persistently <90 mL/min signify possible Chronic Kidney     Disease.   Anion gap 15  5 - 15  MAGNESIUM     Status: None   Collection Time    08/05/14  5:00 AM      Result Value Ref Range   Magnesium 1.9  1.5 - 2.5 mg/dL  PHOSPHORUS     Status: None   Collection Time    08/05/14  5:00 AM      Result Value Ref Range   Phosphorus 3.5  2.3 - 4.6 mg/dL  GLUCOSE, CAPILLARY     Status: None   Collection Time    08/05/14  8:29 AM      Result Value Ref Range   Glucose-Capillary 79  70 - 99 mg/dL   Comment 1 Capillary Sample    GLUCOSE, CAPILLARY     Status: None   Collection Time    08/05/14 11:29 AM      Result Value Ref Range   Glucose-Capillary 91  70 - 99 mg/dL   Labs are reviewed and are pertinent for improving leukocytosis, anemia. Admission UDS positive for opiates,  benzodiazepines, THC. Negative alcohol level upon admission.  Current Facility-Administered Medications  Medication Dose Route Frequency Provider Last Rate Last Dose  . albuterol (PROVENTIL) (2.5 MG/3ML) 0.083% nebulizer solution 2.5 mg  2.5 mg Nebulization Q4H PRN Rush Farmer, MD      . Ampicillin-Sulbactam (UNASYN) 3 g in sodium chloride 0.9 % 100 mL IVPB  3 g Intravenous Q6H Georgina Peer, RPH   3 g at 08/05/14 1146  . antiseptic oral rinse (CPC / CETYLPYRIDINIUM CHLORIDE 0.05%) solution 7 mL  7 mL Mouth Rinse BID Raylene Miyamoto, MD   7 mL at 08/05/14 1000  . chlorhexidine (PERIDEX) 0.12 % solution 15 mL  15 mL Mouth Rinse BID Raylene Miyamoto, MD   15 mL at 08/05/14 0853  . dextrose 5 %-0.45 % sodium chloride infusion   Intravenous  Continuous Brand Males, MD 10 mL/hr at 08/05/14 1000    . heparin injection 5,000 Units  5,000 Units Subcutaneous 3 times per day Crecencio Mc, MD   5,000 Units at 08/05/14 0533  . insulin aspart (novoLOG) injection 0-15 Units  0-15 Units Subcutaneous 6 times per day Marijean Heath, NP   2 Units at 08/02/14 1200  . mupirocin ointment (BACTROBAN) 2 % 1 application  1 application Nasal BID Raylene Miyamoto, MD   1 application at 91/91/66 [phone]  . pantoprazole (PROTONIX) injection 40 mg  40 mg Intravenous QHS Crecencio Mc, MD   40 mg at 08/04/14 2212  . potassium chloride SA (K-DUR,KLOR-CON) CR tablet 40 mEq  40 mEq Oral TID Rush Farmer, MD   40 mEq at 08/05/14 1043    Psychiatric Specialty Exam:     Blood pressure 101/58, pulse 116, temperature 98.4 F (36.9 C), temperature source Oral, resp. rate 20, height '5\' 6"'  (1.676 m),  weight 74.6 kg (164 lb 7.4 oz), SpO2 96.00%, unknown if currently breastfeeding.Body mass index is 26.56 kg/(m^2).  General Appearance: Fairly Groomed  Engineer, water::  Fair  Speech:  Slow  Volume:  Decreased  Mood:  mildly depressed   Affect:  Constricted and but reactive, does smile briefly at times   Thought Process:  Goal Directed and Linear  Orientation:  Other:  oriented to hospital, to day of week, but stated 2014, and had difficulty remembering month  Thought Content:  denies hallucinations, no delusions, does not appear internally preoccupied  Suicidal Thoughts:  No- denies any suicidal ideations, denies any homicidal or violent ideations  Homicidal Thoughts:  No  Memory:  recent poor, remote grossly intact   Judgement:  Fair  Insight:  Fair  Psychomotor Activity:  Decreased  Concentration:  Good  Recall:  Good, except for specific overdose event  Fund of Knowledge:Good  Language: Fair  Akathisia:  No  Handed:  Right  AIMS (if indicated):     Assets:  Desire for Improvement Resilience  Sleep:      Musculoskeletal: Strength & Muscle Tone:  patient in hospital bed, gait/ strength  not examined, but no gross evidence of lateralization  Gait & Station: as above  Patient leans: N/A  RECOMMENDATIONS 1. As above , overdose described as accidental overdose , most likely on opiate such as heroin, and mostly IV route. There is no current indication for involuntary commitment or admission to inpatient psychiatric unit.  ( Patient not currently suicidal, and although does not remember overdose event, she denies any recent suicidal ideations or any severe depressive symptoms.)  2. At this time no standing psychiatric medication indicated. Would continue to monitor mood and affect to insure current depressive symptoms do not persist.  3. Patient will benefit from referral to substance abuse focused counseling or therapy, and recommendation of full abstinence from any addictive substance .    COBOS, Felicita Gage 08/05/2014 3:17 PM

## 2014-08-05 NOTE — Progress Notes (Signed)
Spoke with family about the need to continue wearing gowns and gloves for contact precautions. The family acknowledged understanding.   Dawson Bills, RN

## 2014-08-05 NOTE — Progress Notes (Signed)
Transferred patient to [facility]. Medications and personal belongings sent with patient. Gave report to [place], Charity fundraiser.

## 2014-08-06 DIAGNOSIS — T40601A Poisoning by unspecified narcotics, accidental (unintentional), initial encounter: Secondary | ICD-10-CM | POA: Diagnosis present

## 2014-08-06 DIAGNOSIS — J154 Pneumonia due to other streptococci: Secondary | ICD-10-CM | POA: Diagnosis present

## 2014-08-06 DIAGNOSIS — B1920 Unspecified viral hepatitis C without hepatic coma: Secondary | ICD-10-CM | POA: Diagnosis present

## 2014-08-06 LAB — BASIC METABOLIC PANEL
Anion gap: 9 (ref 5–15)
BUN: 6 mg/dL (ref 6–23)
CHLORIDE: 103 meq/L (ref 96–112)
CO2: 29 mEq/L (ref 19–32)
Calcium: 8.6 mg/dL (ref 8.4–10.5)
Creatinine, Ser: 0.6 mg/dL (ref 0.50–1.10)
Glucose, Bld: 94 mg/dL (ref 70–99)
Potassium: 3.6 mEq/L — ABNORMAL LOW (ref 3.7–5.3)
SODIUM: 141 meq/L (ref 137–147)

## 2014-08-06 LAB — CBC
HCT: 33 % — ABNORMAL LOW (ref 36.0–46.0)
Hemoglobin: 11.1 g/dL — ABNORMAL LOW (ref 12.0–15.0)
MCH: 29.2 pg (ref 26.0–34.0)
MCHC: 33.6 g/dL (ref 30.0–36.0)
MCV: 86.8 fL (ref 78.0–100.0)
Platelets: 203 10*3/uL (ref 150–400)
RBC: 3.8 MIL/uL — ABNORMAL LOW (ref 3.87–5.11)
RDW: 14 % (ref 11.5–15.5)
WBC: 7 10*3/uL (ref 4.0–10.5)

## 2014-08-06 LAB — GLUCOSE, CAPILLARY
GLUCOSE-CAPILLARY: 112 mg/dL — AB (ref 70–99)
Glucose-Capillary: 151 mg/dL — ABNORMAL HIGH (ref 70–99)
Glucose-Capillary: 79 mg/dL (ref 70–99)
Glucose-Capillary: 90 mg/dL (ref 70–99)

## 2014-08-06 LAB — PHOSPHORUS: PHOSPHORUS: 1.7 mg/dL — AB (ref 2.3–4.6)

## 2014-08-06 LAB — MAGNESIUM: Magnesium: 2 mg/dL (ref 1.5–2.5)

## 2014-08-06 MED ORDER — PANTOPRAZOLE SODIUM 40 MG PO TBEC: 40.0000 mg | DELAYED_RELEASE_TABLET | Freq: Every day | ORAL | Status: DC

## 2014-08-06 MED ORDER — LEVOFLOXACIN 500 MG PO TABS: 500.0000 mg | ORAL_TABLET | Freq: Every day | ORAL | Status: DC

## 2014-08-06 NOTE — Discharge Summary (Signed)
Physician Discharge Summary  [name]:[id number] DOB: Aug 10, 1991 DOA: 07/31/2014  PCP: Michael Litter, MD  Admit date: 07/31/2014 Discharge date: 08/06/2014  Time spent: >30 minutes  Recommendations for Outpatient Follow-up:  1. Case manager has called [facility] to schedule follow up-Dr Normand Sloop was her OBGYN 2. Hepatitis C antibody is positive- she needs HCV RNA in follow up - pt counseled on the importance of following up to confirm diagnosis then have other studies such as viral load checked to determine eligibility for anti viral treatment (likely not currently eligible due to substance abuse) 3. Counseled regarding absolute cessation of ALL illegal and nonprescribed opiates and benzodiazipines 4. Also needs LFTs drawn at follow up visit 5. Social Work arranged an appointment at [employer] given to patient and her father  Discharge Diagnoses:    Cardiac arrest post opiate overdose - stable   Acute respiratory distress due to narcotic OD - resolved   Streptococcal PNA   Overdose of opiate or related narcotic/unintentional   Hepatitis C antibody positive  Discharge Condition: stable  Diet recommendation: Regular  Filed Weights   08/05/14 0400 08/05/14 2216 08/06/14 0425  Weight: 164 lb 7.4 oz (74.6 kg) 149 lb 4 oz (67.7 kg) 151 lb 0.2 oz (68.5 kg)    History of present illness:  23 year old female patient with suspected history of polysubstance abuse. Went to a friend's house and was later seen emerging from the bathroom with a tourniquet around her arm. This was followed by unresponsiveness and loss of pulse. EMS was called to the home and began CPR with return of spontaneous circulation. The patient was given Narcan prior to arrival. Unclear how long the patient was pulseless.  Upon arrival to the ER she was intubated and appeared to be posturing. According to family/friends at the bedside she may have had some experience with her when the past. The  patient's mother reported that she had been severely depressed in the past 2 months due to multiple stressors. The patient has been living with her grandmother and taking care of her 3 children. No other physiologic symptoms reported by the patient according to her family.  She was subsequently admitted to the ICU with induced hypothermia. Post cardiac arrest echo showed normal LV size and function. Lower extremity duplex was negative for DVT. She was eventually extubated on 9/16. At admission she had been started on empiric Unasyn. Pancultures were negative except for tracheal aspirate which was positive for streptococcal pneumonia. HIV was negative but hepatitis C antibody was positive.  Patient eventually returned to baseline mentation. She was evaluated by the psychiatrist on 9/17 who felt that the patient demonstrated no evidence of eminent risk to self or others and did not meet criteria for psychiatric inpatient admission.  Hospital Course:   Cardiac arrest in setting of unintentional opiate overdose Diagnostic evaluation after arrival demonstrated no physiologic cause such as cardiac ischemia or PE and it was presumed cardiac arrest was due to an acute respiratory failure with hypoxia in setting of opiate overdose. Patient was quickly extubated and postextubation chest x-ray without evidence of pneumonia only atelectasis  Acute respiratory failure with hypoxia/streptococcal pneumonia While intubated a tracheal aspirate culture was sent and was positive for streptococcal pneumonia. Had been empirically treated with Unasyn and on date of discharge final culture available so will discharge home on an additional 5 days of Levaquin.  Unintentional opiate overdose in patient with reported depression In discussing with the patient today she is  not truly forthcoming in regards to the degree of her substance abuse history. She has been evaluated by psychiatry and deemed not appropriate as inpatient  admission for treatment. Prior to discharge we have asked the psychiatric social worker to provide the patient and her family with outpatient options for polysubstance/drug abuse treatment. Patient did agree to [place] discussing her medical problems including her substance abuse with her father who is at the bedside.  Hepatitis C antibody positive No definitive history of prior infection. Hepatitis antibody was positive. In review of admission laboratory data patient's AST as well as ALT were elevated noting AST 195 and ALT 257 with normal total bilirubin and presumed related to shock and cardiac arrest setting. Followup serologies LFTs will need to be repeated after discharge. In addition she will need an HCV RNA to determine if she has chronic hepatitis C.  Procedures: 2-D echocardiogram with bubble study:- Left ventricle: The cavity size was normal. Wall thickness was normal. Systolic function was vigorous. The estimated ejection fraction was in the range of 65% to 70%. Wall motion was normal; there were no regional wall motion abnormalities.- Pulmonary arteries: Systolic pressure was mildly increased. PA peak pressure: 45 mm Hg (S).  Bilateral lower extremity venous duplex:- No obvious evidence of deep vein or superficial thrombosis involving the right lower extremity and left lower extremity.Arctic sun in place, unable to visualize mid-distal femoral vein.- No evidence of Baker&'s cyst on the right or left.   Consultations:  PCCM  Discharge Exam: Filed Vitals:   08/06/14 1430  BP:   Pulse:   Temp: 98.4 F (36.9 C)  Resp:    Gen: No acute respiratory distress Chest: Clear to auscultation bilaterally without wheezes, rhonchi or crackles, room air-does have coarse wet sounding cough Cardiac: Regular rate and rhythm, S1-S2, no rubs murmurs or gallops, no peripheral edema, no JVD Abdomen: Soft nontender nondistended without obvious hepatosplenomegaly, no ascites Extremities: Symmetrical in  appearance without cyanosis, clubbing or effusion   Current Discharge Medication List    START taking these medications   Details  levofloxacin (LEVAQUIN) 500 MG tablet Take 1 tablet (500 mg total) by mouth daily. Qty: 5 tablet, Refills: 0      CONTINUE these medications which have NOT CHANGED   Details  ALPRAZolam (XANAX) 1 MG tablet Take 3 mg by mouth once.       No Known Allergies Follow-up Information   Follow up with [facility].   Contact information:   [address] [place] [address] [phone]     Microbiology: Recent Results (from the past 240 hour(s))  MRSA PCR SCREENING     Status: Abnormal   Collection Time    08/01/14  2:25 AM      Result Value Ref Range Status   MRSA by PCR POSITIVE (*) NEGATIVE Final   Comment:            The GeneXpert MRSA Assay (FDA     approved for NASAL specimens     only), is one component of a     comprehensive MRSA colonization     surveillance program. It is not     intended to diagnose MRSA     infection nor to guide or     monitor treatment for     MRSA infections.     RESULT CALLED TO, READ BACK BY AND VERIFIED WITH:     STOWE,S RN [phone] AT 0355 SKEEN,P  CULTURE, RESPIRATORY (NON-EXPECTORATED)  Status: None   Collection Time    08/03/14 12:07 PM      Result Value Ref Range Status   Specimen Description ENDOTRACHEAL   Final   Special Requests NONE   Final   Gram Stain     Final   Value: ABUNDANT WBC PRESENT,BOTH PMN AND MONONUCLEAR     RARE SQUAMOUS EPITHELIAL CELLS PRESENT     ABUNDANT GRAM NEGATIVE RODS     Performed at [facility]   Culture     Final   Value: ABUNDANT STREPTOCOCCUS PNEUMONIAE     MODERATE STAPHYLOCOCCUS AUREUS     Note: RIFAMPIN AND GENTAMICIN SHOULD NOT BE USED AS SINGLE DRUGS FOR TREATMENT OF STAPH INFECTIONS.     Performed at [facility]   Report Status PENDING   Incomplete   Organism ID, Bacteria STREPTOCOCCUS PNEUMONIAE    Final  CULTURE, BLOOD (ROUTINE X 2)     Status: None   Collection Time    08/03/14 12:16 PM      Result Value Ref Range Status   Specimen Description BLOOD RIGHT THUMB   Final   Special Requests BOTTLES DRAWN AEROBIC ONLY 2CC   Final   Culture  Setup Time     Final   Value: 08/03/2014 18:55     Performed at [facility]   Culture     Final   Value:        BLOOD CULTURE RECEIVED NO GROWTH TO DATE CULTURE WILL BE HELD FOR 5 DAYS BEFORE ISSUING A FINAL NEGATIVE REPORT     Performed at [facility]   Report Status PENDING   Incomplete  CULTURE, BLOOD (ROUTINE X 2)     Status: None   Collection Time    08/03/14  3:00 PM      Result Value Ref Range Status   Specimen Description BLOOD RIGHT HAND   Final   Special Requests BOTTLES DRAWN AEROBIC ONLY 2CC   Final   Culture  Setup Time     Final   Value: 08/03/2014 18:58     Performed at [facility]   Culture     Final   Value:        BLOOD CULTURE RECEIVED NO GROWTH TO DATE CULTURE WILL BE HELD FOR 5 DAYS BEFORE ISSUING A FINAL NEGATIVE REPORT     Performed at [facility]   Report Status PENDING   Incomplete  URINE CULTURE     Status: None   Collection Time    08/03/14  4:30 PM      Result Value Ref Range Status   Specimen Description URINE, CATHETERIZED   Final   Special Requests NONE   Final   Culture  Setup Time     Final   Value: 08/03/2014 18:13     Performed at [facility] Count     Final   Value: NO GROWTH     Performed at [facility]   Culture     Final   Value: NO GROWTH     Performed at [facility]   Report Status 08/04/2014 FINAL   Final     Labs: Basic Metabolic Panel:  Recent Labs Lab 08/02/14 2000 08/03/14 0600 08/04/14 0450 08/05/14 0500 08/06/14 0308  NA 138 140 140 140 141  K 4.5 4.4 4.4 3.1* 3.6*  CL 110 109 104 97 103  CO2 GLUCOSE 77 82  61* 81 94  BUN <3* <3* CREATININE 0.50 0.50 0.50 0.59 0.60  CALCIUM  7.3* 7.7* 8.3* 8.4 8.6  MG  --   --  1.6 1.9 2.0  PHOS  --   --  2.3 3.5 1.7*   Liver Function Tests:  Recent Labs Lab 07/31/14 2120 08/02/14 0400  AST 194* 78*  ALT 257* 183*  ALKPHOS 95  --   BILITOT 0.3  --   PROT 6.7  --   ALBUMIN 3.4*  --    CBC:  Recent Labs Lab 07/31/14 2120  08/02/14 0400 08/03/14 0600 08/04/14 0450 08/05/14 0500 08/06/14 0308  WBC 12.1*  --  10.8* 11.0* 12.3* 8.7 7.0  NEUTROABS 6.1  --   --  8.9*  --   --   --   HGB 13.0  < > 12.7 10.9* 11.6* 10.4* 11.1*  HCT 38.9  < > 36.6 32.5* 35.2* 30.9* 33.0*  MCV 86.8  --  84.9 87.6 87.1 86.3 86.8  PLT 278  --  239 199 227 216 203  < > = values in this interval not displayed.  Cardiac Enzymes:  Recent Labs Lab 08/01/14 1322 08/01/14 1824 08/02/14 0413  TROPONINI <0.30 <0.30 <0.30   CBG:  Recent Labs Lab 08/05/14 2048 08/06/14 0012 08/06/14 0424 08/06/14 0729 08/06/14 1129  GLUCAP 104* 151* 79 90 112*   Signed:  ELLIS,ALLISON L. ANP [facility] 08/06/2014, 2:54 PM  I have personally examined this patient and reviewed the entire database. I have reviewed the above note, made any necessary editorial changes, and agree with its content.  Lonia Blood, MD [facility]

## 2014-08-06 NOTE — Evaluation (Signed)
Reviewed and agree with assessment and POC.  Rehana Uncapher, PT DPT  [phone]  

## 2014-08-06 NOTE — Care Management Note (Signed)
    Page 1 of 2   08/06/2014     2:29:13 PM CARE MANAGEMENT NOTE 08/06/2014  Patient:  [name], [name]   Account Number:  [id number]  Date Initiated:  08/04/2014  Documentation initiated by:  DAVIS,RHONDA  Subjective/Objective Assessment:   patient admitted after intentional overdose and then cardiac arrest/intubated in the ed..     Action/Plan:   tbd by psych   Anticipated DC Date:  08/07/2014   Anticipated DC Plan:  HOME/SELF CARE  In-house referral  Clinical Social Worker      DC Planning Services  CM consult      Choice offered to / List presented to:  NA   DME arranged  NA      DME agency  NA     HH arranged  NA      HH agency  NA   Status of service:  Completed, signed off Medicare Important Message given?  NO (If response is "NO", the following Medicare IM given date fields will be blank) Date Medicare IM given:   Medicare IM given by:   Date Additional Medicare IM given:   Additional Medicare IM given by:    Discharge Disposition:  HOME/SELF CARE  Per UR Regulation:  Reviewed for med. necessity/level of care/duration of stay  If discussed at Long Length of Stay Meetings, dates discussed:    Comments:  08/06/14 1410- Donn Pierini RN, BSN [phone] Referral received for PCP needs- in to speak with pt and father  at bedside- pt showing that she has PCP- [name]- per pt this was her assigned OBGYN- pt states that she does not believe she has an assigned PCP doctor but she does not have her Medicaid card- [facility]- pt states that the [facility] clinic would be more convenient for her. Will call clinic for f/u appointment and give pt info on clinic address and phone #- explained to pt that if she does have an assigned PCP on her Medicaid card then will have to establish with that PCP or get it changed through her DSS case worker. Pt voiced understanding- father also at bedside and voiced  understanding.   [id number]/BJYNWG Davis,RN,BSN,CCM: Cruz.lines dcd [id number]/[facility] pccm note:22 y/o female with out of hospital cardiac arrest after drug overdose, ?suicide attempt vs accidental OD, extubated and doing well, will call psych to evaluate, sitter as ordered, suicide precautions.  Transfer to SDU and to [facility], PCCM will sign off.  [id number]/MWUXLK Davis,RN,BSN,CCM: patient admitted with od and cardiac arrest on 09122015/intubated and post resucitation care given. Required iv pressors until 09152015/extubated am of [id number]/Cruz[facility]lines for pressure monitoring intact.

## 2014-08-06 NOTE — Evaluation (Signed)
Physical Therapy Evaluation Patient Details Name: [name] MRN: [id number] DOB: [birth year] Today's Date: 08/06/2014   History of Present Illness  Pt is a 23 y.o. female admitted for cardiac arrest s/p OD. No pertinent PMH.   Clinical Impression  PTA, pt was independent with all functional mobility and ADLs. Pt currently requires min A for ambulation and transfers 2/2 instability and reduced coordination. Pt would benefit from skilled PT to address limitations noted below and progress towards baseline. D/c plan and assistance requirements discussed with pt and pt's father. Recommend 24hr assist/supervision initially with hand held A for stairs. Pt encouraged to be active with assitance for instability, but also "pay attention to her body" and rest as needed.      Follow Up Recommendations No PT follow up;Supervision/Assistance - 24 hour    Equipment Recommendations  None recommended by PT    Recommendations for Other Services       Precautions / Restrictions Precautions Precautions: Fall Precaution Comments: unsteadiness increases with fatigue Restrictions Weight Bearing Restrictions: No      Mobility  Bed Mobility Overal bed mobility: Independent             General bed mobility comments: Pt supine to sit EOB without A or cues  Transfers Overall transfer level: Needs assistance   Transfers: Sit to/from Stand Sit to Stand: Min assist         General transfer comment: Pt requires min A for unsteadiness with standing.   Ambulation/Gait Ambulation/Gait assistance: Min assist Ambulation Distance (Feet): 100 Feet (x2) Assistive device: None Gait Pattern/deviations: Decreased stride length;Narrow base of support (reduced trunk rotation and UE swing) Gait velocity: slow Gait velocity interpretation: Below normal speed for age/gender General Gait Details: pt requires min A for unsteadiness and occasional staggering during ambulation.staggering 2/2 narrow BOS.  Chair follow for fatiguePt demonstrates reduced coordination with elevation in HR. After amb ~100' pt HR up to 146bpm, pt rest break before continue activity. HR reduce to 110s within 1 minute of sitting. After second bout of amb, HR 130s bpm.   Stairs Stairs: Yes Stairs assistance: Min assist Stair Management: One rail Left Number of Stairs: 2 General stair comments: handrails used for stability. Recommend use of handheld A  initially when return home.   Wheelchair Mobility    Modified Rankin (Stroke Patients Only)       Balance Overall balance assessment: Needs assistance   Sitting balance-Leahy Scale: Good Sitting balance - Comments: Pt sit EOB without A    Standing balance support: During functional activity Standing balance-Leahy Scale: Good Standing balance comment: Pt requires A with initial standing 2/2 unsteadiness/sway.                              Pertinent Vitals/Pain Pain Assessment: Faces Faces Pain Scale: Hurts little more Pain Location: Chest with coughing  Pain Descriptors / Indicators: Grimacing;Discomfort Pain Intervention(s): Monitored during session    Home Living Family/patient expects to be discharged to:: Private residence Living Arrangements: Other relatives Available Help at Discharge: Family Type of Home: House Home Access: Stairs to enter Entrance Stairs-Rails: None Entrance Stairs-Number of Steps: 2 Home Layout: One level Home Equipment: None      Prior Function Level of Independence: Independent               Hand Dominance        Extremity/Trunk Assessment  Lower Extremity Assessment: Overall WFL for tasks assessed (Strength assessment in supine)      Cervical / Trunk Assessment: Normal  Communication   Communication: No difficulties  Cognition Arousal/Alertness: Awake/alert Behavior During Therapy: WFL for tasks assessed/performed Overall Cognitive Status: Within Functional Limits for  tasks assessed       Memory: Decreased short-term memory (amnesia regarding events surrounding hospitalization)              General Comments General comments (skin integrity, edema, etc.): D/c plan discussed with pt with pt's father present. Plan is to go home with 24hr supervision/assist initally. Explaination of current functional limiations and assistance required provided. Recommend pt be as active as possible and do as much for herself as tolerated. BP 118/87 in supine prior to activity; 118/80 post activty. Pt requires rest break 2/2 sinus tach with activity.      Exercises        Assessment/Plan    PT Assessment Patient needs continued PT services  PT Diagnosis Difficulty walking;Abnormality of gait   PT Problem List Decreased strength;Decreased activity tolerance;Decreased balance;Decreased coordination  PT Treatment Interventions Gait training;Stair training;Functional mobility training;Therapeutic exercise;Therapeutic activities;Balance training   PT Goals (Current goals can be found in the Care Plan section) Acute Rehab PT Goals Patient Stated Goal: go home PT Goal Formulation: With patient Time For Goal Achievement: 08/20/14 Potential to Achieve Goals: Good    Frequency Min 3X/week   Barriers to discharge        Co-evaluation               End of Session Equipment Utilized During Treatment: Gait belt Activity Tolerance: Patient tolerated treatment well Patient left: in chair;with call bell/phone within reach;with family/visitor present Nurse Communication: Mobility status (Vitals during activity)         Time: 1308-6578 PT Time Calculation (min): 26 min   Charges:         PT G Codes:          Barbette Mcglaun 08/06/2014, 11:19 AM Cathlyn Parsons, SPT

## 2014-08-06 NOTE — Clinical Social Work Psychosocial (Signed)
Clinical Social Work Department BRIEF PSYCHOSOCIAL ASSESSMENT 08/06/2014  Patient:  [name], [name]     Account Number:  [id number]     Admit date:  07/31/2014  Clinical Social Worker:  Marciano Sequin  Date/Time:  08/06/2014 03:13 PM  Referred by:  RN  Date Referred:  08/06/2014 Referred for  Substance Abuse   Other Referral:   Interview type:  Patient Other interview type:    PSYCHOSOCIAL DATA Living Status:  FAMILY Admitted from facility:   Level of care:   Primary support name:  Parents, [phone] Primary support relationship to patient:  PARENT Degree of support available:   Very strong support system    CURRENT CONCERNS  Other Concerns:    SOCIAL WORK ASSESSMENT / PLAN CSW received consult to meet with pt. CSW met pt at bedside. CSW introduce self and purpose of the visit. CSW use strength base approach to engage the pt. The pt presented with a tearful affect and sad mood. The patient expressed that she would like help. The patient expressed that she want to have a better life. CSW and pt processed difference between inpatient detox and intensive outpatient progam for Opioid Treatment. The pt expressed interest in intensive outpatient program. The pt called to set up an appointment. At this time there are no additonal needs idenified, CSW sign off.   Assessment/plan status:  Information/Referral to [employer] Other assessment/ plan:   Information/referral to community resources:   ADS contact information for IOP    PATIENT'S/FAMILY'S RESPONSE TO PLAN OF CARE: agreed    Greta Doom, MSW, [place]

## 2014-08-06 NOTE — Progress Notes (Signed)
Discharge education provided. Printed paper work given, pt verbalized understanding. All questions answered. Pt discharged via wheelchair by RN to car with family and belonging.

## 2014-08-07 LAB — CULTURE, RESPIRATORY

## 2014-08-07 LAB — CULTURE, RESPIRATORY W GRAM STAIN

## 2014-08-09 LAB — CULTURE, BLOOD (ROUTINE X 2)
Culture: NO GROWTH
Culture: NO GROWTH

## 2014-08-11 ENCOUNTER — Ambulatory Visit: Payer: Medicaid Other | Attending: Internal Medicine | Admitting: Internal Medicine

## 2014-08-11 ENCOUNTER — Encounter: Payer: Self-pay | Admitting: Internal Medicine

## 2014-08-11 VITALS — BP 112/77 | HR 103 | Temp 98.2°F | Resp 16 | Ht 65.0 in | Wt 143.0 lb

## 2014-08-11 DIAGNOSIS — F3289 Other specified depressive episodes: Secondary | ICD-10-CM | POA: Diagnosis not present

## 2014-08-11 DIAGNOSIS — B1920 Unspecified viral hepatitis C without hepatic coma: Secondary | ICD-10-CM | POA: Diagnosis not present

## 2014-08-11 DIAGNOSIS — Z23 Encounter for immunization: Secondary | ICD-10-CM

## 2014-08-11 DIAGNOSIS — F1910 Other psychoactive substance abuse, uncomplicated: Secondary | ICD-10-CM | POA: Diagnosis not present

## 2014-08-11 DIAGNOSIS — F329 Major depressive disorder, single episode, unspecified: Secondary | ICD-10-CM | POA: Insufficient documentation

## 2014-08-11 DIAGNOSIS — Z87891 Personal history of nicotine dependence: Secondary | ICD-10-CM | POA: Diagnosis not present

## 2014-08-11 DIAGNOSIS — Z09 Encounter for follow-up examination after completed treatment for conditions other than malignant neoplasm: Secondary | ICD-10-CM | POA: Insufficient documentation

## 2014-08-11 DIAGNOSIS — R4589 Other symptoms and signs involving emotional state: Secondary | ICD-10-CM

## 2014-08-11 LAB — COMPLETE METABOLIC PANEL WITH GFR
ALT: 1013 U/L — AB (ref 0–35)
AST: 470 U/L — ABNORMAL HIGH (ref 0–37)
Albumin: 4.1 g/dL (ref 3.5–5.2)
Alkaline Phosphatase: 125 U/L — ABNORMAL HIGH (ref 39–117)
BUN: 10 mg/dL (ref 6–23)
CALCIUM: 9.5 mg/dL (ref 8.4–10.5)
CHLORIDE: 100 meq/L (ref 96–112)
CO2: 21 meq/L (ref 19–32)
Creat: 0.6 mg/dL (ref 0.50–1.10)
GFR, Est Non African American: >89 mL/min
Glucose, Bld: 91 mg/dL (ref 70–99)
Potassium: 4.8 mEq/L (ref 3.5–5.3)
SODIUM: 137 meq/L (ref 135–145)
TOTAL PROTEIN: 8.3 g/dL (ref 6.0–8.3)
Total Bilirubin: 0.7 mg/dL (ref 0.2–1.2)

## 2014-08-11 LAB — LIPID PANEL
Cholesterol: 119 mg/dL (ref 0–200)
HDL: 35 mg/dL — ABNORMAL LOW (ref >39)
LDL CALC: 66 mg/dL (ref 0–99)
Total CHOL/HDL Ratio: 3.4 Ratio
Triglycerides: 90 mg/dL (ref <150)
VLDL: 18 mg/dL (ref 0–40)

## 2014-08-11 NOTE — Progress Notes (Signed)
LCSW met with patient about her history of substance abuse in order to provide resources.  Patient identified history of using marijuana, cocaine and heroine.  LCSW referred patient to [facility] for substance abuse treatment.  Patient also identified that she has been feeling depressed for the past few months.  LCSW encouraged patient to share this in the assessment process in order to support dual treatment as needed. LCSW encouraged patient to connect with her supports as she starts her treatment to encourage successful outcomes.  Christene Lye MSW, LCSW

## 2014-08-11 NOTE — Progress Notes (Signed)
Pt comes in to establish care s/p HFU- Overdose on Heroin [facility] 07/31/2014,intubated and admitted Pt states she feel a lot better. Slight chest discomfort from chest compressions States last time use of drugs when admitted Pt states she has information for Substance abuse Center LMP- 3 mnths ago s/p delivery. Irregular periods noted

## 2014-08-11 NOTE — Progress Notes (Signed)
Patient ID: [name], female   DOB: [birth year], 23 y.o.   MRN: [id number]   [id number]:[id number]  [id number]:[id number]  DOB - [birth year]  CC:  Chief Complaint  Patient presents with  . Hospitalization Follow-up    drug overdose       HPI: [name] is a 23 y.o. female here today to establish medical care.  Patient presents to clinic today as a hospital follow up of polysubstance drug overdose that resulted in cardiac arrest and respiratory distress leading to intubation.  Patient reports that she has been very depressed for the past three months since her boyfriend went to jail.  She states that she has been living with others since she does not have a job and has to take care of three children.  She states that she has only used heroin twice due to peer pressure.  She states that she has not used any heroin since hospital discharge but she has smoked some marijauna/alcohol since then.  She was referred to a substance abuse program but has not received a call back.  She denies tobacco use.  She was also found to have Hepatitis C during hospital admission.  Last pap smear was earlier this year and it was normal.  Patient has No headache, No chest pain, No abdominal pain - No Nausea, No new weakness tingling or numbness, No Cough - SOB.  No Known Allergies Past Medical History  Diagnosis Date  . No pertinent past medical history   . Family history of varicose veins   . FHx: hypertension   . FHx: thyroid disease   . FHx: scoliosis   . H/O varicella   . Hx: UTI (urinary tract infection) 2012  . Postpartum eclampsia 08/2006  . Bacterial infection   . Heart murmur    Current Outpatient Prescriptions on File Prior to Visit  Medication Sig Dispense Refill  . ALPRAZolam (XANAX) 1 MG tablet Take 3 mg by mouth once.      Marland Kitchen levofloxacin (LEVAQUIN) 500 MG tablet Take 1 tablet (500 mg total) by mouth daily.  5 tablet  0   No current facility-administered medications on file prior to visit.    Family History  Problem Relation Age of Onset  . Anemia Mother   . Depression Mother   . Hypertension Father   . Heart disease Maternal Grandmother   . Depression Maternal Grandmother   . Anesthesia problems Neg Hx   . Heart disease Paternal Grandfather    History   Social History  . Marital Status: Single    Spouse Name: N/A    Number of Children: N/A  . Years of Education: N/A   Occupational History  . Not on file.   Social History Main Topics  . Smoking status: Former Games developer  . Smokeless tobacco: Not on file  . Alcohol Use: Yes  . Drug Use: Yes    Special: Marijuana  . Sexual Activity: Yes    Birth Control/ Protection: Injection     Comment: Depo started 02/29/12   Other Topics Concern  . Not on file   Social History Narrative  . No narrative on file    Review of Systems: Constitutional: Negative for fever, chills, diaphoresis, activity change, appetite change and fatigue. HENT: Negative for ear pain, nosebleeds, congestion, facial swelling, rhinorrhea, neck pain, neck stiffness and ear discharge.  Eyes: Negative for pain, discharge, redness, itching and visual disturbance. Respiratory: Negative for cough, choking, chest tightness, shortness of breath, wheezing and stridor.  Cardiovascular: Negative for chest pain, palpitations and leg swelling. Gastrointestinal: Negative for abdominal distention. Genitourinary: Negative for dysuria, urgency, frequency, hematuria, flank pain, decreased urine volume, difficulty urinating and dyspareunia.  Musculoskeletal: Negative for back pain, joint swelling, arthralgia and gait problem. Neurological: Negative for dizziness, tremors, seizures, syncope, facial asymmetry, speech difficulty, weakness, light-headedness, numbness and headaches.  Hematological: Negative for adenopathy. Does not bruise/bleed easily. Psychiatric/Behavioral: Negative for hallucinations, behavioral problems, confusion, dysphoric mood, decreased  concentration and agitation.    Objective:   Filed Vitals:   08/11/14 0954  BP: 112/77  Pulse: 103  Temp: 98.2 F (36.8 C)  Resp: 16    Physical Exam: Constitutional: Patient appears well-developed and well-nourished. No distress. HENT: Normocephalic, atraumatic, External right and left ear normal. Oropharynx is clear and moist.  Eyes: Conjunctivae and EOM are normal. PERRLA, no scleral icterus. Neck: Normal ROM. Neck supple. No JVD. No tracheal deviation. No thyromegaly. CVS: RRR, S1/S2 +, no murmurs, no gallops, no carotid bruit.  Pulmonary: Effort and breath sounds normal, no stridor, rhonchi, wheezes, rales.  Abdominal: Soft. BS +, no distension, tenderness, rebound or guarding.  Musculoskeletal: Normal range of motion. No edema and no tenderness.  Lymphadenopathy: No lymphadenopathy noted, cervical Neuro: Alert. Normal reflexes, muscle tone coordination. No cranial nerve deficit. Skin: Skin is warm and dry. No rash noted. Not diaphoretic. No erythema. No pallor. Psychiatric: Normal mood and affect. Behavior, judgment, thought content normal.  Lab Results  Component Value Date   WBC 7.0 08/06/2014   HGB 11.1* 08/06/2014   HCT 33.0* 08/06/2014   MCV 86.8 08/06/2014   PLT 203 08/06/2014   Lab Results  Component Value Date   CREATININE 0.60 08/06/2014   BUN 6 08/06/2014   NA 141 08/06/2014   K 3.6* 08/06/2014   CL 103 08/06/2014   CO2 29 08/06/2014    Lab Results  Component Value Date   HGBA1C 5.1 08/01/2014   Lipid Panel  No results found for this basename: chol, trig, hdl, cholhdl, vldl, ldlcalc       Assessment and plan:   [name] was seen today for hospitalization follow-up.  Diagnoses and associated orders for this visit:  Polysubstance abuse Patient referred to [place], LCSW for additional resources to drug abuse programs.  Patient believes that she does not have a addiction problem because she has only used a few times.  It is in my opinion the patient has used  more than she reports.  Depressed mood Referred to counseling   Hepatitis C virus infection without hepatic coma, unspecified chronicity - Ambulatory referral to Infectious Disease---explained that she will not receive treatment if she is still using illicit drugs - COMPLETE METABOLIC PANEL WITH GFR - Lipid panel  Need for prophylactic vaccination and inoculation against influenza Received    Patient should follow up with counseling and substance abuse program asap.  She is to RTC if she has any complications.      Holland Commons, NP-C [facility] [phone] 08/17/2014, 11:12 PM

## 2014-08-17 ENCOUNTER — Encounter: Payer: Self-pay | Admitting: Internal Medicine

## 2014-08-23 ENCOUNTER — Other Ambulatory Visit: Payer: Medicaid Other

## 2014-08-30 ENCOUNTER — Other Ambulatory Visit: Payer: Medicaid Other

## 2014-08-30 DIAGNOSIS — B182 Chronic viral hepatitis C: Secondary | ICD-10-CM

## 2014-08-30 LAB — IRON: Iron: 182 ug/dL — ABNORMAL HIGH (ref 42–145)

## 2014-08-31 LAB — HEPATITIS B SURFACE ANTIBODY,QUALITATIVE: HEP B S AB: POSITIVE — AB

## 2014-08-31 LAB — HEPATITIS A ANTIBODY, TOTAL: Hep A Total Ab: NONREACTIVE

## 2014-08-31 LAB — ANA: Anti Nuclear Antibody(ANA): NEGATIVE

## 2014-08-31 LAB — HIV ANTIBODY (ROUTINE TESTING W REFLEX): HIV 1&2 Ab, 4th Generation: NONREACTIVE

## 2014-08-31 LAB — HEPATITIS C RNA QUANTITATIVE
HCV Quantitative Log: 6.78 {Log} — ABNORMAL HIGH (ref <1.18)
HCV Quantitative: 6021484 IU/mL — ABNORMAL HIGH (ref <15)

## 2014-08-31 LAB — HEPATITIS B CORE ANTIBODY, TOTAL: Hep B Core Total Ab: NONREACTIVE

## 2014-08-31 LAB — HEPATITIS B SURFACE ANTIGEN: HEP B S AG: NEGATIVE

## 2014-09-03 LAB — HEPATITIS C GENOTYPE

## 2014-09-20 ENCOUNTER — Encounter: Payer: Self-pay | Admitting: Internal Medicine

## 2014-10-05 ENCOUNTER — Encounter: Payer: Self-pay | Admitting: Internal Medicine

## 2014-10-05 ENCOUNTER — Ambulatory Visit (INDEPENDENT_AMBULATORY_CARE_PROVIDER_SITE_OTHER): Payer: Medicaid Other | Admitting: Internal Medicine

## 2014-10-05 ENCOUNTER — Other Ambulatory Visit: Payer: Self-pay | Admitting: Internal Medicine

## 2014-10-05 VITALS — BP 128/84 | HR 77 | Temp 97.9°F | Ht 65.0 in | Wt 143.0 lb

## 2014-10-05 DIAGNOSIS — Z23 Encounter for immunization: Secondary | ICD-10-CM

## 2014-10-05 DIAGNOSIS — B182 Chronic viral hepatitis C: Secondary | ICD-10-CM

## 2014-10-05 DIAGNOSIS — B1920 Unspecified viral hepatitis C without hepatic coma: Secondary | ICD-10-CM

## 2014-10-05 NOTE — Progress Notes (Signed)
+Jiayi L Renaldo HarrisonGoss is a 23 y.o. female who presents for initial evaluation and management of a positive Hepatitis C antibody test.  Patient tested positive recently due to history of IV drug use. Hepatitis C risk factors present are: IV drug abuse (details: last used in September, overdose ). Patient denies accidental needle stick, acupuncture, history of blood transfusion, intranasal drug use. Patient has had other studies performed. Results: hepatitis C RNA by PCR, result: positive. Patient has not had prior treatment for Hepatitis C. Patient does not have a past history of liver disease. Patient does not have a family history of liver disease.   HPI: She used heroin for the first (and only) time in September and was intubated.  No drug use since.  No use before and elevated LFTs c/w acute infection.    Patient does not have documented immunity to Hepatitis A. Patient does have documented immunity to Hepatitis B.     Review of Systems A comprehensive review of systems was negative.   Past Medical History  Diagnosis Date  . No pertinent past medical history   . Family history of varicose veins   . FHx: hypertension   . FHx: thyroid disease   . FHx: scoliosis   . H/O varicella   . Hx: UTI (urinary tract infection) 2012  . Postpartum eclampsia 08/2006  . Bacterial infection   . Heart murmur     Prior to Admission medications   Medication Sig Start Date End Date Taking? Authorizing Provider  ALPRAZolam Prudy Feeler(XANAX) 1 MG tablet Take 3 mg by mouth once.    Historical Provider, MD  levofloxacin (LEVAQUIN) 500 MG tablet Take 1 tablet (500 mg total) by mouth daily. 08/06/14   Russella DarAllison L Ellis, NP    No Known Allergies  History  Substance Use Topics  . Smoking status: Former Smoker    Quit date: 11/19/2009  . Smokeless tobacco: Not on file  . Alcohol Use: 0.0 oz/week    0 Not specified per week     Comment: occasional    Family History  Problem Relation Age of Onset  . Anemia Mother   .  Depression Mother   . Hypertension Father   . Heart disease Maternal Grandmother   . Depression Maternal Grandmother   . Anesthesia problems Neg Hx   . Heart disease Paternal Grandfather       Objective:   Filed Vitals:   10/05/14 0902  BP: 128/84  Pulse: 77  Temp: 97.9 F (36.6 C)   in no apparent distress and alert HEENT: anicteric Cor RRR and No murmurs clear Bowel sounds are normal, liver is not enlarged, spleen is not enlarged peripheral pulses normal, no pedal edema, no clubbing or cyanosis negative for - jaundice, spider hemangioma, telangiectasia, palmar erythema, ecchymosis and atrophy  Laboratory Genotype:  Lab Results  Component Value Date   HCVGENOTYPE 1a 08/30/2014   HCV viral load:  Lab Results  Component Value Date   HCVQUANT [id number]* 08/30/2014   Lab Results  Component Value Date   WBC 7.0 08/06/2014   HGB 11.1* 08/06/2014   HCT 33.0* 08/06/2014   MCV 86.8 08/06/2014   PLT 203 08/06/2014    Lab Results  Component Value Date   CREATININE 0.60 08/11/2014   BUN 10 08/11/2014   NA 137 08/11/2014   K 4.8 08/11/2014   CL 100 08/11/2014   CO2 21 08/11/2014    Lab Results  Component Value Date   ALT 1013* 08/11/2014   AST  470* 08/11/2014   ALKPHOS 125* 08/11/2014   BILITOT 0.7 08/11/2014   INR 1.10 08/01/2014      Assessment: Hepatitis C genotype 1a  Plan: 1) Patient counseled extensively on limiting acetaminophen to no more than 2 grams daily, avoidance of alcohol. 2) Transmission discussed with patient including sexual transmission, sharing razors and toothbrush.   3) Will need referral to gastroenterology if concern for cirrhosis 4) Will need referral for substance abuse counseling: Yes.   5) Will prescribe Harvoni for 12 weeks in 6-12 months if it remains active.  6) Hepatitis A vaccine Yes.  today and in return appt in 6 months.   7) Hepatitis B vaccine No. 8) Pneumovax vaccine if concern for cirrhosis 9) will follow up in 6  months.  20% of people clear the virus in the first year.  Will recheck the viral load in 6 months.

## 2014-10-05 NOTE — Addendum Note (Signed)
Addended by: Wendall MolaOCKERHAM, JACQUELINE A on: 10/05/2014 12:03 PM   Modules accepted: Orders

## 2014-10-12 ENCOUNTER — Ambulatory Visit: Payer: Self-pay

## 2014-10-13 ENCOUNTER — Ambulatory Visit (HOSPITAL_COMMUNITY): Payer: Medicaid Other

## 2014-10-19 ENCOUNTER — Emergency Department (HOSPITAL_BASED_OUTPATIENT_CLINIC_OR_DEPARTMENT_OTHER): Payer: Medicaid Other

## 2014-10-19 ENCOUNTER — Emergency Department (HOSPITAL_BASED_OUTPATIENT_CLINIC_OR_DEPARTMENT_OTHER)
Admission: EM | Admit: 2014-10-19 | Discharge: 2014-10-19 | Disposition: A | Discharge status: Home / Self Care | Payer: Medicaid Other | Attending: Emergency Medicine | Admitting: Emergency Medicine

## 2014-10-19 ENCOUNTER — Encounter (HOSPITAL_BASED_OUTPATIENT_CLINIC_OR_DEPARTMENT_OTHER): Payer: Self-pay | Admitting: *Deleted

## 2014-10-19 DIAGNOSIS — I10 Essential (primary) hypertension: Secondary | ICD-10-CM | POA: Insufficient documentation

## 2014-10-19 DIAGNOSIS — Z87891 Personal history of nicotine dependence: Secondary | ICD-10-CM | POA: Diagnosis not present

## 2014-10-19 DIAGNOSIS — Z79899 Other long term (current) drug therapy: Secondary | ICD-10-CM | POA: Insufficient documentation

## 2014-10-19 DIAGNOSIS — R05 Cough: Secondary | ICD-10-CM | POA: Diagnosis present

## 2014-10-19 DIAGNOSIS — Z8619 Personal history of other infectious and parasitic diseases: Secondary | ICD-10-CM | POA: Insufficient documentation

## 2014-10-19 DIAGNOSIS — R011 Cardiac murmur, unspecified: Secondary | ICD-10-CM | POA: Insufficient documentation

## 2014-10-19 DIAGNOSIS — Z87440 Personal history of urinary (tract) infections: Secondary | ICD-10-CM | POA: Insufficient documentation

## 2014-10-19 DIAGNOSIS — R059 Cough, unspecified: Secondary | ICD-10-CM

## 2014-10-19 MED ORDER — AZITHROMYCIN 250 MG PO TABS: ORAL_TABLET | ORAL | Status: DC

## 2014-10-19 MED ORDER — ALBUTEROL SULFATE HFA 108 (90 BASE) MCG/ACT IN AERS: 1.0000 | INHALATION_SPRAY | Freq: Four times a day (QID) | RESPIRATORY_TRACT | Status: DC | PRN

## 2014-10-19 NOTE — ED Notes (Signed)
Pt c/o cough and congestion x 3 days ?

## 2014-10-19 NOTE — ED Provider Notes (Signed)
CSN: [id number]     Arrival date & time 10/19/14  1620 History   First MD Initiated Contact with Patient 10/19/14 1632     Chief Complaint  Patient presents with  . Cough     (Consider location/radiation/quality/duration/timing/severity/associated sxs/prior Treatment) Patient is a 23 y.o. female presenting with cough.  Cough Cough characteristics:  Non-productive Severity:  Moderate Onset quality:  Gradual Duration:  3 days Timing:  Constant Progression:  Unchanged Chronicity:  Recurrent Smoker: yes   Context: upper respiratory infection   Relieved by:  Nothing Worsened by:  Nothing tried Ineffective treatments:  None tried Associated symptoms: rhinorrhea and sinus congestion   Associated symptoms: no chest pain, no headaches and no shortness of breath     Past Medical History  Diagnosis Date  . No pertinent past medical history   . Family history of varicose veins   . FHx: hypertension   . FHx: thyroid disease   . FHx: scoliosis   . H/O varicella   . Hx: UTI (urinary tract infection) 2012  . Postpartum eclampsia 08/2006  . Bacterial infection   . Heart murmur    Past Surgical History  Procedure Laterality Date  . No past surgeries     Family History  Problem Relation Age of Onset  . Anemia Mother   . Depression Mother   . Hypertension Father   . Heart disease Maternal Grandmother   . Depression Maternal Grandmother   . Anesthesia problems Neg Hx   . Heart disease Paternal Grandfather    History  Substance Use Topics  . Smoking status: Former Smoker    Quit date: 11/19/2009  . Smokeless tobacco: Not on file  . Alcohol Use: 0.0 oz/week    0 Not specified per week     Comment: occasional   OB History    Gravida Para Term Preterm AB TAB SAB Ectopic Multiple Living   3 3 3  0 0 0 0 0 0 3      Obstetric Comments   Hx of preeclampsia     Review of Systems  HENT: Positive for rhinorrhea.   Respiratory: Positive for cough. Negative for shortness of  breath.   Cardiovascular: Negative for chest pain.  Neurological: Negative for headaches.  All other systems reviewed and are negative.     Allergies  Review of patient's allergies indicates no known allergies.  Home Medications   Prior to Admission medications   Medication Sig Start Date End Date Taking? Authorizing Provider  albuterol (PROVENTIL HFA;VENTOLIN HFA) 108 (90 BASE) MCG/ACT inhaler Inhale 1-2 puffs into the lungs every 6 (six) hours as needed for wheezing or shortness of breath. 10/19/14   Mirian MoMatthew Gentry, MD  ALPRAZolam Prudy Feeler(XANAX) 1 MG tablet Take 3 mg by mouth once.    Historical Provider, MD  azithromycin (ZITHROMAX Z-PAK) 250 MG tablet 2 po day one, then 1 daily x 4 days 10/19/14   Mirian MoMatthew Gentry, MD   BP 96/60 mmHg  Pulse 101  Temp(Src) 98.2 F (36.8 C) (Oral)  Ht 5\' 5"  (1.651 m)  Wt 142 lb (64.411 kg)  BMI 23.63 kg/m2  SpO2 96%  LMP 09/25/2014 Physical Exam  Constitutional: She is oriented to person, place, and time. She appears well-developed and well-nourished.  HENT:  Head: Normocephalic and atraumatic.  Right Ear: External ear normal.  Left Ear: External ear normal.  Eyes: Conjunctivae and EOM are normal. Pupils are equal, round, and reactive to light.  Neck: Normal range of motion. Neck supple.  Cardiovascular:  Normal rate, regular rhythm, normal heart sounds and intact distal pulses.   Pulmonary/Chest: Effort normal and breath sounds normal.  Abdominal: Soft. Bowel sounds are normal. There is no tenderness.  Musculoskeletal: Normal range of motion.  Neurological: She is alert and oriented to person, place, and time.  Skin: Skin is warm and dry.  Vitals reviewed.   ED Course  Procedures (including critical care time) Labs Review Labs Reviewed - No data to display  Imaging Review Dg Chest 2 View  10/19/2014   CLINICAL DATA:  Cough, congestion for 3 days  EXAM: CHEST  2 VIEW  COMPARISON:  Portable chest x-ray of 08/05/2014  FINDINGS: The only  questionable abnormality is a vague opacity anteriorly on the lateral view. Some of this could represent fat pad, but a patchy pneumonia cannot be excluded. Otherwise the lungs are clear. Mediastinal and hilar contours are unremarkable. The heart is within normal limits in size. No bony abnormality is seen.  IMPRESSION: Cannot exclude patchy infiltrate anteriorly on the lateral view. No other abnormality. Consider followup   Electronically Signed   By: Dwyane DeePaul  Barry M.D.   On: 10/19/2014 16:44     EKG Interpretation None      MDM   Final diagnoses:  Cough    23 y.o. female with pertinent PMH of recent PNA presents with cough 3 days. Patient has fever. She has had runny nose, sinus congestion. She otherwise feels well.  He states that symptoms do not feel a prior pneumonia. On arrival today vitals signs and physical exam as above. X-ray demonstrated questionable patchy infiltrate. Givens patient's history and mild tachycardia, will give azithromycin and albuterol. Patient stable for discharge home with PCP follow-up in 3 days. She is well-appearing, take by mouth without difficulty, has no signs of bacterial pharyngitis. Discharged in stable condition.    1. Cough         Mirian MoMatthew Gentry, MD 10/19/14 [phone]

## 2014-10-19 NOTE — Discharge Instructions (Signed)
Cough, Adult  A cough is a reflex that helps clear your throat and airways. It can help heal the body or may be a reaction to an irritated airway. A cough may only last 2 or 3 weeks (acute) or may last more than 8 weeks (chronic).  CAUSES Acute cough:  Viral or bacterial infections. Chronic cough:  Infections.  Allergies.  Asthma.  Post-nasal drip.  Smoking.  Heartburn or acid reflux.  Some medicines.  Chronic lung problems (COPD).  Cancer. SYMPTOMS   Cough.  Fever.  Chest pain.  Increased breathing rate.  High-pitched whistling sound when breathing (wheezing).  Colored mucus that you cough up (sputum). TREATMENT   A bacterial cough may be treated with antibiotic medicine.  A viral cough must run its course and will not respond to antibiotics.  Your caregiver may recommend other treatments if you have a chronic cough. HOME CARE INSTRUCTIONS   Only take over-the-counter or prescription medicines for pain, discomfort, or fever as directed by your caregiver. Use cough suppressants only as directed by your caregiver.  Use a cold steam vaporizer or humidifier in your bedroom or home to help loosen secretions.  Sleep in a semi-upright position if your cough is worse at night.  Rest as needed.  Stop smoking if you smoke. SEEK IMMEDIATE MEDICAL CARE IF:   You have pus in your sputum.  Your cough starts to worsen.  You cannot control your cough with suppressants and are losing sleep.  You begin coughing up blood.  You have difficulty breathing.  You develop pain which is getting worse or is uncontrolled with medicine.  You have a fever. MAKE SURE YOU:   Understand these instructions.  Will watch your condition.  Will get help right away if you are not doing well or get worse. Document Released: 05/04/2011 Document Revised: 01/28/2012 Document Reviewed: 05/04/2011 ExitCare Patient Information 2015 [employer]. This information is not intended  to replace advice given to you by your health care provider. Make sure you discuss any questions you have with your health care provider.  

## 2014-11-30 ENCOUNTER — Ambulatory Visit: Payer: Medicaid Other

## 2014-12-15 ENCOUNTER — Ambulatory Visit: Payer: Self-pay

## 2015-04-05 ENCOUNTER — Ambulatory Visit: Payer: Self-pay | Admitting: Internal Medicine

## 2015-04-26 ENCOUNTER — Ambulatory Visit: Payer: Self-pay | Admitting: Internal Medicine

## 2015-05-02 ENCOUNTER — Ambulatory Visit: Payer: Self-pay

## 2015-06-02 ENCOUNTER — Ambulatory Visit: Payer: Self-pay | Admitting: Internal Medicine

## 2015-06-21 ENCOUNTER — Ambulatory Visit: Payer: Self-pay | Admitting: *Deleted

## 2016-05-15 ENCOUNTER — Telehealth: Payer: Self-pay | Admitting: Internal Medicine

## 2016-05-15 NOTE — Telephone Encounter (Signed)
Patient called to schedule a lab appointment because of her no show history I referred her to Dr. Burman Nievesomer/ Jackie

## 2016-07-02 ENCOUNTER — Other Ambulatory Visit: Payer: Medicaid Other

## 2016-07-02 DIAGNOSIS — B1920 Unspecified viral hepatitis C without hepatic coma: Secondary | ICD-10-CM

## 2016-07-04 LAB — HEPATITIS C RNA QUANTITATIVE
HCV Quantitative Log: 6.11 {Log} — ABNORMAL HIGH (ref <1.18)
HCV Quantitative: 1286004 IU/mL — ABNORMAL HIGH (ref <15)

## 2016-07-16 ENCOUNTER — Ambulatory Visit (INDEPENDENT_AMBULATORY_CARE_PROVIDER_SITE_OTHER): Payer: Medicaid Other | Admitting: Internal Medicine

## 2016-07-16 ENCOUNTER — Encounter: Payer: Self-pay | Admitting: Internal Medicine

## 2016-07-16 DIAGNOSIS — T40601D Poisoning by unspecified narcotics, accidental (unintentional), subsequent encounter: Secondary | ICD-10-CM | POA: Diagnosis not present

## 2016-07-16 DIAGNOSIS — B182 Chronic viral hepatitis C: Secondary | ICD-10-CM | POA: Diagnosis not present

## 2016-07-16 DIAGNOSIS — Z3490 Encounter for supervision of normal pregnancy, unspecified, unspecified trimester: Secondary | ICD-10-CM

## 2016-07-16 DIAGNOSIS — Z331 Pregnant state, incidental: Secondary | ICD-10-CM

## 2016-07-16 NOTE — Progress Notes (Signed)
   Subjective:    Patient ID: [name], female    DOB: 11/14/1991, 25 y.o.   MRN: [id number]  HPI Here for follow up of hepatitis C.  I saw her about 2 years ago for new acute hepatitis C following a brief period of IV drug use and overdose.  [name] was then to return for a repeat viral load to see if it persisted or cleared.  [name] no-showed multiple appointments and now comes back after a repeat viral load confirmed active chronic hepatitis C.  [name] though also is about [redacted] weeks pregnant.  Remains drug free.  Engaged to father of baby.    Review of Systems  Constitutional: Negative for fatigue.  Skin: Negative for rash.  Neurological: Negative for headaches.       Objective:   Physical Exam  Constitutional: [name] appears well-developed and well-nourished.  Eyes: No scleral icterus.  Cardiovascular: Normal rate, regular rhythm and normal heart sounds.   Skin: No rash noted.          Assessment & Plan:

## 2016-07-17 NOTE — Assessment & Plan Note (Signed)
Remains drug free 

## 2016-07-17 NOTE — Assessment & Plan Note (Signed)
Currently pregnant and will defer treatment as above

## 2016-07-17 NOTE — Assessment & Plan Note (Signed)
Currently pregnant so not able to give medication (not studied during pregnancy).  Will return after delivery and once settled with newborn and ready for treatment.

## 2016-07-31 ENCOUNTER — Encounter: Payer: Self-pay | Admitting: Obstetrics

## 2016-07-31 ENCOUNTER — Ambulatory Visit (INDEPENDENT_AMBULATORY_CARE_PROVIDER_SITE_OTHER): Payer: Medicaid Other | Admitting: Obstetrics

## 2016-07-31 VITALS — BP 115/77 | HR 80 | Wt 144.5 lb

## 2016-07-31 DIAGNOSIS — Z1389 Encounter for screening for other disorder: Secondary | ICD-10-CM

## 2016-07-31 DIAGNOSIS — O0991 Supervision of high risk pregnancy, unspecified, first trimester: Secondary | ICD-10-CM

## 2016-07-31 DIAGNOSIS — Z3201 Encounter for pregnancy test, result positive: Secondary | ICD-10-CM

## 2016-07-31 DIAGNOSIS — Z331 Pregnant state, incidental: Secondary | ICD-10-CM

## 2016-07-31 DIAGNOSIS — Z3491 Encounter for supervision of normal pregnancy, unspecified, first trimester: Secondary | ICD-10-CM

## 2016-07-31 DIAGNOSIS — F17200 Nicotine dependence, unspecified, uncomplicated: Secondary | ICD-10-CM

## 2016-07-31 DIAGNOSIS — B1920 Unspecified viral hepatitis C without hepatic coma: Secondary | ICD-10-CM

## 2016-07-31 LAB — POCT URINE PREGNANCY: PREG TEST UR: POSITIVE — AB

## 2016-07-31 LAB — OB RESULTS CONSOLE GBS: STREP GROUP B AG: POSITIVE

## 2016-07-31 MED ORDER — OB COMPLETE PETITE 35-5-1-200 MG PO CAPS: 1.0000 | ORAL_CAPSULE | Freq: Every day | ORAL | 3 refills | Status: DC

## 2016-07-31 NOTE — Progress Notes (Addendum)
Patient ID: [name], female   DOB: Oct 27, 1991, 25 y.o.   MRN: [id number] Subjective:    [name] is being seen today for her first obstetrical visit.  This is not a planned pregnancy. She is at [redacted]w[redacted]d gestation. Her obstetrical history is significant for smoker and history of opiate abuse. Relationship with FOB: significant other, living together. Patient does not intend to breast feed. Pregnancy history fully reviewed.  The information documented in the HPI was reviewed and verified.  Menstrual History: OB History    Gravida Para Term Preterm AB Living   5 3 3  0 1 3   SAB TAB Ectopic Multiple Live Births   0 1 0 0 3      Obstetric Comments   Hx of preeclampsia       Patient's last menstrual period was 05/22/2016 (approximate).    Past Medical History:  Diagnosis Date  . Bacterial infection   . Family history of varicose veins   . FHx: hypertension   . FHx: scoliosis   . FHx: thyroid disease   . H/O varicella   . Heart murmur   . Hepatitis    hepatitis C- 2015, confirmed this year  . Hx: UTI (urinary tract infection) 2012  . No pertinent past medical history   . Postpartum eclampsia 08/2006    Past Surgical History:  Procedure Laterality Date  . NO PAST SURGERIES       (Not in a hospital admission) No Known Allergies  Social History  Substance Use Topics  . Smoking status: Current Every Day Smoker    Packs/day: 0.25    Types: Cigarettes  . Smokeless tobacco: Never Used     Comment: up to half pack/day  . Alcohol use 0.0 oz/week     Comment: occasional    Family History  Problem Relation Age of Onset  . Anemia Mother   . Depression Mother   . Hypertension Father   . Heart disease Maternal Grandmother   . Depression Maternal Grandmother   . Heart disease Paternal Grandfather   . Anesthesia problems Neg Hx      Review of Systems Constitutional: negative for weight loss Gastrointestinal: negative for vomiting Genitourinary:negative for genital  lesions and vaginal discharge and dysuria Musculoskeletal:negative for back pain Behavioral/Psych: negative for abusive relationship, depression, illegal drug usage and tobacco use    Objective:    BP 115/77   Pulse 80   Wt 144 lb 8 oz (65.5 kg)   LMP 05/22/2016 (Approximate)   BMI 23.68 kg/m  General Appearance:    Alert, cooperative, no distress, appears stated age  Head:    Normocephalic, without obvious abnormality, atraumatic  Eyes:    PERRL, conjunctiva/corneas clear, EOM's intact, fundi    benign, both eyes  Ears:    Normal TM's and external ear canals, both ears  Nose:   Nares normal, septum midline, mucosa normal, no drainage    or sinus tenderness  Throat:   Lips, mucosa, and tongue normal; teeth and gums normal  Neck:   Supple, symmetrical, trachea midline, no adenopathy;    thyroid:  no enlargement/tenderness/nodules; no carotid   bruit or JVD  Back:     Symmetric, no curvature, ROM normal, no CVA tenderness  Lungs:     Clear to auscultation bilaterally, respirations unlabored  Chest Wall:    No tenderness or deformity   Heart:    Regular rate and rhythm, S1 and S2 normal, no murmur, rub   or  gallop  Breast Exam:    No tenderness, masses, or nipple abnormality  Abdomen:     Soft, non-tender, bowel sounds active all four quadrants,    no masses, no organomegaly  Genitalia:    Normal female without lesion, discharge or tenderness  Extremities:   Extremities normal, atraumatic, no cyanosis or edema  Pulses:   2+ and symmetric all extremities  Skin:   Skin color, texture, turgor normal, no rashes or lesions  Lymph nodes:   Cervical, supraclavicular, and axillary nodes normal  Neurologic:   CNII-XII intact, normal strength, sensation and reflexes    throughout      Lab Review Urine pregnancy test Labs reviewed yes Radiologic studies reviewed no  Assessment:    Pregnancy at [redacted]w[redacted]d weeks    Positive for Hepatitis C  History of opiate abuse, on Subutex  Tobacco  dependence    Plan:    Referred to MFM Infectious Disease managing Hep C Smoking Cessation encouraged Prenatal vitamins.  Counseling provided regarding continued use of seat belts, cessation of alcohol consumption, smoking or use of illicit drugs; infection precautions i.e., influenza/TDAP immunizations, toxoplasmosis,CMV, parvovirus, listeria and varicella; workplace safety, exercise during pregnancy; routine dental care, safe medications, sexual activity, hot tubs, saunas, pools, travel, caffeine use, fish and methlymercury, potential toxins, hair treatments, varicose veins Weight gain recommendations per IOM guidelines reviewed: underweight/BMI< 18.5--> gain 28 - 40 lbs; normal weight/BMI 18.5 - 24.9--> gain 25 - 35 lbs; overweight/BMI 25 - 29.9--> gain 15 - 25 lbs; obese/BMI >30->gain  11 - 20 lbs Problem list reviewed and updated. FIRST/CF mutation testing/NIPT/QUAD SCREEN/fragile X/Ashkenazi Jewish population testing/Spinal muscular atrophy discussed: requested. Role of ultrasound in pregnancy discussed; fetal survey: requested. Amniocentesis discussed: not indicated. VBAC calculator score: VBAC consent form provided  Meds ordered this encounter  Medications  . Prenat-FeCbn-FeAspGl-FA-Omega (OB COMPLETE PETITE) 35-5-1-200 MG CAPS    Sig: Take 1 capsule by mouth daily before breakfast.    Dispense:  90 capsule    Refill:  3   Orders Placed This Encounter  Procedures  . Culture, OB Urine  . [place] MFM Fetal Nuchal Translucency    Standing Status:   Future    Standing Expiration Date:   09/30/2017    Order Specific Question:   Reason for Exam (SYMPTOM  OR DIAGNOSIS REQUIRED)    Answer:   hx of hep c, subutex    Order Specific Question:   Preferred imaging location?    Answer:   [facility]  . Prenatal Profile I  . HIV antibody  . Hemoglobinopathy evaluation  . Varicella zoster antibody, IgG  . VITAMIN D 25 Hydroxy (Vit-D Deficiency, Fractures)  . MaterniT21  plus  Core+ESS+SCA, Blood    Order Specific Question:   Is the patient insulin dependent?    Answer:   No    Order Specific Question:   Please enter gestational age. This should be expressed as weeks AND days, i.e. 16w 6d. Enter weeks here. Enter days in next question.    Answer:   68    Order Specific Question:   Please enter gestational age. This should be expressed as weeks AND days, i.e. 16w 6d. Enter days here. Enter weeks in previous question.    Answer:   0    Order Specific Question:   How was gestational age calculated?    Answer:   LMP    Order Specific Question:   Please give the date of LMP OR Ultrasound OR Estimated date of delivery.  Answer:   02/26/2017    Order Specific Question:   Number of Fetuses (Type of Pregnancy):    Answer:   1    Order Specific Question:   Indications for performing the test? (please choose all that apply):    Answer:   Routine screening    Order Specific Question:   Other Indications? (Y=Yes, N=No)    Answer:   Y    Comments:   hep C    Order Specific Question:   If this is a repeat specimen, please indicate the reason:    Answer:   Not indicated    Order Specific Question:   Please specify the patient's race: (C=White/Caucasion, B=Black, I=Native American, A=Asian, H=Hispanic, O=Other, U=Unknown)    Answer:   C    Order Specific Question:   Donor Egg - indicate if the egg was obtained from in vitro fertilization.    Answer:   N    Order Specific Question:   Age of Egg Donor.    Answer:   [age]    Order Specific Question:   Prior Down Syndrome/ONTD screening during current pregnancy.    Answer:   N    Order Specific Question:   Prior First Trimester Testing    Answer:   N    Order Specific Question:   Prior Second Trimester Testing    Answer:   N    Order Specific Question:   Family History of Neural Tube Defects    Answer:   N    Order Specific Question:   Prior Pregnancy with Down Syndrome    Answer:   N    Order Specific Question:   Please give  the patient's weight (in pounds)    Answer:   144  . AMB referral to maternal fetal medicine    Referral Priority:   Routine    Referral Type:   Consultation    Referral Reason:   Specialty Services Required    Number of Visits Requested:   1  . POCT urine pregnancy    Follow up in 2 weeks. 50% of 45 min visit spent on counseling and coordination of care.

## 2016-08-03 LAB — PAP IG W/ RFLX HPV ASCU: PAP SMEAR COMMENT: 0

## 2016-08-04 ENCOUNTER — Other Ambulatory Visit: Payer: Self-pay | Admitting: Obstetrics

## 2016-08-04 DIAGNOSIS — N390 Urinary tract infection, site not specified: Secondary | ICD-10-CM

## 2016-08-04 LAB — URINE CULTURE, OB REFLEX

## 2016-08-04 LAB — CULTURE, OB URINE

## 2016-08-04 MED ORDER — AMOXICILLIN-POT CLAVULANATE 875-125 MG PO TABS: 1.0000 | ORAL_TABLET | Freq: Two times a day (BID) | ORAL | 0 refills | Status: DC

## 2016-08-10 LAB — PRENATAL PROFILE I(LABCORP)
Antibody Screen: NEGATIVE
BASOS ABS: 0 10*3/uL (ref 0.0–0.2)
Basos: 0 %
EOS (ABSOLUTE): 1.1 10*3/uL — ABNORMAL HIGH (ref 0.0–0.4)
EOS: 8 %
Hematocrit: 36.8 % (ref 34.0–46.6)
Hemoglobin: 12.3 g/dL (ref 11.1–15.9)
Hepatitis B Surface Ag: NEGATIVE
IMMATURE GRANS (ABS): 0 10*3/uL (ref 0.0–0.1)
Immature Granulocytes: 0 %
LYMPHS: 23 %
Lymphocytes Absolute: 3.1 10*3/uL (ref 0.7–3.1)
MCH: 30.9 pg (ref 26.6–33.0)
MCHC: 33.4 g/dL (ref 31.5–35.7)
MCV: 93 fL (ref 79–97)
Monocytes Absolute: 0.9 10*3/uL (ref 0.1–0.9)
Monocytes: 7 %
NEUTROS PCT: 62 %
Neutrophils Absolute: 8.2 10*3/uL — ABNORMAL HIGH (ref 1.4–7.0)
PLATELETS: 257 10*3/uL (ref 150–379)
RBC: 3.98 x10E6/uL (ref 3.77–5.28)
RDW: 11.8 % — ABNORMAL LOW (ref 12.3–15.4)
RH TYPE: POSITIVE
RPR Ser Ql: NONREACTIVE
Rubella Antibodies, IGG: 6.65 index (ref >0.99)
WBC: 13.3 10*3/uL — ABNORMAL HIGH (ref 3.4–10.8)

## 2016-08-10 LAB — MATERNIT21  PLUS CORE+ESS+SCA, BLOOD
Chromosome 13: NEGATIVE
Chromosome 18: NEGATIVE
Chromosome 21: NEGATIVE
PDF: 0
Y Chromosome: NOT DETECTED

## 2016-08-10 LAB — VITAMIN D 25 HYDROXY (VIT D DEFICIENCY, FRACTURES): VIT D 25 HYDROXY: 39.5 ng/mL (ref 30.0–100.0)

## 2016-08-10 LAB — HEMOGLOBINOPATHY EVALUATION
HGB C: 0 %
HGB S: 0 %
Hemoglobin A2 Quantitation: 2.5 % (ref 0.7–3.1)
Hemoglobin F Quantitation: 0 % (ref 0.0–2.0)
Hgb A: 97.5 % (ref 94.0–98.0)

## 2016-08-10 LAB — VARICELLA ZOSTER ANTIBODY, IGG: Varicella zoster IgG: 767 index (ref >165)

## 2016-08-10 LAB — HIV ANTIBODY (ROUTINE TESTING W REFLEX): HIV SCREEN 4TH GENERATION: NONREACTIVE

## 2016-08-11 LAB — NUSWAB VG+, CANDIDA 6SP
Atopobium vaginae: HIGH Score — AB
CANDIDA KRUSEI, NAA: NEGATIVE
CHLAMYDIA TRACHOMATIS, NAA: NEGATIVE
Candida albicans, NAA: NEGATIVE
Candida glabrata, NAA: POSITIVE — AB
Candida lusitaniae, NAA: NEGATIVE
Candida parapsilosis, NAA: NEGATIVE
Candida tropicalis, NAA: POSITIVE — AB
Megasphaera 1: HIGH Score — AB
NEISSERIA GONORRHOEAE, NAA: NEGATIVE
TRICH VAG BY NAA: NEGATIVE

## 2016-08-13 ENCOUNTER — Other Ambulatory Visit: Payer: Self-pay | Admitting: Obstetrics

## 2016-08-13 DIAGNOSIS — B9689 Other specified bacterial agents as the cause of diseases classified elsewhere: Secondary | ICD-10-CM

## 2016-08-13 DIAGNOSIS — N760 Acute vaginitis: Principal | ICD-10-CM

## 2016-08-13 DIAGNOSIS — B3731 Acute candidiasis of vulva and vagina: Secondary | ICD-10-CM

## 2016-08-13 DIAGNOSIS — B373 Candidiasis of vulva and vagina: Secondary | ICD-10-CM

## 2016-08-13 MED ORDER — METRONIDAZOLE 500 MG PO TABS: 500.0000 mg | ORAL_TABLET | Freq: Two times a day (BID) | ORAL | 2 refills | Status: DC

## 2016-08-13 MED ORDER — TERCONAZOLE 0.8 % VA CREA: 1.0000 | TOPICAL_CREAM | Freq: Every day | VAGINAL | 0 refills | Status: DC

## 2016-08-15 ENCOUNTER — Encounter: Payer: Medicaid Other | Admitting: Obstetrics

## 2016-08-15 ENCOUNTER — Telehealth: Payer: Self-pay | Admitting: *Deleted

## 2016-08-15 ENCOUNTER — Encounter: Payer: Self-pay | Admitting: *Deleted

## 2016-08-15 NOTE — Telephone Encounter (Signed)
Was about to send letter to patient about her lab results after trying by phone then I saw she has an appointment today.

## 2016-08-16 ENCOUNTER — Telehealth: Payer: Self-pay | Admitting: *Deleted

## 2016-08-16 NOTE — Telephone Encounter (Signed)
Patient made aware of lab results and medication sent to pharmacy.Reminded patient that she missed her appointment.She stated she has rescheduled.

## 2016-08-23 ENCOUNTER — Encounter (HOSPITAL_COMMUNITY): Payer: Self-pay | Admitting: Obstetrics

## 2016-08-29 ENCOUNTER — Ambulatory Visit (HOSPITAL_COMMUNITY): Payer: Medicaid Other

## 2016-08-31 ENCOUNTER — Ambulatory Visit (HOSPITAL_COMMUNITY)
Admission: RE | Admit: 2016-08-31 | Discharge: 2016-08-31 | Disposition: A | Discharge status: Home / Self Care | Payer: Medicaid Other | Source: Ambulatory Visit | Attending: Obstetrics | Admitting: Obstetrics

## 2016-08-31 ENCOUNTER — Ambulatory Visit (HOSPITAL_COMMUNITY): Admission: RE | Admit: 2016-08-31 | Payer: Medicaid Other | Source: Ambulatory Visit

## 2016-08-31 ENCOUNTER — Encounter (HOSPITAL_COMMUNITY): Payer: Self-pay

## 2016-08-31 DIAGNOSIS — O98411 Viral hepatitis complicating pregnancy, first trimester: Secondary | ICD-10-CM | POA: Insufficient documentation

## 2016-08-31 DIAGNOSIS — Z3682 Encounter for antenatal screening for nuchal translucency: Secondary | ICD-10-CM | POA: Insufficient documentation

## 2016-08-31 DIAGNOSIS — O99331 Smoking (tobacco) complicating pregnancy, first trimester: Secondary | ICD-10-CM | POA: Diagnosis not present

## 2016-08-31 DIAGNOSIS — Z3A12 12 weeks gestation of pregnancy: Secondary | ICD-10-CM | POA: Insufficient documentation

## 2016-08-31 DIAGNOSIS — B1920 Unspecified viral hepatitis C without hepatic coma: Secondary | ICD-10-CM | POA: Insufficient documentation

## 2016-08-31 DIAGNOSIS — O283 Abnormal ultrasonic finding on antenatal screening of mother: Secondary | ICD-10-CM | POA: Insufficient documentation

## 2016-08-31 DIAGNOSIS — Z315 Encounter for genetic counseling: Secondary | ICD-10-CM | POA: Insufficient documentation

## 2016-08-31 DIAGNOSIS — O0991 Supervision of high risk pregnancy, unspecified, first trimester: Secondary | ICD-10-CM

## 2016-08-31 DIAGNOSIS — O0990 Supervision of high risk pregnancy, unspecified, unspecified trimester: Secondary | ICD-10-CM

## 2016-08-31 NOTE — Progress Notes (Signed)
Maternal Fetal Medicine Consultation  Requesting Provider(s): Dr. Clearance Coots  Reason for consultation: Hepatitis C infection, subutex use in pregnancy  HPI: [name] is a 25 yo G4P3003 at [redacted]w[redacted]d by [place] today who presents for consultation. She reports feeling well, some mild nausea and vomiting earlier in the pregnancy but she is able to tolerate a normal diet. Her medical issues are as follows:  1. Hepatitis C infection: [name] was diagnosed with this in 2015 in the setting of IV drug use. She is followed by the [facility] and was planning for treatment when her pregnancy was diagnosed. Her most recent HCV quantitative viral load was 1.2 million in August of this year. The last LFTs recorded in our system were 07/2014 showing AST 470 and ALT 1,013. A recent HIV testing in September was negative.   2. History of polysubstance abuse: [name] has a history of IV drug use as well as THC use. She discontinued all illicit drug use in April of this year and has done well on subutex since that time. She is currently on a dose of 2mg  daily and reports no symptoms of withdrawal on this dose. Her subutex is prescribed by Dr. Loraine Leriche of [facility] and he is aware of her pregnancy and willing to continue to prescribe her subutex throughout pregnancy.  3. History of eclampsia: [name] reports the onset of headaches followed by two seizures witnessed by her mother at [redacted] weeks gestation in her first pregnancy in 2007. She underwent induction of labor and had an otherwise uncomplicated vaginal delivery. Her subsequent two pregnancies were not complicated by hypertensive disease and she denies any history of hypertension outside of pregnancy. Her most recent pregnancy was 2.5 years ago. Blood pressure is normal today at 103/55.  OB History: OB History    Gravida Para Term Preterm AB Living   5 3 3  0 1 3   SAB TAB Ectopic Multiple Live Births   0 1 0 0 3      Obstetric Comments   Hx of preeclampsia     Taia denies a history of SAB today   PMH:  History of heart murmur in childhood PSH: None Meds: Subutex, prenatal vitamins  Allergies: NKDA Fh: Denies history of birth defects, stillbirth, recurrent miscarriage, learning disabilitues Soc: Former smoker, quit two weeks ago, history of PSA detailed above, denies current alcohol use  Review of Systems: no vaginal bleeding or cramping/contractions, no LOF, no nausea/vomiting. All other systems reviewed and are negative.  Prior to our visit an obstetric ultrasound was performed which was notable for an increased nuchal translucency of 3.43mm. Please see separate document for full details of the fetal ultrasound report.  A/P: [name] is a 25 yo G4P3 at [redacted]w[redacted]d by ultrasound today who presents for consultation for a history of HCV and subutex use. Ultrasound today showed an increased nuchal translucency measuring 3.78mm. I discussed the following with [name] today:  1. Hepatitis C virus: We discussed the diagnosis of Hepatitis C and its implications for [name]'s pregnancy. We reviewed the principal risk factors for HCV transmission including IV drug use and blood transfusion. Although Hepatitis C is the leading cause of chronic liver failure, 75% of infections are asymptomatic taking 10-20 yrs to develop symptoms and at least 50% of infected individuals progress to chronic infection. Long term complications include cirrhosis and hepatocellular carcinoma. Vertical HCV transmission rates of 2-8% have been demonstrated, with maternal viremia (detectable presence of HCV RNA in blood) an almost uniform prerequisite for  transmission. In pregnancies among HCV-infected mothers who were HCV RNA negative, vertical transmission was rare. Currently, no preventive measures are available to lower the risk of vertical HCV infection of neonates. Treatment during pregnancy is supportive.   I recommend obtaining liver function tests (AST, ALT) at [name]'s next prenatal  visit, and further consultation with Hepatology if these values are abnormal. We recommend repeat HIV screening in the third trimester.The route of delivery has not been shown to influence the risk of vertical HCV transmission. Cesarean delivery should be reserved for routine obstetric indications. Internal fetal monitors and operative delivery should generally be avoided as they may increase the risk of vertical transmission. Breastfeeding has not been associated with an increased risk of neonatal HCV infection and, therefore, is not contraindicated in HCV-infected mothers. [name] has already established care with an ID practice and I encouraged her to follow-up with them in the postpartum period to readdress potential treatment.   2. Subutex use: We discussed that buprenorphine (subutex) is a partial opioid agonist approved for treatment of opioid addiction. A large randomized trial Yetta Barre et al, 2010 NEJM) comparing methadone and buprenorphine for treatment of maternal opioid dependency found that buprenorphine was associated with significantly lower doses of morphine for treatment of neonatal abstinence syndrome (mean total amount 1.1 versus 10.4 mg), shorter duration of treatment for neonatal abstinence syndrome (4.1 versus 9.9 days), and shorter neonatal hospital stay (10.0 versus 17.5 days). [name] understands that the pediatrician team should be made aware of her subutex use and that her baby will likely warrant prolonged monitoring for neonatal abstinence syndrome after delivery. She should expect to be discharged home following delivery while her baby is still monitored in the hospital for several more days.  I recommend that [name] continue subutex during the pregnancy and that attempts at weaning are deferred until after delivery. The goal of maintenance therapy is administration of subutex in doses sufficient to prevent symptoms of withdrawal and reduce or eliminate drug craving. Detoxification,  on the other hand, aims to lower the maternal maintenece dose to achieve the lowest possible dose that prevents withdrawal symptoms and ultimately to eliminate maintenance drug use. The safety of detoxification during pregnancy is not well studied. The best evidence suggests that there is no increased risk of harm to the fetus although miscarriages, preterm births, meconium passage, stillbirths, and elevated amniotic fluid epinephrine and norepinephrine levels have been reported in case reports. The major reason detoxification is not recommended is that about 50 percent of women return to illicit opioid use  Maintenance therapy and detoxification have never been compared in a randomized trial involving pregnant women. Maintenance therapy is considered the standard of care for pregnant women dependent on opioids.   3. Increased Nuchal translucency: We reviewed that an increased nuchal translucency can be associated with aneuploidy, particularly trisomy 21 or Turner syndrome, or can be a marker for congenital heart disease or other malformation. Fortunately, given the normal results of Anntoinette's cell-free fetal DNA screening, the residual risk for aneuploidy is 2.5%.  When observed in the first trimester, we reviewed that increased nuchal translucency commonly resolves in utero, with approximately 70% of fetuses with an increased NT of 3.5 mm having normal pediatric outcomes, particularly if no other abnormalities arise in a full evaluation.  An increased nuchal translucency can less frequently be associated with genetic syndromes, such as Noonan syndrome. In the absence of aneuploidy or structural anomalies, the size of the first-trimester cystic hygroma appears to be the most important determinant  of outcome. The presence of hydrops can increase the risk of poor outcome to up to 80%. There is no evidence of hydrops on today's ultrasound.  Given these possibilities, we reviewed additional diagnostic testing  options, inclusive of chorionic villus sampling and amniocentesis for both chromosomal and microarray analyses. Hinda KehrMaranda was seen in genetic counseling immediately following our visit and declined further testing.  We also reviewed recommendations for both a fetal echocardiogram as well as an extended anatomic survey ultrasound at 18-20 weeks which we have arranged today.  4. History of eclampsia: I discussed with Hinda KehrMaranda that the recurrence risk of preeclampsia varies between 11-25% depending on the case history. We discussed that the risk is increased in women with chronic medical conditions such as hypertension and diabetes, and fortunately she has no significant medical history that would further increase her risk of recurrent preeclampsia. We reviewed the signs and symptoms of preeclampsia and stressed the importance of early diagnosis and treatment. I recommend a baseline laboratory assessment to include a creatinine, baseline platelet count, and a baseline protein assessment. We also discussed the data regarding baby aspirin therapy to prevent recurrent preeclampsia.  Unfortunately, studies have found that there is only a very modest 10-17% reduction in the incidence of preeclampsia with the use of baby aspirin. In general, I reserve recommending baby aspirin treatment to women with a history of severe preeclampsia remote from term, as these women have the highest recurrence rate of preeclampsia. In Trannie's case, I do not feel strongly that baby aspirin therapy will significantly reduce her risk of recurrent preeclampsia, but if she feels more comfortable initiating this therapy I do not see any contraindication to it.   Thank you for the opportunity to be a part of the care of Masco CorporationMaranda L Demeyer. We will plan to see Hinda KehrMaranda again for return consultation at the time of her anatomy ultrasound. Please contact our office if we can be of further assistance in the interim.   I spent approximately 40 minutes  with this patient with over 50% of time spent in face-to-face counseling.  Darlyn ReadEmily Bunce, MD

## 2016-08-31 NOTE — Progress Notes (Signed)
Genetic Counseling  High-Risk Gestation Note  Appointment Date:  08/31/2016 Referred By: Baltazar Najjar, MD Date of Birth:  1991-07-22   Pregnancy History: [id number] Estimated Date of Delivery: 03/10/17  Estimated Gestational Age: [redacted]w[redacted]d Attending: EAbram Sander MD Genetic Counseling  Visit Summary Note  Ms. Cynthia Simasand her mother were seen for genetic counseling because of the ultrasound finding of increased nuchal translucency today.    In summary:  Reviewed ultrasound finding of increased nuchal translucency  Discussed associations with variation of normal, chromosome conditions, structural defects including heart defects, and single gene conditions  Discussed significance of prior screening for fetal aneuploidy  NIPS- MaterniT21 Plus within normal range  Offered additional screening  Detailed ultrasound- scheduled 10/18/16  [facility] screening (including ACOG carrier screening options)- declined today  Discussed option of diagnostic testing  Amniocentesis-declined  Reviewed family history concerns  Patient has personal history of heart murmur  MFM consultation today given patient's medical history  [name] today for nuchal translucency ultrasound. Nuchal translucency (NT) was visualized to be increased today, 3.5 mm at 64.5 mm CRL. Complete ultrasound results under separate cover.   We discussed that the fetal NT refers to a fluid filled space between the skin and soft tissues behind the cervical spine.  This space is traditionally measurable between 11 and 13.[redacted] weeks gestation and is considered enlarged at ~3 mm or greater.  This couple was counseled regarding the various common etiologies for an enlarged NT including: aneuploidy, single gene conditions, cardiac or great vessel abnormalities, lymphatic system failure, decreased fetal movement, and fetal anemia.    We reviewed  chromosomes, nondisjunction, and the common features and prognoses of Down syndrome and other aneuploidies.  Considering [name] age, an NT of 3.5 and a CRL of 62.6 mm, the risk for fetal aneuploidy is estimated to be ~1 in 20 (5%).   In addition, we discussed that an NT of 3.540mis associated with an approximate 3% risk for a fetal cardiac anomaly.  We also discussed single gene conditions and that these conditions are not routinely tested for prenatally unless ultrasound findings or family history significantly increase the suspicion of a specific single gene disorder.  We briefly reviewed common inheritance patterns (dominant, recessive, and X-linked) as well as the associated risks of recurrence.     [name] had noninvasive prenatal screening (NIPS)/prenatal cell free DNA testing, which was within normal limits. We reviewed the methodology of this screening, the conditions for which it assesses, and the sensitivity and specificity. Specifically, [name] MaterniT21 Plus through [employer] was negative for the conditions screened. MaterniT21 Plus assessed for aneuploidy for chromosomes 21, 18, 13, X, Y, 22, and 16. It also assessed for select microdeletions at 22q, 15q, 11q, 8q, 5p, 4p, and 1p. We reviewed that the sensitivity and specificity is lower for microdeletions than for trisomies. We reviewed that while highly sensitive and specific, this screen is not diagnostic and does not assess for all chromosome conditions.   We discussed the diagnostic testing option of amniocentesis for karyotype analysis and microarray analysis. We reviewed the risks, benefits, and limitations. We discussed the associated 1 in [id number] for complications for amniocentesis, including spontaneous pregnancy loss. After careful consideration, [name] invasive testing (amniocentesis) in the pregnancy.   We discussed the screening options of detailed ultrasound and fetal  echocardiogram to assess fetal structural anatomy in more  detail. We reviewed the benefits and limitations of ultrasound as a screening tool. Ultrasound cannot diagnose or rule out all birth defects or genetic conditions prenatally. Detailed ultrasound is scheduled 10/18/16, and fetal echocardiogram is scheduled with [facility] 10/05/16.   We discussed the option of expanded carrier screening for a panel of autosomal recessive and X-linked conditions. There are a myriad of genetic disorders that occur more frequently in specific ethnic groups, those which can be traced to particular geographic locations. We discussed that although these genetic disorders are much more prevalent in specific ethnic groups, as families are becoming increasingly multiracial and multicultural, these conditions can occur in anyone from any race or ethnicity. For this reason, we discussed the availability of ethnic specific genetic carrier screening, professional society (ACOG) recommended carrier testing, and pan-ethnic carrier screening.   We reviewed that ACOG currently recommends that all patients be offered carrier screening for cystic fibrosis, spinal muscular atrophy and hemoglobinopathies. In addition, they were counseled that there are a variety of genetic screening laboratories that have pan-ethnic, or expanded, carrier screening panels, which evaluate carrier status for a wide range of genetic conditions. Some of these conditions are severe and actionable, but also rare; others occur more commonly, but are less severe. We discussed that some may be associated with an increased nuchal translucency measurement on ultrasound, but many of the conditions on the panel would not be associated with increased nuchal translucency. We discussed that testing options range from screening for a single condition to panels of more than 200 autosomal or X-linked genetic conditions. We reviewed that the prevalence of  each condition varies (and often varies with ethnicity). Thus the couples' background risk to be a carrier for each of these various conditions would range, and in some cases be very low or unknown. Similarly, the detection rate varies with each condition and also varies in some cases with ethnicity, ranging from greater than 99% (in the case of hemoglobinopathies) to unknown. We reviewed that a negative carrier screen would thus reduce, but not eliminate the chance to be a carrier for these conditions. For some conditions included on specific pan-ethnic carrier screening panels, the pre-test carrier frequency and/or the detection rate is unknown, and the phenotype may not be well described in these cases. After careful consideration, [name] declined expanded carrier screening at this time.   They were counseled that the fetal prognosis depends on the underlying etiology of the enlarged NT and further anticipatory guidance can be provided if a diagnosis is discovered.    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 The patient was reportedly diagnosed with this after birth and followed by cardiology until age 38 years old. She did not require surgical correction. Congenital heart defects occur in approximately 1% of pregnancies.  Congenital heart defects may occur due to multifactorial influences, chromosomal abnormalities, genetic syndromes or environmental exposures.  Isolated heart defects are generally multifactorial.  Given the reported family history and assuming multifactorial inheritance, the risk for a congenital heart defect in the current pregnancy would be increased above the general population risk. We reviewed detailed ultrasound and fetal echocardiogram can assess fetal heart in pregnancy. Without further information regarding the provided family history, an accurate genetic risk cannot be calculated.  Further genetic counseling is warranted if more information is obtained.  [name] was seen for Maternal Fetal Medicine consultation  given her medical history. See separate MFM consult note for detailed discussion.   I counseled [name] regarding the above risks and available options.  The approximate face-to-face time with the genetic counselor was 40 minutes.     Chipper Oman, MS Certified Genetic Counselor 08/31/2016

## 2016-09-03 ENCOUNTER — Encounter: Payer: Self-pay | Admitting: Obstetrics and Gynecology

## 2016-09-03 ENCOUNTER — Other Ambulatory Visit (HOSPITAL_COMMUNITY): Payer: Self-pay | Admitting: *Deleted

## 2016-09-03 ENCOUNTER — Ambulatory Visit (INDEPENDENT_AMBULATORY_CARE_PROVIDER_SITE_OTHER): Payer: Medicaid Other | Admitting: Obstetrics and Gynecology

## 2016-09-03 DIAGNOSIS — O283 Abnormal ultrasonic finding on antenatal screening of mother: Secondary | ICD-10-CM

## 2016-09-03 DIAGNOSIS — O0992 Supervision of high risk pregnancy, unspecified, second trimester: Secondary | ICD-10-CM

## 2016-09-03 MED ORDER — PROMETHAZINE HCL 25 MG PO TABS: 25.0000 mg | ORAL_TABLET | Freq: Four times a day (QID) | ORAL | 1 refills | Status: DC | PRN

## 2016-09-03 NOTE — Progress Notes (Signed)
   PRENATAL VISIT NOTE  Subjective:  [name] is a 25 y.o. [phone] at 945w1d being seen today for ongoing prenatal care.  She is currently monitored for the following issues for this high-risk pregnancy and has Insufficient prenatal care in third trimester; Supervision of high-risk pregnancy; Cardiac arrest (HCC); Overdose of opiate or related narcotic/unintentional; Hepatitis C virus infection without hepatic coma; and Maternal substance abuse affecting newborn on her problem list.  Patient reports no complaints.  Contractions: Not present. Vag. Bleeding: None.  Movement: Present. Denies leaking of fluid.   The following portions of the patient's history were reviewed and updated as appropriate: allergies, current medications, past family history, past medical history, past social history, past surgical history and problem list. Problem list updated.  Objective:   Vitals:   09/03/16 1508  BP: 115/78  Pulse: 89  Weight: 149 lb (67.6 kg)    Fetal Status: Fetal Heart Rate (bpm): 136   Movement: Present     General:  Alert, oriented and cooperative. Patient is in no acute distress.  Skin: Skin is warm and dry. No rash noted.   Cardiovascular: Normal heart rate noted  Respiratory: Normal respiratory effort, no problems with respiration noted  Abdomen: Soft, gravid, appropriate for gestational age. Pain/Pressure: Absent     Pelvic:  Cervical exam deferred        Extremities: Normal range of motion.  Edema: None  Mental Status: Normal mood and affect. Normal behavior. Normal judgment and thought content.   Assessment and Plan:  Pregnancy: [id number] at 5845w1d  1. Supervision of high risk pregnancy in second trimester Patient is doing well without complaint.  Reviewed first trimester screen and Maternity 21 results Follow up fetal echo and anatomy ultrasound already scheduled Patient declined flu vaccine She plans to formula feed and use depo-provera for contraception  2. Maternal  substance abuse affecting newborn Currently on subutex 8 mg BID.  Will schedule NICU tour at a later visit  General obstetric precautions including but not limited to vaginal bleeding, contractions, leaking of fluid and fetal movement were reviewed in detail with the patient. Please refer to After Visit Summary for other counseling recommendations.  Return in about 4 weeks (around 10/01/2016).  Catalina AntiguaPeggy Lexus Shampine, MD

## 2016-10-01 ENCOUNTER — Encounter: Payer: Self-pay | Admitting: Obstetrics

## 2016-10-17 ENCOUNTER — Encounter (HOSPITAL_COMMUNITY): Payer: Self-pay

## 2016-10-18 ENCOUNTER — Other Ambulatory Visit (HOSPITAL_COMMUNITY): Payer: Self-pay | Admitting: Maternal & Fetal Medicine

## 2016-10-18 ENCOUNTER — Other Ambulatory Visit (HOSPITAL_COMMUNITY): Payer: Self-pay | Admitting: *Deleted

## 2016-10-18 ENCOUNTER — Encounter (HOSPITAL_COMMUNITY): Payer: Self-pay

## 2016-10-18 ENCOUNTER — Ambulatory Visit (HOSPITAL_COMMUNITY)
Admission: RE | Admit: 2016-10-18 | Discharge: 2016-10-18 | Disposition: A | Discharge status: Home / Self Care | Payer: Medicaid Other | Source: Ambulatory Visit | Attending: Obstetrics | Admitting: Obstetrics

## 2016-10-18 DIAGNOSIS — O09292 Supervision of pregnancy with other poor reproductive or obstetric history, second trimester: Secondary | ICD-10-CM | POA: Insufficient documentation

## 2016-10-18 DIAGNOSIS — B182 Chronic viral hepatitis C: Secondary | ICD-10-CM | POA: Diagnosis not present

## 2016-10-18 DIAGNOSIS — O283 Abnormal ultrasonic finding on antenatal screening of mother: Secondary | ICD-10-CM | POA: Diagnosis not present

## 2016-10-18 DIAGNOSIS — F1120 Opioid dependence, uncomplicated: Secondary | ICD-10-CM | POA: Insufficient documentation

## 2016-10-18 DIAGNOSIS — Z3A19 19 weeks gestation of pregnancy: Secondary | ICD-10-CM

## 2016-10-18 DIAGNOSIS — Z363 Encounter for antenatal screening for malformations: Secondary | ICD-10-CM | POA: Insufficient documentation

## 2016-10-18 DIAGNOSIS — O99320 Drug use complicating pregnancy, unspecified trimester: Secondary | ICD-10-CM

## 2016-10-18 DIAGNOSIS — O98419 Viral hepatitis complicating pregnancy, unspecified trimester: Secondary | ICD-10-CM

## 2016-10-18 DIAGNOSIS — O98412 Viral hepatitis complicating pregnancy, second trimester: Secondary | ICD-10-CM | POA: Insufficient documentation

## 2016-10-18 DIAGNOSIS — O09299 Supervision of pregnancy with other poor reproductive or obstetric history, unspecified trimester: Secondary | ICD-10-CM

## 2016-10-18 DIAGNOSIS — O99322 Drug use complicating pregnancy, second trimester: Secondary | ICD-10-CM | POA: Diagnosis not present

## 2016-11-19 NOTE — L&D Delivery Note (Signed)
Patient is 25 y.o. [id number] [redacted]w[redacted]d admitted SROM and SOL, hx of hepatitis C and subutex use   Delivery Note At 8:03 AM a viable female was delivered via Vaginal, Spontaneous Delivery (Presentation:ROA).  APGAR: 9, 9; weight  Per nursing report.    Placenta status: intact, spontaneously delivered.  Cord: 3 vessel with the following complications: None.  Cord pH: n/i  Anesthesia:  Epidural Episiotomy: None Lacerations: None Suture Repair: n/i Est. Blood Loss (mL): 100  Mom to postpartum.  Baby to Nursery.  Durenda Hurt 02/28/2017, 9:04 AM  Patient is a A5W0981 at [redacted]w[redacted]d who was admitted w/ SROM/early labor, significant hx of Subutex use, +Hep C/fetal cardiac concerns.  She progressed without augmentation.  I was gloved and present for delivery in its entirety.  Second stage of labor progressed, baby delivered after two contractions.  Mild decels during second stage noted.  Complications: none  Lacerations: none  EBL: 100cc  RN requested to notify Neo of infant cardiac concern for further plan.  Cam Hai, CNM 9:21 AM  02/28/2017

## 2016-11-21 ENCOUNTER — Ambulatory Visit (INDEPENDENT_AMBULATORY_CARE_PROVIDER_SITE_OTHER): Payer: Medicaid Other | Admitting: Obstetrics

## 2016-11-21 ENCOUNTER — Encounter: Payer: Self-pay | Admitting: Obstetrics

## 2016-11-21 VITALS — BP 101/69 | HR 83 | Wt 164.0 lb

## 2016-11-21 DIAGNOSIS — Z3480 Encounter for supervision of other normal pregnancy, unspecified trimester: Secondary | ICD-10-CM

## 2016-11-21 DIAGNOSIS — O99519 Diseases of the respiratory system complicating pregnancy, unspecified trimester: Secondary | ICD-10-CM

## 2016-11-21 DIAGNOSIS — K219 Gastro-esophageal reflux disease without esophagitis: Secondary | ICD-10-CM

## 2016-11-21 DIAGNOSIS — J45909 Unspecified asthma, uncomplicated: Secondary | ICD-10-CM

## 2016-11-21 DIAGNOSIS — O99512 Diseases of the respiratory system complicating pregnancy, second trimester: Secondary | ICD-10-CM

## 2016-11-21 MED ORDER — RANITIDINE HCL 150 MG PO TABS: 150.0000 mg | ORAL_TABLET | Freq: Two times a day (BID) | ORAL | 5 refills | Status: DC

## 2016-11-21 MED ORDER — ALBUTEROL SULFATE HFA 108 (90 BASE) MCG/ACT IN AERS: 2.0000 | INHALATION_SPRAY | Freq: Four times a day (QID) | RESPIRATORY_TRACT | 99 refills | Status: AC | PRN

## 2016-11-21 NOTE — Progress Notes (Signed)
Patient reports she has doing well.

## 2016-11-21 NOTE — Progress Notes (Signed)
Subjective:    Corlis LeakMaranda L Hucker is a 26 y.o. female being seen today for her obstetrical visit. She is at 3387w3d gestation. Patient reports: heartburn . Fetal movement: normal.  Problem List Items Addressed This Visit    None     Patient Active Problem List   Diagnosis Date Noted  . 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 Overdose of opiate or related narcotic/unintentional 08/06/2014  . Hepatitis C virus infection without hepatic coma 08/06/2014  . Cardiac arrest (HCC) 08/01/2014  . Supervision of high-risk pregnancy 12/31/2013  . Insufficient prenatal care in third trimester 12/15/2013   Objective:    BP 101/69   Pulse 83   Wt 164 lb (74.4 kg)   LMP 05/22/2016 (Approximate)   BMI 26.88 kg/m  FHT: 150 BPM  Uterine Size: size equals dates     Assessment:    Pregnancy @ 7587w3d    Plan:    OBGCT: ordered for next visit. Signs and symptoms of preterm labor: discussed.  Labs, problem list reviewed and updated 2 hr GTT planned Follow up in 4 weeks.  Patient ID: Corlis LeakMaranda L Vera, female   DOB: 12/09/1990, 26 y.o.   MRN: [id number]

## 2016-11-22 ENCOUNTER — Other Ambulatory Visit (HOSPITAL_COMMUNITY): Payer: Self-pay | Admitting: Maternal and Fetal Medicine

## 2016-11-22 ENCOUNTER — Ambulatory Visit (HOSPITAL_COMMUNITY)
Admission: RE | Admit: 2016-11-22 | Discharge: 2016-11-22 | Disposition: A | Discharge status: Home / Self Care | Payer: Medicaid Other | Source: Ambulatory Visit | Attending: Obstetrics | Admitting: Obstetrics

## 2016-11-22 ENCOUNTER — Encounter (HOSPITAL_COMMUNITY): Payer: Self-pay

## 2016-11-22 DIAGNOSIS — O99322 Drug use complicating pregnancy, second trimester: Secondary | ICD-10-CM

## 2016-11-22 DIAGNOSIS — Z3A24 24 weeks gestation of pregnancy: Secondary | ICD-10-CM | POA: Insufficient documentation

## 2016-11-22 DIAGNOSIS — B182 Chronic viral hepatitis C: Secondary | ICD-10-CM | POA: Diagnosis not present

## 2016-11-22 DIAGNOSIS — O283 Abnormal ultrasonic finding on antenatal screening of mother: Secondary | ICD-10-CM

## 2016-11-22 DIAGNOSIS — O09292 Supervision of pregnancy with other poor reproductive or obstetric history, second trimester: Secondary | ICD-10-CM | POA: Insufficient documentation

## 2016-11-22 DIAGNOSIS — O98412 Viral hepatitis complicating pregnancy, second trimester: Secondary | ICD-10-CM | POA: Insufficient documentation

## 2016-11-22 DIAGNOSIS — O98419 Viral hepatitis complicating pregnancy, unspecified trimester: Secondary | ICD-10-CM

## 2016-11-22 DIAGNOSIS — O99332 Smoking (tobacco) complicating pregnancy, second trimester: Secondary | ICD-10-CM

## 2016-11-23 ENCOUNTER — Other Ambulatory Visit (HOSPITAL_COMMUNITY): Payer: Self-pay | Admitting: *Deleted

## 2016-11-23 DIAGNOSIS — O35BXX0 Maternal care for other (suspected) fetal abnormality and damage, fetal cardiac anomalies, not applicable or unspecified: Secondary | ICD-10-CM

## 2016-11-23 DIAGNOSIS — O358XX0 Maternal care for other (suspected) fetal abnormality and damage, not applicable or unspecified: Secondary | ICD-10-CM

## 2016-12-19 ENCOUNTER — Other Ambulatory Visit: Payer: Self-pay

## 2016-12-19 ENCOUNTER — Encounter: Payer: Self-pay | Admitting: Obstetrics and Gynecology

## 2016-12-20 ENCOUNTER — Ambulatory Visit (HOSPITAL_COMMUNITY)
Admission: RE | Admit: 2016-12-20 | Discharge: 2016-12-20 | Disposition: A | Discharge status: Home / Self Care | Payer: Medicaid Other | Source: Ambulatory Visit | Attending: Obstetrics | Admitting: Obstetrics

## 2016-12-20 ENCOUNTER — Encounter (HOSPITAL_COMMUNITY): Payer: Self-pay

## 2016-12-20 DIAGNOSIS — O99323 Drug use complicating pregnancy, third trimester: Secondary | ICD-10-CM | POA: Insufficient documentation

## 2016-12-20 DIAGNOSIS — B182 Chronic viral hepatitis C: Secondary | ICD-10-CM | POA: Diagnosis not present

## 2016-12-20 DIAGNOSIS — O98413 Viral hepatitis complicating pregnancy, third trimester: Secondary | ICD-10-CM | POA: Diagnosis not present

## 2016-12-20 DIAGNOSIS — O283 Abnormal ultrasonic finding on antenatal screening of mother: Secondary | ICD-10-CM | POA: Diagnosis present

## 2016-12-20 DIAGNOSIS — Z3A28 28 weeks gestation of pregnancy: Secondary | ICD-10-CM | POA: Insufficient documentation

## 2016-12-20 DIAGNOSIS — O35BXX0 Maternal care for other (suspected) fetal abnormality and damage, fetal cardiac anomalies, not applicable or unspecified: Secondary | ICD-10-CM

## 2016-12-20 DIAGNOSIS — O99333 Smoking (tobacco) complicating pregnancy, third trimester: Secondary | ICD-10-CM | POA: Diagnosis not present

## 2016-12-20 DIAGNOSIS — O358XX0 Maternal care for other (suspected) fetal abnormality and damage, not applicable or unspecified: Secondary | ICD-10-CM

## 2016-12-20 DIAGNOSIS — O09293 Supervision of pregnancy with other poor reproductive or obstetric history, third trimester: Secondary | ICD-10-CM | POA: Diagnosis not present

## 2016-12-21 ENCOUNTER — Other Ambulatory Visit: Payer: Self-pay

## 2016-12-26 ENCOUNTER — Ambulatory Visit (INDEPENDENT_AMBULATORY_CARE_PROVIDER_SITE_OTHER): Payer: Medicaid Other | Admitting: Certified Nurse Midwife

## 2016-12-26 VITALS — BP 106/73 | HR 92 | Wt 162.0 lb

## 2016-12-26 DIAGNOSIS — Z23 Encounter for immunization: Secondary | ICD-10-CM | POA: Diagnosis not present

## 2016-12-26 DIAGNOSIS — O0993 Supervision of high risk pregnancy, unspecified, third trimester: Secondary | ICD-10-CM

## 2016-12-26 DIAGNOSIS — B182 Chronic viral hepatitis C: Secondary | ICD-10-CM

## 2016-12-26 DIAGNOSIS — O0933 Supervision of pregnancy with insufficient antenatal care, third trimester: Secondary | ICD-10-CM

## 2016-12-26 DIAGNOSIS — O0930 Supervision of pregnancy with insufficient antenatal care, unspecified trimester: Secondary | ICD-10-CM

## 2016-12-26 NOTE — Progress Notes (Signed)
   PRENATAL VISIT NOTE  Subjective:  [name] is a 26 y.o. [phone] at 4w3dbeing seen today for ongoing prenatal care.  She is currently monitored for the following issues for this high-risk pregnancy and has Insufficient prenatal care in third trimester; Supervision of high-risk pregnancy; Cardiac arrest ([place]; Overdose of opiate or related narcotic/unintentional; Hepatitis C virus infection without hepatic coma; and Maternal substance abuse affecting newborn on her problem list.  Patient reports no complaints.  Contractions: Not present. Vag. Bleeding: None.  Movement: Present. Denies leaking of fluid.   The following portions of the patient's history were reviewed and updated as appropriate: allergies, current medications, past family history, past medical history, past social history, past surgical history and problem list. Problem list updated.  Objective:   Vitals:   12/26/16 1310  BP: 106/73  Pulse: 92  Weight: 162 lb (73.5 kg)    Fetal Status: Fetal Heart Rate (bpm): 132 Fundal Height: 27 cm Movement: Present     General:  Alert, oriented and cooperative. Patient is in no acute distress.  Skin: Skin is warm and dry. No rash noted.   Cardiovascular: Normal heart rate noted  Respiratory: Normal respiratory effort, no problems with respiration noted  Abdomen: Soft, gravid, appropriate for gestational age. Pain/Pressure: Absent     Pelvic:  Cervical exam deferred        Extremities: Normal range of motion.     Mental Status: Normal mood and affect. Normal behavior. Normal judgment and thought content.   Assessment and Plan:  Pregnancy: [id number] 251w3d1. Supervision of high risk pregnancy in third trimester    NST reviewed with Dr. HaJodi Mourning Reactive 10X10.  Occasional variable. Noted. No contractions.  - Fetal nonstress test; Future - Ambulatory referral to Neonatology  2. Maternal substance abuse affecting newborn    Taking Subutex - Ambulatory referral to  Neonatology  3. Limited prenatal care, antepartum SW met with patient in office, states that she was having transportation issues.  Needs 2 hour OGTT ASAP, discussed with patient. This is her 3rd PNV this pregnancy.   - Fetal nonstress test; Future - Ambulatory referral to Neonatology  4. Chronic hepatitis C without hepatic coma (HCC)      Has seen ID in the past, will attempt treatment after the pregnancy is completed.    Preterm labor symptoms and general obstetric precautions including but not limited to vaginal bleeding, contractions, leaking of fluid and fetal movement were reviewed in detail with the patient. Please refer to After Visit Summary for other counseling recommendations.  Return in about 2 weeks (around 01/09/2017) [facility]   RaMorene CrockerCNM

## 2016-12-26 NOTE — Addendum Note (Signed)
Addended by: Marya LandryFOSTER, Ailene Royal D on: 12/26/2016 04:24 PM   Modules accepted: Orders

## 2017-01-03 ENCOUNTER — Other Ambulatory Visit: Payer: Self-pay

## 2017-01-09 ENCOUNTER — Ambulatory Visit (INDEPENDENT_AMBULATORY_CARE_PROVIDER_SITE_OTHER): Payer: Medicaid Other | Admitting: Certified Nurse Midwife

## 2017-01-09 VITALS — BP 123/83 | HR 109 | Wt 163.0 lb

## 2017-01-09 DIAGNOSIS — B182 Chronic viral hepatitis C: Secondary | ICD-10-CM

## 2017-01-09 DIAGNOSIS — O0993 Supervision of high risk pregnancy, unspecified, third trimester: Secondary | ICD-10-CM

## 2017-01-09 DIAGNOSIS — I469 Cardiac arrest, cause unspecified: Secondary | ICD-10-CM

## 2017-01-09 DIAGNOSIS — O0933 Supervision of pregnancy with insufficient antenatal care, third trimester: Secondary | ICD-10-CM

## 2017-01-09 NOTE — Progress Notes (Signed)
   PRENATAL VISIT NOTE  Subjective:  [name] is a 26 y.o. [phone] at 441w3d being seen today for ongoing prenatal care.  She is currently monitored for the following issues for this high-risk pregnancy and has Insufficient prenatal care in third trimester; Supervision of high-risk pregnancy; Cardiac arrest (HCC); Overdose of opiate or related narcotic/unintentional; Hepatitis C virus infection without hepatic coma; and Maternal substance abuse affecting newborn on her problem list.  Patient reports no complaints.  Contractions: Irregular. Vag. Bleeding: None.  Movement: Present. Denies leaking of fluid.   The following portions of the patient's history were reviewed and updated as appropriate: allergies, current medications, past family history, past medical history, past social history, past surgical history and problem list. Problem list updated.  Objective:   Vitals:   01/09/17 1453  BP: 123/83  Pulse: (!) 109  Weight: 163 lb (73.9 kg)    Fetal Status: Fetal Heart Rate (bpm): 141 Fundal Height: 29 cm Movement: Present     General:  Alert, oriented and cooperative. Patient is in no acute distress.  Skin: Skin is warm and dry. No rash noted.   Cardiovascular: Normal heart rate noted  Respiratory: Normal respiratory effort, no problems with respiration noted  Abdomen: Soft, gravid, appropriate for gestational age. Pain/Pressure: Present     Pelvic:  Cervical exam deferred        Extremities: Normal range of motion.     Mental Status: Normal mood and affect. Normal behavior. Normal judgment and thought content.   Assessment and Plan:  Pregnancy: [id number] at 611w3d  1. Supervision of high risk pregnancy in third trimester     Doing well  2. Cardiac arrest (HCC)     In 2015.  Reviewed High Risk criteria with Dr. Debroah LoopArnold.   3. Chronic hepatitis C without hepatic coma (HCC)       4. Maternal substance abuse affecting newborn     On Subutex.  NICU consult scheduled.   5.  Insufficient prenatal care in third trimester     Is now signed up for transportation.  SW aware of situation.    Preterm labor symptoms and general obstetric precautions including but not limited to vaginal bleeding, contractions, leaking of fluid and fetal movement were reviewed in detail with the patient. Please refer to After Visit Summary for other counseling recommendations.  Return in about 2 weeks (around 01/23/2017) for ROB, Needs NICU consult.   Roe Coombsachelle A Sebron Mcmahill, CNM

## 2017-01-09 NOTE — Progress Notes (Signed)
Pt states she has had a few ctx over the past few days

## 2017-01-17 ENCOUNTER — Ambulatory Visit (HOSPITAL_COMMUNITY): Admission: RE | Admit: 2017-01-17 | Payer: Medicaid Other | Source: Ambulatory Visit

## 2017-01-23 ENCOUNTER — Encounter: Payer: Self-pay | Admitting: Certified Nurse Midwife

## 2017-01-23 ENCOUNTER — Other Ambulatory Visit (HOSPITAL_COMMUNITY): Payer: Self-pay | Admitting: Maternal & Fetal Medicine

## 2017-01-23 ENCOUNTER — Ambulatory Visit (HOSPITAL_COMMUNITY)
Admission: RE | Admit: 2017-01-23 | Discharge: 2017-01-23 | Disposition: A | Discharge status: Home / Self Care | Payer: Medicaid Other | Source: Ambulatory Visit | Attending: Obstetrics | Admitting: Obstetrics

## 2017-01-23 ENCOUNTER — Encounter (HOSPITAL_COMMUNITY): Payer: Self-pay

## 2017-01-23 DIAGNOSIS — O99333 Smoking (tobacco) complicating pregnancy, third trimester: Secondary | ICD-10-CM | POA: Diagnosis not present

## 2017-01-23 DIAGNOSIS — O283 Abnormal ultrasonic finding on antenatal screening of mother: Secondary | ICD-10-CM | POA: Diagnosis not present

## 2017-01-23 DIAGNOSIS — B182 Chronic viral hepatitis C: Secondary | ICD-10-CM | POA: Diagnosis not present

## 2017-01-23 DIAGNOSIS — O358XX0 Maternal care for other (suspected) fetal abnormality and damage, not applicable or unspecified: Secondary | ICD-10-CM

## 2017-01-23 DIAGNOSIS — Z3A33 33 weeks gestation of pregnancy: Secondary | ICD-10-CM

## 2017-01-23 DIAGNOSIS — O98413 Viral hepatitis complicating pregnancy, third trimester: Secondary | ICD-10-CM | POA: Diagnosis present

## 2017-01-23 DIAGNOSIS — O35BXX0 Maternal care for other (suspected) fetal abnormality and damage, fetal cardiac anomalies, not applicable or unspecified: Secondary | ICD-10-CM

## 2017-01-23 DIAGNOSIS — O99323 Drug use complicating pregnancy, third trimester: Secondary | ICD-10-CM | POA: Insufficient documentation

## 2017-01-23 DIAGNOSIS — O09293 Supervision of pregnancy with other poor reproductive or obstetric history, third trimester: Secondary | ICD-10-CM | POA: Insufficient documentation

## 2017-01-23 NOTE — ED Notes (Signed)
I spoke to Hoy FinlayHeather Carter, RN earlier today regarding possible NAS tour today.  Unavailable today, Herbert SetaHeather has tried to contact pt and schedule tour.  Pt phone number updated today.  NAS pamphlet given to pt.  Encouraged pt to call and schedule an NAS tour.  Pt voiced understanding.

## 2017-01-30 ENCOUNTER — Encounter: Payer: Self-pay | Admitting: Certified Nurse Midwife

## 2017-02-11 ENCOUNTER — Encounter: Payer: Self-pay | Admitting: Certified Nurse Midwife

## 2017-02-14 ENCOUNTER — Ambulatory Visit (HOSPITAL_COMMUNITY): Admission: RE | Admit: 2017-02-14 | Payer: Medicaid Other | Source: Ambulatory Visit

## 2017-02-27 ENCOUNTER — Telehealth (HOSPITAL_COMMUNITY): Payer: Self-pay | Admitting: *Deleted

## 2017-02-28 ENCOUNTER — Inpatient Hospital Stay (HOSPITAL_COMMUNITY)
Admission: AD | Admit: 2017-02-28 | Discharge: 2017-03-02 | DRG: 774 | Disposition: A | Discharge status: Home / Self Care | Payer: Medicaid Other | Source: Ambulatory Visit | Attending: Obstetrics and Gynecology | Admitting: Obstetrics and Gynecology

## 2017-02-28 ENCOUNTER — Inpatient Hospital Stay (HOSPITAL_COMMUNITY): Payer: Medicaid Other | Admitting: Anesthesiology

## 2017-02-28 ENCOUNTER — Encounter: Payer: Self-pay | Admitting: Certified Nurse Midwife

## 2017-02-28 ENCOUNTER — Encounter (HOSPITAL_COMMUNITY): Payer: Self-pay | Admitting: *Deleted

## 2017-02-28 DIAGNOSIS — F1990 Other psychoactive substance use, unspecified, uncomplicated: Secondary | ICD-10-CM | POA: Diagnosis present

## 2017-02-28 DIAGNOSIS — Z87891 Personal history of nicotine dependence: Secondary | ICD-10-CM | POA: Diagnosis not present

## 2017-02-28 DIAGNOSIS — O99824 Streptococcus B carrier state complicating childbirth: Secondary | ICD-10-CM | POA: Diagnosis present

## 2017-02-28 DIAGNOSIS — O4202 Full-term premature rupture of membranes, onset of labor within 24 hours of rupture: Secondary | ICD-10-CM | POA: Diagnosis present

## 2017-02-28 DIAGNOSIS — Z8674 Personal history of sudden cardiac arrest: Secondary | ICD-10-CM | POA: Diagnosis not present

## 2017-02-28 DIAGNOSIS — Z3A38 38 weeks gestation of pregnancy: Secondary | ICD-10-CM

## 2017-02-28 DIAGNOSIS — B1920 Unspecified viral hepatitis C without hepatic coma: Secondary | ICD-10-CM | POA: Diagnosis present

## 2017-02-28 DIAGNOSIS — Z8249 Family history of ischemic heart disease and other diseases of the circulatory system: Secondary | ICD-10-CM | POA: Diagnosis not present

## 2017-02-28 DIAGNOSIS — O99324 Drug use complicating childbirth: Secondary | ICD-10-CM | POA: Diagnosis present

## 2017-02-28 DIAGNOSIS — O9842 Viral hepatitis complicating childbirth: Secondary | ICD-10-CM | POA: Diagnosis present

## 2017-02-28 DIAGNOSIS — O0933 Supervision of pregnancy with insufficient antenatal care, third trimester: Secondary | ICD-10-CM

## 2017-02-28 DIAGNOSIS — B182 Chronic viral hepatitis C: Secondary | ICD-10-CM

## 2017-02-28 LAB — RAPID URINE DRUG SCREEN, HOSP PERFORMED
Amphetamines: NOT DETECTED
BARBITURATES: NOT DETECTED
BENZODIAZEPINES: NOT DETECTED
Cocaine: NOT DETECTED
Opiates: NOT DETECTED
TETRAHYDROCANNABINOL: POSITIVE — AB

## 2017-02-28 LAB — TYPE AND SCREEN
ABO/RH(D): O POS
ANTIBODY SCREEN: NEGATIVE

## 2017-02-28 LAB — CBC
HEMATOCRIT: 33.7 % — AB (ref 36.0–46.0)
Hemoglobin: 11 g/dL — ABNORMAL LOW (ref 12.0–15.0)
MCH: 27.9 pg (ref 26.0–34.0)
MCHC: 32.6 g/dL (ref 30.0–36.0)
MCV: 85.5 fL (ref 78.0–100.0)
PLATELETS: 331 10*3/uL (ref 150–400)
RBC: 3.94 MIL/uL (ref 3.87–5.11)
RDW: 12.4 % (ref 11.5–15.5)
WBC: 13.2 10*3/uL — AB (ref 4.0–10.5)

## 2017-02-28 LAB — RPR: RPR Ser Ql: NONREACTIVE

## 2017-02-28 LAB — POCT FERN TEST: POCT Fern Test: POSITIVE

## 2017-02-28 LAB — MRSA PCR SCREENING: MRSA by PCR: NEGATIVE

## 2017-02-28 MED ORDER — OXYCODONE-ACETAMINOPHEN 5-325 MG PO TABS: 1.0000 | ORAL_TABLET | ORAL | Status: DC | PRN

## 2017-02-28 MED ORDER — WITCH HAZEL-GLYCERIN EX PADS: 1.0000 "application " | MEDICATED_PAD | CUTANEOUS | Status: DC | PRN

## 2017-02-28 MED ORDER — LIDOCAINE HCL (PF) 1 % IJ SOLN
30.0000 mL | INTRAMUSCULAR | Status: DC | PRN
  Filled 2017-02-28: qty 30

## 2017-02-28 MED ORDER — PHENYLEPHRINE 40 MCG/ML (10ML) SYRINGE FOR IV PUSH (FOR BLOOD PRESSURE SUPPORT): 80.0000 ug | PREFILLED_SYRINGE | INTRAVENOUS | Status: DC | PRN

## 2017-02-28 MED ORDER — ONDANSETRON HCL 4 MG/2ML IJ SOLN
4.0000 mg | Freq: Four times a day (QID) | INTRAMUSCULAR | Status: DC | PRN
  Filled 2017-02-28: qty 2

## 2017-02-28 MED ORDER — BUPRENORPHINE HCL 8 MG SL SUBL
8.0000 mg | SUBLINGUAL_TABLET | Freq: Once | SUBLINGUAL | Status: AC
Start: 2017-02-28 — End: 2017-02-28
  Administered 2017-02-28: 8 mg via SUBLINGUAL
  Filled 2017-02-28: qty 1

## 2017-02-28 MED ORDER — BUPRENORPHINE HCL 8 MG SL SUBL
8.0000 mg | SUBLINGUAL_TABLET | Freq: Every day | SUBLINGUAL | Status: DC
Start: 2017-02-28 — End: 2017-02-28

## 2017-02-28 MED ORDER — PHENYLEPHRINE 40 MCG/ML (10ML) SYRINGE FOR IV PUSH (FOR BLOOD PRESSURE SUPPORT)
PREFILLED_SYRINGE | INTRAVENOUS | Status: AC
  Filled 2017-02-28: qty 20

## 2017-02-28 MED ORDER — DIPHENHYDRAMINE HCL 50 MG/ML IJ SOLN: 12.5000 mg | INTRAMUSCULAR | Status: DC | PRN

## 2017-02-28 MED ORDER — LACTATED RINGERS IV SOLN: 500.0000 mL | Freq: Once | INTRAVENOUS | Status: DC

## 2017-02-28 MED ORDER — DIBUCAINE 1 % RE OINT: 1.0000 "application " | TOPICAL_OINTMENT | RECTAL | Status: DC | PRN

## 2017-02-28 MED ORDER — ZOLPIDEM TARTRATE 5 MG PO TABS: 5.0000 mg | ORAL_TABLET | Freq: Every evening | ORAL | Status: DC | PRN

## 2017-02-28 MED ORDER — COCONUT OIL OIL: 1.0000 "application " | TOPICAL_OIL | Status: DC | PRN

## 2017-02-28 MED ORDER — BENZOCAINE-MENTHOL 20-0.5 % EX AERO: 1.0000 "application " | INHALATION_SPRAY | CUTANEOUS | Status: DC | PRN

## 2017-02-28 MED ORDER — ACETAMINOPHEN 325 MG PO TABS: 650.0000 mg | ORAL_TABLET | ORAL | Status: DC | PRN

## 2017-02-28 MED ORDER — DIPHENHYDRAMINE HCL 25 MG PO CAPS: 25.0000 mg | ORAL_CAPSULE | Freq: Four times a day (QID) | ORAL | Status: DC | PRN

## 2017-02-28 MED ORDER — PRENATAL MULTIVITAMIN CH
1.0000 | ORAL_TABLET | Freq: Every day | ORAL | Status: DC
  Administered 2017-03-01 – 2017-03-02 (×2): 1 via ORAL
  Filled 2017-02-28 (×2): qty 1

## 2017-02-28 MED ORDER — EPHEDRINE 5 MG/ML INJ: 10.0000 mg | INTRAVENOUS | Status: DC | PRN

## 2017-02-28 MED ORDER — LIDOCAINE HCL (PF) 1 % IJ SOLN
INTRAMUSCULAR | Status: DC | PRN
Start: 2017-02-28 — End: 2017-02-28
  Administered 2017-02-28: 3 mL
  Administered 2017-02-28: 5 mL
  Administered 2017-02-28: 2 mL

## 2017-02-28 MED ORDER — ONDANSETRON HCL 4 MG PO TABS: 4.0000 mg | ORAL_TABLET | ORAL | Status: DC | PRN

## 2017-02-28 MED ORDER — SIMETHICONE 80 MG PO CHEW
80.0000 mg | CHEWABLE_TABLET | ORAL | Status: DC | PRN
Start: 2017-02-28 — End: 2017-03-02

## 2017-02-28 MED ORDER — SENNOSIDES-DOCUSATE SODIUM 8.6-50 MG PO TABS: 2.0000 | ORAL_TABLET | ORAL | Status: DC

## 2017-02-28 MED ORDER — ONDANSETRON HCL 4 MG/2ML IJ SOLN: 4.0000 mg | INTRAMUSCULAR | Status: DC | PRN

## 2017-02-28 MED ORDER — VITAMIN K1 1 MG/0.5ML IJ SOLN
INTRAMUSCULAR | Status: AC
  Filled 2017-02-28: qty 0.5

## 2017-02-28 MED ORDER — SENNOSIDES-DOCUSATE SODIUM 8.6-50 MG PO TABS
2.0000 | ORAL_TABLET | ORAL | Status: DC
  Administered 2017-03-01 (×2): 2 via ORAL
  Filled 2017-02-28 (×2): qty 2

## 2017-02-28 MED ORDER — SIMETHICONE 80 MG PO CHEW: 80.0000 mg | CHEWABLE_TABLET | ORAL | Status: DC | PRN

## 2017-02-28 MED ORDER — OXYTOCIN 40 UNITS IN LACTATED RINGERS INFUSION - SIMPLE MED
2.5000 [IU]/h | INTRAVENOUS | Status: DC
Start: 2017-02-28 — End: 2017-02-28
  Filled 2017-02-28: qty 1000

## 2017-02-28 MED ORDER — OXYTOCIN BOLUS FROM INFUSION
500.0000 mL | Freq: Once | INTRAVENOUS | Status: AC
  Administered 2017-02-28: 500 mL via INTRAVENOUS

## 2017-02-28 MED ORDER — IBUPROFEN 600 MG PO TABS
600.0000 mg | ORAL_TABLET | Freq: Four times a day (QID) | ORAL | Status: DC
  Administered 2017-02-28 – 2017-03-02 (×8): 600 mg via ORAL
  Filled 2017-02-28 (×9): qty 1

## 2017-02-28 MED ORDER — IBUPROFEN 600 MG PO TABS: 600.0000 mg | ORAL_TABLET | Freq: Four times a day (QID) | ORAL | Status: DC

## 2017-02-28 MED ORDER — FENTANYL 2.5 MCG/ML BUPIVACAINE 1/10 % EPIDURAL INFUSION (WH - ANES)
INTRAMUSCULAR | Status: AC
  Filled 2017-02-28: qty 100

## 2017-02-28 MED ORDER — PENICILLIN G POTASSIUM 5000000 UNITS IJ SOLR
5.0000 10*6.[IU] | Freq: Once | INTRAVENOUS | Status: AC
  Administered 2017-02-28: 5 10*6.[IU] via INTRAVENOUS
  Filled 2017-02-28: qty 5

## 2017-02-28 MED ORDER — LACTATED RINGERS IV SOLN: INTRAVENOUS | Status: DC

## 2017-02-28 MED ORDER — SOD CITRATE-CITRIC ACID 500-334 MG/5ML PO SOLN: 30.0000 mL | ORAL | Status: DC | PRN

## 2017-02-28 MED ORDER — PENICILLIN G POT IN DEXTROSE 60000 UNIT/ML IV SOLN
3.0000 10*6.[IU] | INTRAVENOUS | Status: DC
  Filled 2017-02-28 (×2): qty 50

## 2017-02-28 MED ORDER — OXYCODONE-ACETAMINOPHEN 5-325 MG PO TABS: 2.0000 | ORAL_TABLET | ORAL | Status: DC | PRN

## 2017-02-28 MED ORDER — FENTANYL 2.5 MCG/ML BUPIVACAINE 1/10 % EPIDURAL INFUSION (WH - ANES)
14.0000 mL/h | INTRAMUSCULAR | Status: DC | PRN
  Administered 2017-02-28 (×2): 14 mL/h via EPIDURAL
  Filled 2017-02-28: qty 100

## 2017-02-28 MED ORDER — TETANUS-DIPHTH-ACELL PERTUSSIS 5-2.5-18.5 LF-MCG/0.5 IM SUSP: 0.5000 mL | Freq: Once | INTRAMUSCULAR | Status: DC

## 2017-02-28 MED ORDER — ACETAMINOPHEN 325 MG PO TABS
650.0000 mg | ORAL_TABLET | ORAL | Status: DC | PRN
Start: 2017-02-28 — End: 2017-03-02

## 2017-02-28 MED ORDER — PRENATAL MULTIVITAMIN CH: 1.0000 | ORAL_TABLET | Freq: Every day | ORAL | Status: DC

## 2017-02-28 MED ORDER — LACTATED RINGERS IV SOLN: 500.0000 mL | INTRAVENOUS | Status: DC | PRN

## 2017-02-28 NOTE — Progress Notes (Signed)
UR chart review completed.  

## 2017-02-28 NOTE — MAU Note (Addendum)
Pt. Transferred via WC to LDR #[id number].  Report given to L&D nurse. Stable patient, ready for epidural placement. FOB at the [facility].

## 2017-02-28 NOTE — Progress Notes (Signed)
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

## 2017-02-28 NOTE — Progress Notes (Signed)
Patient ID: [name], female   DOB: [birth year], 26 y.o.   MRN: [id number]  Comfortable w/ epidural; PCN x 1 dose; no urge to push VSS, afeb FHR 135-145, +accels, occ mi variables Ctx q 3 mins, spont Cx C/C/+1 per RN exam @ 0630  IUP@term  End 1st stage  Will begin pushing w/ urge Anticipate SVD  Cam Hai CNM 02/28/2017 9:20 AM

## 2017-02-28 NOTE — Anesthesia Preprocedure Evaluation (Signed)
Anesthesia Evaluation  Patient identified by MRN, date of birth, ID band Patient awake    Reviewed: Allergy & Precautions, Patient's Chart, lab work & pertinent test results  Airway Mallampati: II  TM Distance: >3 FB     Dental   Pulmonary former smoker,    Pulmonary exam normal        Cardiovascular negative cardio ROS Normal cardiovascular exam     Neuro/Psych negative neurological ROS     GI/Hepatic negative GI ROS, (+) Hepatitis -, C  Endo/Other  negative endocrine ROS  Renal/GU negative Renal ROS     Musculoskeletal   Abdominal   Peds  Hematology  (+) anemia ,   Anesthesia Other Findings   Reproductive/Obstetrics (+) Pregnancy                             Lab Results  Component Value Date   WBC 13.2 (H) 02/28/2017   HGB 11.0 (L) 02/28/2017   HCT 33.7 (L) 02/28/2017   MCV 85.5 02/28/2017   PLT 331 02/28/2017    Anesthesia Physical Anesthesia Plan  ASA: II  Anesthesia Plan: Epidural   Post-op Pain Management:    Induction:   Airway Management Planned: Natural Airway  Additional Equipment:   Intra-op Plan:   Post-operative Plan:   Informed Consent: I have reviewed the patients History and Physical, chart, labs and discussed the procedure including the risks, benefits and alternatives for the proposed anesthesia with the patient or authorized representative who has indicated his/her understanding and acceptance.     Plan Discussed with:   Anesthesia Plan Comments:         Anesthesia Quick Evaluation

## 2017-02-28 NOTE — MAU Note (Signed)
Pt. Here with c/o contractions that started around 2200 last night, contractions every 7-8 mins apart,  and possible SROM around 2000-2100 last night, pt. not sure of fluid color, moderate amount.  Upon visual inspection, vaginal area wet, fluid collected for FERN test.  Positive for fetal movement, denies vaginal bleeding.  EFM applied - FHR 140s, toco applied - abd. Soft.

## 2017-02-28 NOTE — Anesthesia Pain Management Evaluation Note (Signed)
  CRNA Pain Management Visit Note  Patient: [name], 26 y.o., female  "Hello I am a member of the anesthesia team at [facility]. We have an anesthesia team available at all times to provide care throughout the hospital, including epidural management and anesthesia for C-section. I don't know your plan for the delivery whether it a natural birth, water birth, IV sedation, nitrous supplementation, doula or epidural, but we want to meet your pain goals."   1.Was your pain managed to your expectations on prior hospitalizations?   Yes   2.What is your expectation for pain management during this hospitalization?     Epidural  3.How can we help you reach that goal? Pt currently has epidural in place and is resting comfortably.  Record the patient's initial score and the patient's pain goal.   Pain: Patient sleeping - unable to assess  Pain Goal: Patient sleeping - unable to assess [facility] wants you to be able to say your pain was always managed very well.  Kely Dohn 02/28/2017

## 2017-02-28 NOTE — Anesthesia Procedure Notes (Signed)
Epidural Patient location during procedure: OB  Staffing Anesthesiologist: Marcene Duos Performed: anesthesiologist   Preanesthetic Checklist Completed: patient identified, site marked, surgical consent, pre-op evaluation, timeout performed, IV checked, risks and benefits discussed and monitors and equipment checked  Epidural Patient position: sitting Prep: site prepped and draped and DuraPrep Patient monitoring: continuous pulse ox and blood pressure Approach: midline Location: L4-L5 Injection technique: LOR saline  Needle:  Needle type: Tuohy  Needle gauge: 17 G Needle length: 9 cm and 9 Needle insertion depth: 6 cm Catheter type: closed end flexible Catheter size: 19 Gauge Catheter at skin depth: 12 (11-->12cm when laid in lat decub position) cm Test dose: negative  Assessment Events: blood not aspirated, injection not painful, no injection resistance, negative IV test and no paresthesia

## 2017-02-28 NOTE — Progress Notes (Signed)
[place] registry show pt being prescribed both BID and TID dosing of Subutex (one week it's BID, the next few weeks it's TID, then goes back to BID, etc).  Will need to verify w/Pt's prescribing MD in the morning. Ordered Subutex  x1 for tonight.

## 2017-02-28 NOTE — MAU Note (Signed)
GBS swab collected and sent to lab.

## 2017-02-28 NOTE — H&P (Signed)
[name] is a 26 y.o. female [phone] @ 38.4 wks by 12 wk U/S presenting for leaking fluid since 2000. Reports reg ctx increasing since that time. Denies bldg. Her preg has been followed by the [facility] office and has been remarkable for 1) hx eclampsia 1st preg 2) Hep C pos- for tx after preg 3) Subutex 4) hx opiate OD & cardiac arrest 2015 5) thickened NT w/ nl NIPS 6) fetal mild tricuspid insufficiency- no f/u echo 7) GBS bacteruria 07/2016  OB History    Gravida Para Term Preterm AB Living   0 1 3   SAB TAB Ectopic Multiple Live Births   0 1 0 0 3      Obstetric Comments   Hx of preeclampsia     Past Medical History:  Diagnosis Date  . Bacterial infection   . Family history of varicose veins   . FHx: hypertension   . FHx: scoliosis   . FHx: thyroid disease   . H/O varicella   . Heart murmur   . Hepatitis    hepatitis C- 2015, confirmed this year  . Hx: UTI (urinary tract infection) 2012  . No pertinent past medical history   . Postpartum eclampsia 08/2006   Past Surgical History:  Procedure Laterality Date  . NO PAST SURGERIES     Family History: family history includes Anemia in her mother; Depression in her maternal grandmother and mother; Heart disease in her maternal grandmother and paternal grandfather; Hypertension in her father. Social History:  reports that she quit smoking about 6 months ago. Her smoking use included Cigarettes. She smoked 0.25 packs per day. She has never used smokeless tobacco. She reports that she uses drugs, including Marijuana. She reports that she does not drink alcohol.     Maternal Diabetes: No- no glucola Genetic Screening: Normal thickened NT;  Nl NIPS Maternal Ultrasounds/Referrals: Abnormal:  Findings:   Other: mild tricuspid insuff;- no f/u echo Fetal Ultrasounds or other Referrals:  Fetal echo, Referred to Materal Fetal Medicine  Maternal Substance Abuse:  Yes:  Type: Other:  hx opioid OD; THC; prev smoker Significant  Maternal Medications:  Meds include: Other:  Subutex Significant Maternal Lab Results:  Lab values include: Group B Strep positive, Other: Hep C pos Other Comments:  None  ROS History Dilation: 3 Effacement (%): 60 Exam by:: Katrinka Blazing, CNM Blood pressure 121/74, pulse 97, temperature 97.9 F (36.6 C), resp. rate 17, height  (1.651 m), weight 74.8 kg (165 lb), last menstrual period 05/22/2016, SpO2 98 %, unknown if currently breastfeeding. Exam Physical Exam  Constitutional: She is oriented to person, place, and time. She appears well-developed.  HENT:  Head: Normocephalic.  Neck: Normal range of motion.  Cardiovascular: Normal rate.   Respiratory: Effort normal.  GI:  EFM 150s, +accels, no decels Ctx- difficult to trace  Genitourinary:  Genitourinary Comments: +pool/fern  Musculoskeletal: Normal range of motion.  Neurological: She is alert and oriented to person, place, and time.  Skin: Skin is warm and dry.  Psychiatric: She has a normal mood and affect. Her behavior is normal. Thought content normal.    Prenatal labs: ABO, Rh: O/Positive/-- (09/12 1633) Antibody: Negative (09/12 1633) Rubella: 6.65 (09/12 1633) RPR: Non Reactive (09/12 1633)  HBsAg: Negative (09/12 1633)  HIV: Non Reactive (09/12 1633)  GBS:   pos (urine- 07/2016)  Assessment/Plan: IUP@38 .4wks SROM Latent labor Subutex use +Hep C Fetal cardiac concern  Admit to [facility] Expectant management [facility]  for GBS ppx Peds to f/u w/ tricuspid insuff. Precaution for no IFSE Continue Subutex dosing  tid Anticipate SVD   Cam Hai CNM 02/28/2017, 3:15 AM

## 2017-02-28 NOTE — Anesthesia Postprocedure Evaluation (Signed)
Anesthesia Post Note  Patient: [name]  Procedure(s) Performed: * No procedures listed *  Patient location during evaluation: Mother Baby Anesthesia Type: Epidural Level of consciousness: awake and alert Pain management: pain level controlled Vital Signs Assessment: post-procedure vital signs reviewed and stable Respiratory status: spontaneous breathing Cardiovascular status: blood pressure returned to baseline and stable Postop Assessment: no headache, no backache, epidural receding, patient able to bend at knees, no signs of nausea or vomiting and adequate PO intake Anesthetic complications: no        Last Vitals:  Vitals:   02/28/17 0847 02/28/17 0945  BP:  114/81  Pulse:  79  Resp: 18 16  Temp:  37.3 C    Last Pain:  Vitals:   02/28/17 0945  TempSrc: Oral  PainSc:    Pain Goal:                 Antoinette Haskett

## 2017-03-01 ENCOUNTER — Encounter (HOSPITAL_COMMUNITY): Payer: Self-pay

## 2017-03-01 MED ORDER — BUPRENORPHINE HCL 8 MG SL SUBL
8.0000 mg | SUBLINGUAL_TABLET | Freq: Three times a day (TID) | SUBLINGUAL | Status: DC
  Administered 2017-03-01 – 2017-03-02 (×4): 8 mg via SUBLINGUAL
  Filled 2017-03-01 (×4): qty 1

## 2017-03-01 MED ORDER — BUPRENORPHINE HCL 8 MG SL SUBL
8.0000 mg | SUBLINGUAL_TABLET | Freq: Once | SUBLINGUAL | Status: AC
  Administered 2017-03-01: 8 mg via SUBLINGUAL
  Filled 2017-03-01: qty 1

## 2017-03-01 MED ORDER — MEDROXYPROGESTERONE ACETATE 150 MG/ML IM SUSP
150.0000 mg | Freq: Once | INTRAMUSCULAR | Status: AC
  Administered 2017-03-01: 150 mg via INTRAMUSCULAR
  Filled 2017-03-01: qty 1

## 2017-03-01 NOTE — Progress Notes (Signed)
Per patient, day shift RN gave her her 1800 dose of ibuprofen around 1915 after we did bedside report but forgot to scan it.

## 2017-03-01 NOTE — Clinical Social Work Maternal (Signed)
  CLINICAL SOCIAL WORK MATERNAL/CHILD NOTE  Patient Details  Name: [name] MRN: [id number] Date of Birth: [birth year]  Date:  03/01/2017  Clinical Social Worker Initiating Note:  Laurey Arrow Date/ Time Initiated:  03/01/17/1508     Child's Name:  [name]   Legal Guardian:  Mother (FOB is [name] 02/21/17)   Need for Interpreter:  None   Date of Referral:  03/01/17     Reason for Referral:  Current Substance Use/Substance Use During Pregnancy  (hx of substance abuse.)   Referral Source:  Central Nursery   Address:  [address]. [place] [address]  Phone number:  [phone]   Household Members:  Self, Minor Children, Parents   Natural Supports (not living in the home):  Immediate Family, Spouse/significant other   Professional Supports: Therapist ([facility] does medication management for Subutex and outpatient SA counseling.)   Employment: Unemployed   Type of Work:     Education:  9 to 11 years   Museum/gallery curator Resources:  [employer]   Other Resources:  [employer], Physicist, medical    Cultural/Religious Considerations Which May Impact Care:  Per [employer], MOB attend a [employer].   Strengths:  Ability to meet basic needs , Pediatrician chosen , Home prepared for child    Risk Factors/Current Problems:  Substance Use , DHHS Involvement    Cognitive State:  Able to Concentrate , Alert , Insightful , Linear Thinking    Mood/Affect:  Happy , Calm , Comfortable    CSW Assessment: CSW met with MOB to complete an assessment for hx of SA. When CSW arrived, MOB was sitting on the couch attaching and bonding with infant.  MOB was engaging, polite, and interested in meeting with CSW.  CSW inquired about MOB's SA hx and MOB acknowledged a hx of opiate use/abuse.  MOB reported MOB overdosed in 2015 and April 2017.  MOB communicated the MOB is currently prescribed Subutex and medication is being managed by [facility]. MOB  reports MOB last use of heroin was April 2017, and last use of marijuana was 3 days ago.  CSW thanked MOB for being honest, and informed MOB that the infant's UDS was negative and CSW will monitor CDS. CSW made MOB that if infant CDS is positive without an explanation, CSW will make a report to [facility] CPS. MOB was understanding and did not have any questions or concerns.  MOB acknowledged a hx of CPS involvement and reported that MOB currently does not have custody of MOB's older 3 children [name] 09/02/09, [name] 4//09/2012, and [name] 12/31/13). CSW informed MOB that CSW is going to make a report to [facility] CPS due to MOB's CPS hx and MOB not having custody of MOB's older children; MOB was understanding.  MOB reports having all necessary items for infant and feels prepared to parent infant.  CSW thanked MOB for meeting with CSW and provided MOB with CSW contact information.   CSW made a [facility] CPS report with intake worker, [name].   At this time there are barriers to d/c.  CPS will follow-up with CSW prior to infant's d/c.   CSW Plan/Description:  Information/Referral to [employer] , Engineer, mining , Child Copy Report  (CSW will monitor infant's CDS and will update CPS of the results. )   Laurey Arrow, MSW, LCSW Clinical Social Work [phone]  Dimple Nanas, LCSW 03/01/2017, 3:15 PM

## 2017-03-01 NOTE — Discharge Summary (Signed)
OB Discharge Summary     Patient Name: [name] DOB: Jan 08, 1991 MRN: [id number]  Date of admission: 02/28/2017 Delivering MD: Maryjo Rochester L   Date of discharge: 03/02/2017  Admitting diagnosis: 38 WEEKS CTX Intrauterine pregnancy: [redacted]w[redacted]d     Secondary diagnosis:  Active Problems:   Maternal substance abuse affecting newborn   NSVD (normal spontaneous vaginal delivery)  Additional problems: Subutex use, positive for Hep C, history of opiate overdose and cardiac arrest in 2015     Discharge diagnosis: Term Pregnancy Delivered                                                                                                Post partum procedures:none  Augmentation: Pitocin  Complications: None  Hospital course:  Onset of Labor With Vaginal Delivery     26 y.o. yo [id number] at [redacted]w[redacted]d was admitted in Latent Labor on 02/28/2017. Patient had an uncomplicated labor course as follows:  Membrane Rupture Time/Date: 8:00 PM ,02/27/2017   Intrapartum Procedures: Episiotomy: None [1]                                         Lacerations:  None [1]  Patient had a delivery of a Viable infant. 02/28/2017  Information for the patient's newborn:  Yetunde, Leis [[id number]]  Delivery Method: Vag-Spont    Pateint had an uncomplicated postpartum course.  She is ambulating, tolerating a regular diet, passing flatus, and urinating well. Patient is discharged home in stable condition on 03/02/17 , but will be doing a maternal stayover with the baby until Monday due to baby is being watched for NAS. CPS has been contacted due to polysubstance use during pregnancy and on subutex.   Physical exam  Vitals:   02/28/17 1045 03/01/17 0500 03/01/17 1828 03/02/17 0600  BP: 108/77 120/69 112/78 117/68  Pulse: 96 88 90 77  Resp: Temp: 98.5 F (36.9 C) 98.5 F (36.9 C) 97.6 F (36.4 C) 98.1 F (36.7 C)  TempSrc: Oral Oral Oral Oral  SpO2:   97%   Weight:      Height:       General:  alert, cooperative and no distress Lochia: appropriate Uterine Fundus: firm Incision: N/A DVT Evaluation: No evidence of DVT seen on physical exam. Labs: Lab Results  Component Value Date   WBC 13.2 (H) 02/28/2017   HGB 11.0 (L) 02/28/2017   HCT 33.7 (L) 02/28/2017   MCV 85.5 02/28/2017   PLT 331 02/28/2017   CMP Latest Ref Rng & Units 08/11/2014  Glucose 70 - 99 mg/dL 91  BUN 6 - 23 mg/dL 10  Creatinine 4.03 - 4.74 mg/dL 2.59  Sodium 563 - 875 mEq/L 137  Potassium 3.5 - 5.3 mEq/L 4.8  Chloride 96 - 112 mEq/L 100  CO2 19 - 32 mEq/L 21  Calcium 8.4 - 10.5 mg/dL 9.5  Total Protein 6.0 - 8.3 g/dL 8.3  Total Bilirubin 0.2 - 1.2 mg/dL 0.7  Alkaline  Phos 39 - 117 U/L 125(H)  AST 0 - 37 U/L 470(H)  ALT 0 - 35 U/L 1,013(H)    Discharge instruction: per After Visit Summary and "Baby and Me Booklet".  After visit meds:  Allergies as of 03/02/2017   No Known Allergies     Medication List    TAKE these medications   albuterol 108 (90 Base) MCG/ACT inhaler Commonly known as:  PROVENTIL HFA;VENTOLIN HFA Inhale 2 puffs into the lungs every 6 (six) hours as needed for wheezing or shortness of breath.   buprenorphine 8 MG Subl SL tablet Commonly known as:  SUBUTEX Place 8 mg under the tongue 3 (three) times daily.   calcium carbonate 500 MG chewable tablet Commonly known as:  TUMS - dosed in mg elemental calcium Chew 1-2 tablets by mouth daily.   docusate sodium 100 MG capsule Commonly known as:  COLACE Take 1 capsule (100 mg total) by mouth 2 (two) times daily.   ibuprofen 600 MG tablet Commonly known as:  ADVIL,MOTRIN Take 1 tablet (600 mg total) by mouth every 6 (six) hours.   prenatal multivitamin Tabs tablet Take 1 tablet by mouth daily at 12 noon.   ranitidine 150 MG tablet Commonly known as:  ZANTAC Take 1 tablet (150 mg total) by mouth 2 (two) times daily.       Diet: routine diet  Activity: Advance as tolerated. Pelvic rest for 6 weeks.   Outpatient  follow up:6 weeks Follow up Appt:No future appointments. Follow up Visit:No Follow-up on file.  Postpartum contraception: Depo Provera  Newborn Data: Live born female  Birth Weight: 6 lb 10.2 oz (3011 g) APGAR: 9, 9  Baby Feeding: Bottle Disposition:Maternal Stayover, CPS involved for polysubstance use, keeping baby for monitoring for NAS until Monday.   03/02/2017 [name], MD   OB FELLOW DISCHARGE ATTESTATION  I have seen and examined this patient and agree with above documentation in the resident's note. Staying rooming in with baby until Monday, CPS/SW involved due to polysubstance use.   Jen Mow, DO OB Fellow 3:22 PM

## 2017-03-01 NOTE — Progress Notes (Signed)
Post Partum Day #1 Subjective: no complaints, up ad lib, voiding, tolerating PO and reports normal lochia  Objective: Blood pressure 120/69, pulse 88, temperature 98.5 F (36.9 C), temperature source Oral, resp. rate 18, height  (1.651 m), weight 74.8 kg (165 lb), last menstrual period 05/22/2016, SpO2 100 %, unknown if currently breastfeeding.  Physical Exam:  General: alert Lochia: appropriate Uterine Fundus: firm and NT at U-2 DVT Evaluation: No evidence of DVT seen on physical exam.   Recent Labs  02/28/17 0245  HGB 11.0*  HCT 33.7*    Assessment/Plan: Plan for discharge tomorrow   LOS: 1 day   Allie Bossier 03/01/2017, 6:56 AM

## 2017-03-02 MED ORDER — IBUPROFEN 600 MG PO TABS: 600.0000 mg | ORAL_TABLET | Freq: Four times a day (QID) | ORAL | 0 refills | Status: DC

## 2017-03-02 MED ORDER — DOCUSATE SODIUM 100 MG PO CAPS: 100.0000 mg | ORAL_CAPSULE | Freq: Two times a day (BID) | ORAL | 0 refills | Status: DC

## 2017-03-02 MED ORDER — PRENATAL MULTIVITAMIN CH: 1.0000 | ORAL_TABLET | Freq: Every day | ORAL | 0 refills | Status: DC

## 2017-03-02 NOTE — Discharge Instructions (Signed)

## 2017-03-04 ENCOUNTER — Ambulatory Visit: Payer: Self-pay

## 2017-03-04 NOTE — Lactation Note (Signed)
This note was copied from a baby's chart. Lactation Consultation Note  IBCLC called to pt room per maternal request. River was latched to the right breast and appeared to be swallowing. She fell off after 10 minutes and was asleep but quickly became ravenous.  River would not attach to the left breast so she was given formula. Encouraged mother to always attempt to BF, offer formula if River is not satisfied and pump to prevent engorgement/ increase supply. During the consult mother reported that initially she wanted to BF because her breasts were full but now she thinks she may continue BF.  Asked RN to give mother a hand pump for now as unsure of level of commitment. Follow-up tomorrow if mother requests consult.  Patient Name: [name] [id number] Date: 03/04/2017 Reason for consult: Initial assessment   Maternal Data Has patient been taught Hand Expression?: Yes Does the patient have breastfeeding experience prior to this delivery?: No  Feeding Feeding Type: Breast Fed Nipple Type: Slow - flow Length of feed: 10 min  LATCH Score/Interventions Latch: Repeated attempts needed to sustain latch, nipple held in mouth throughout feeding, stimulation needed to elicit sucking reflex.  Audible Swallowing: A few with stimulation  Type of Nipple: Everted at rest and after stimulation  Comfort (Breast/Nipple): Soft / non-tender     Hold (Positioning): Assistance needed to correctly position infant at breast and maintain latch.  LATCH Score: 7  Lactation Tools Discussed/Used     Consult Status Consult Status: PRN    Soyla Dryer 03/04/2017, 9:23 PM

## 2017-03-04 NOTE — Lactation Note (Signed)
This note was copied from a baby's chart. Lactation Consultation Note Attempted consult on mother who expressed an interest in trying BF.  SHe was not in her room.  RN to inform IBCLC if mother desires consult.  Patient Name: [name] [id number] Date: 03/04/2017     Maternal Data    Feeding Feeding Type: Formula Nipple Type: Slow - flow  LATCH Score/Interventions                      Lactation Tools Discussed/Used     Consult Status      Soyla Dryer 03/04/2017, 8:50 PM

## 2017-03-05 ENCOUNTER — Ambulatory Visit: Payer: Self-pay

## 2017-03-05 NOTE — Lactation Note (Signed)
This note was copied from a baby's chart. Lactation Consultation Note  Patient Name: [name] [id number] Date: 03/05/2017 Reason for consult: Follow-up assessment (NAS scoring / breast / formula. LC updated doc flow sheets )  Baby is 24 days old, breast and bottle feeding formula since yesterday. Breast fed x 8 since yesterday, and supplemented with 30 - 60 ml after breast feeding or in place of breast feeding .  Per mom breast are not engorged and not sore.  Sore nipple and engorgement prevention and tx reviewed.  LC instructed mom on the use of a hand pump . #24 Flange comfortable.      Maternal Data    Feeding Feeding Type: Bottle Fed - Formula Nipple Type: Slow - flow Length of feed: 30 min (per mom )  LATCH Score/Interventions                Intervention(s): Breastfeeding basics reviewed     Lactation Tools Discussed/Used Tools: Pump Breast pump type: Manual   Consult Status Consult Status: Follow-up Date: 03/06/17 Follow-up type: In-patient    [name] 03/05/2017, 12:29 PM

## 2017-03-31 ENCOUNTER — Encounter (HOSPITAL_COMMUNITY): Payer: Self-pay

## 2017-03-31 ENCOUNTER — Emergency Department (HOSPITAL_COMMUNITY): Payer: Medicaid Other

## 2017-03-31 ENCOUNTER — Emergency Department (HOSPITAL_COMMUNITY)
Admission: EM | Admit: 2017-03-31 | Discharge: 2017-03-31 | Disposition: A | Discharge status: Home / Self Care | Payer: Medicaid Other | Attending: Emergency Medicine | Admitting: Emergency Medicine

## 2017-03-31 DIAGNOSIS — Y999 Unspecified external cause status: Secondary | ICD-10-CM | POA: Insufficient documentation

## 2017-03-31 DIAGNOSIS — Z79899 Other long term (current) drug therapy: Secondary | ICD-10-CM | POA: Insufficient documentation

## 2017-03-31 DIAGNOSIS — Z87891 Personal history of nicotine dependence: Secondary | ICD-10-CM | POA: Insufficient documentation

## 2017-03-31 DIAGNOSIS — Y9301 Activity, walking, marching and hiking: Secondary | ICD-10-CM | POA: Insufficient documentation

## 2017-03-31 DIAGNOSIS — Y92008 Other place in unspecified non-institutional (private) residence as the place of occurrence of the external cause: Secondary | ICD-10-CM | POA: Diagnosis not present

## 2017-03-31 DIAGNOSIS — S52251A Displaced comminuted fracture of shaft of ulna, right arm, initial encounter for closed fracture: Secondary | ICD-10-CM | POA: Diagnosis not present

## 2017-03-31 DIAGNOSIS — S6991XA Unspecified injury of right wrist, hand and finger(s), initial encounter: Secondary | ICD-10-CM | POA: Diagnosis present

## 2017-03-31 DIAGNOSIS — W01198A Fall on same level from slipping, tripping and stumbling with subsequent striking against other object, initial encounter: Secondary | ICD-10-CM | POA: Diagnosis not present

## 2017-03-31 DIAGNOSIS — M79601 Pain in right arm: Secondary | ICD-10-CM

## 2017-03-31 HISTORY — DX: Unspecified viral hepatitis C without hepatic coma: B19.20

## 2017-03-31 MED ORDER — ACETAMINOPHEN 500 MG PO TABS
1000.0000 mg | ORAL_TABLET | Freq: Once | ORAL | Status: AC
  Administered 2017-03-31: 1000 mg via ORAL
  Filled 2017-03-31: qty 2

## 2017-03-31 NOTE — ED Notes (Signed)
Bed: WA07 Expected date:  Expected time:  Means of arrival:  Comments: Level 4

## 2017-03-31 NOTE — ED Triage Notes (Signed)
Larey SeatFell this am about 0700 am and now right mid forearm pain with swelling noted good csm noted no deformity noted no other injuries.

## 2017-03-31 NOTE — ED Provider Notes (Signed)
[facility]-EMERGENCY DEPT Provider Note   CSN: [id number] Arrival date & time: 03/31/17  1954  By signing my name below, I, [name], attest that this documentation has been prepared under the direction and in the presence of [name], PA-C. Electronically Signed: Marnette Burgess Cruz, Scribe. 03/31/2017. 9:40 PM.  History   Chief Complaint Chief Complaint  Patient presents with  . Extremity Pain   The history is provided by the patient and medical records. No language interpreter was used.    HPI Comments:  [name] is a 26 y.o. female with a PMHx of Hepatitis C, who presents to the Emergency Department complaining of sudden onset, throbbing, 6/10, right arm pain s/p a mechanical fall at 0700 this morning. Pt reports walking around her house this morning around 0700, when she tripped and fell forward, striking her right arm on the ground. Denies LOC or Head injury. She has some associated swelling to the area. No prior injury to the right arm or pain noted anywhere else. Movement and palpation exacerbate her pain. She tried Tylenol at home with minimal relief of the arm pain. Pt denies HA, numbness, fever, and any other complaints at this time.   Past Medical History:  Diagnosis Date  . Bacterial infection   . Family history of varicose veins   . FHx: hypertension   . FHx: scoliosis   . FHx: thyroid disease   . H/O varicella   . Heart murmur   . Hepatitis    hepatitis C- 2015, confirmed this year  . Hepatitis C   . Hx: UTI (urinary tract infection) 2012  . No pertinent past medical history   . Postpartum eclampsia 08/2006   Patient Active Problem List   Diagnosis Date Noted  . NSVD (normal spontaneous vaginal delivery) 03/01/2017  . 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 Overdose of opiate or related narcotic/unintentional 08/06/2014  . Hepatitis C virus infection without hepatic coma 08/06/2014  . Cardiac arrest (HCC) 08/01/2014  . Supervision of  high-risk pregnancy 12/31/2013  . Insufficient prenatal care in third trimester 12/15/2013   Past Surgical History:  Procedure Laterality Date  . NO PAST SURGERIES      OB History    Gravida Para Term Preterm AB Living   5 3 3  0 1 3   SAB TAB Ectopic Multiple Live Births   0 1 0 0 3      Obstetric Comments   Hx of preeclampsia     Home Medications    Prior to Admission medications   Medication Sig Start Date End Date Taking? Authorizing Provider  ibuprofen (ADVIL,MOTRIN) 200 MG tablet Take 800 mg by mouth every 6 (six) hours as needed (pain).   Yes [provider]  SUBOXONE 8-2 MG FILM Take 1 Film by mouth 2 (two) times daily. 03/28/17  Yes [provider]  albuterol (PROVENTIL HFA;VENTOLIN HFA) 108 (90 Base) MCG/ACT inhaler Inhale 2 puffs into the lungs every 6 (six) hours as needed for wheezing or shortness of breath. Patient not taking: Reported on 03/31/2017 11/21/16   [name], MD  docusate sodium (COLACE) 100 MG capsule Take 1 capsule (100 mg total) by mouth 2 (two) times daily. Patient not taking: Reported on 03/31/2017 03/02/17   Mumaw, Hiram Comber, DO  ibuprofen (ADVIL,MOTRIN) 600 MG tablet Take 1 tablet (600 mg total) by mouth every 6 (six) hours. Patient not taking: Reported on 03/31/2017 03/02/17   [name], DO  Prenatal Vit-Fe  Fumarate-FA (PRENATAL MULTIVITAMIN) TABS tablet Take 1 tablet by mouth daily at 12 noon. Patient not taking: Reported on 03/31/2017 03/02/17   [name], MD  ranitidine (ZANTAC) 150 MG tablet Take 1 tablet (150 mg total) by mouth 2 (two) times daily. Patient not taking: Reported on 03/31/2017 11/21/16   [name], MD   Family History Family History  Problem Relation Age of Onset  . Anemia Mother   . Depression Mother   . Hypertension Father   . Heart disease Maternal Grandmother   . Depression Maternal Grandmother   . Heart disease Paternal Grandfather   . Anesthesia problems Neg Hx     Social History Social History  Substance Use Topics  . Smoking status: Former Smoker    Packs/day: 0.25    Types: Cigarettes    Quit date: 08/23/2016  . Smokeless tobacco: Never Used     Comment: up to half pack/day  . Alcohol use No     Comment: none with pregnancy   Allergies   Patient has no known allergies.   Review of Systems Review of Systems  Constitutional: Negative for fever.  Musculoskeletal: Positive for arthralgias and myalgias.  Neurological: Negative for numbness and headaches.  All other systems reviewed and are negative.    Physical Exam Updated Vital Signs BP 110/88   Pulse 85   Temp 98.5 F (36.9 C) (Oral)   Resp 20   Ht 5\' 5"  (1.651 m)   Wt 66.2 kg   SpO2 99%   BMI 24.30 kg/m   Physical Exam  Constitutional: She appears well-developed and well-nourished. No distress.  HENT:  Head: Normocephalic and atraumatic.  Eyes: Conjunctivae and EOM are normal. No scleral icterus.  Neck: Normal range of motion.  Cardiovascular: Normal rate, regular rhythm, normal heart sounds and intact distal pulses.  Exam reveals no gallop and no friction rub.   No murmur heard. Pulmonary/Chest: Effort normal and breath sounds normal. No respiratory distress. She has no wheezes. She has no rales. She exhibits no tenderness.  Musculoskeletal: She exhibits tenderness.       Arms: Neurological: She is alert. No sensory deficit.  Right arm appears neurovascularly intact.  Skin: No rash noted. She is not diaphoretic.  Psychiatric: She has a normal mood and affect.  Nursing note and vitals reviewed.  ED Treatments / Results  DIAGNOSTIC STUDIES:  Oxygen Saturation is 99% on RA, normal by my interpretation.    COORDINATION OF CARE:  9:40 PM Discussed treatment plan with pt at bedside including right forearm XR with pain medication and a cast and pt agreed to plan.  Labs (all labs ordered are listed, but only abnormal results are displayed) Labs Reviewed - No data  to display  EKG  EKG Interpretation None       Radiology Dg Forearm Right  Result Date: 03/31/2017 CLINICAL DATA:  Initial evaluation for acute trauma, fall. EXAM: RIGHT FOREARM - 2 VIEW COMPARISON:  None. FINDINGS: There is an acute oblique comminuted fracture through the midshaft of the right ulna. Mild radial displacement. Radius intact. No other acute fracture. No acute soft tissue abnormality. IMPRESSION: Acute oblique comminuted fracture through the midshaft of the right ulna. Electronically Signed   By: Rise Mu M.D.   On: 03/31/2017 20:44    Procedures Procedures (including critical care time)  Medications Ordered in ED Medications  acetaminophen (TYLENOL) tablet 1,000 mg (1,000 mg Oral Given 03/31/17 2217)     Initial Impression / Assessment and Plan /  ED Course  I have reviewed the triage vital signs and the nursing notes.  Pertinent labs & imaging results that were available during my care of the patient were reviewed by me and considered in my medical decision making (see chart for details).     Patient X-Ray impression: Acute oblique comminuted fracture through the midshaft of the right ulna. Pt advised to follow up with orthopedics for further imaging in 1 week. Patient given Splint while in ED, encouraged to take Tylenol and ibuprofen as needed for pain control. No need for narcotic pain medication at this time as patient is not complaining of significant pain. Given precautions for splint care. Patient will be discharged home & is agreeable with above plan. Returns precautions discussed. Pt appears safe for discharge.   Final Clinical Impressions(s) / ED Diagnoses   Final diagnoses:  Right arm pain    New Prescriptions Discharge Medication List as of 03/31/2017 10:00 PM      I personally performed the services described in this documentation, which was scribed in my presence. The recorded information has been reviewed and is accurate.      Dietrich PatesKhatri, Yuvraj Pfeifer, PA-C 03/31/17 2307    Gerhard MunchLockwood, Robert, MD 04/02/17 2013

## 2017-03-31 NOTE — ED Notes (Signed)
Pt called to be taken to treatment room with no answer  

## 2017-03-31 NOTE — Discharge Instructions (Signed)
Schedule appointment with orthopedics for further evaluation. Return to ED for worsening pain, additional injury, fevers, numbness or trouble walking.

## 2017-08-27 ENCOUNTER — Other Ambulatory Visit: Payer: Self-pay

## 2017-08-27 DIAGNOSIS — J45909 Unspecified asthma, uncomplicated: Secondary | ICD-10-CM

## 2017-08-27 DIAGNOSIS — O99519 Diseases of the respiratory system complicating pregnancy, unspecified trimester: Principal | ICD-10-CM

## 2017-08-28 ENCOUNTER — Other Ambulatory Visit: Payer: Self-pay

## 2017-08-28 DIAGNOSIS — O99519 Diseases of the respiratory system complicating pregnancy, unspecified trimester: Principal | ICD-10-CM

## 2017-08-28 DIAGNOSIS — J45909 Unspecified asthma, uncomplicated: Secondary | ICD-10-CM

## 2018-11-06 IMAGING — US US MFM OB FOLLOW-UP
1 series · 13 of 28 positions shown · non-contrast
Comparison: none

[Series 1: us mfm ob follow-up · 73 acquisitions, 13 frames shown]
[im 3/73]
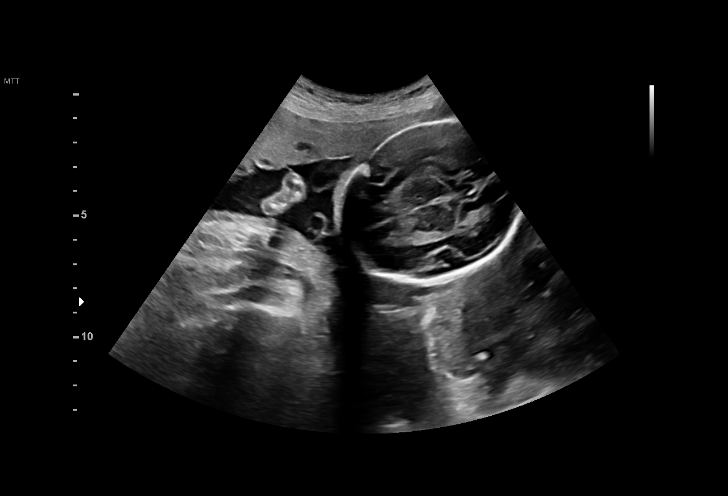
[im 9/73]
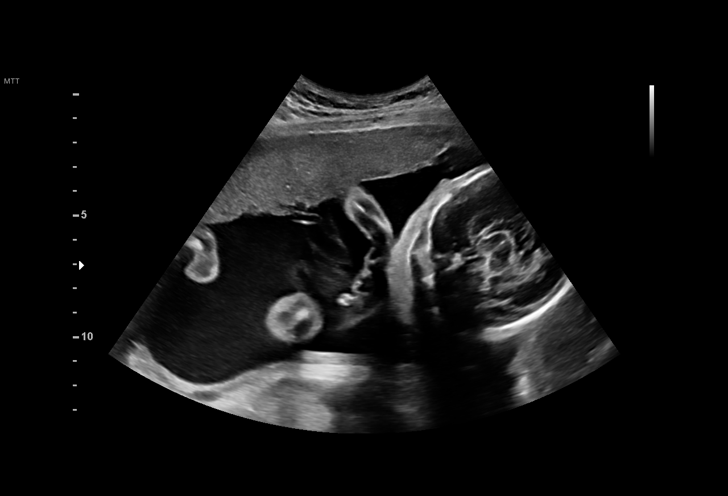
[im 14/73]
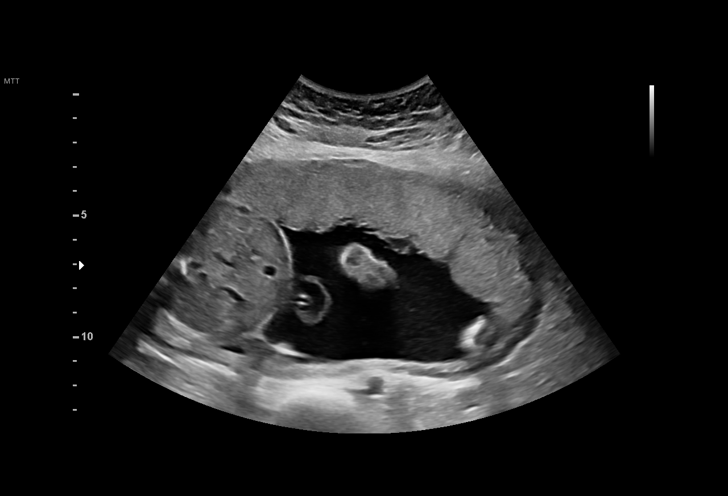
[im 19/73]
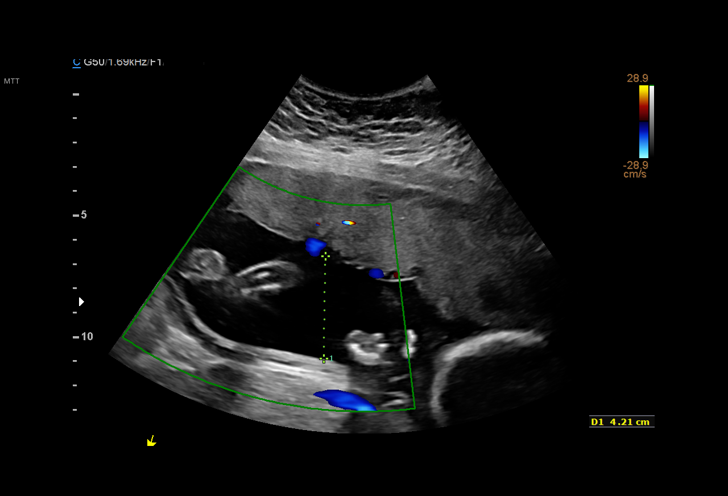
[im 25/73]
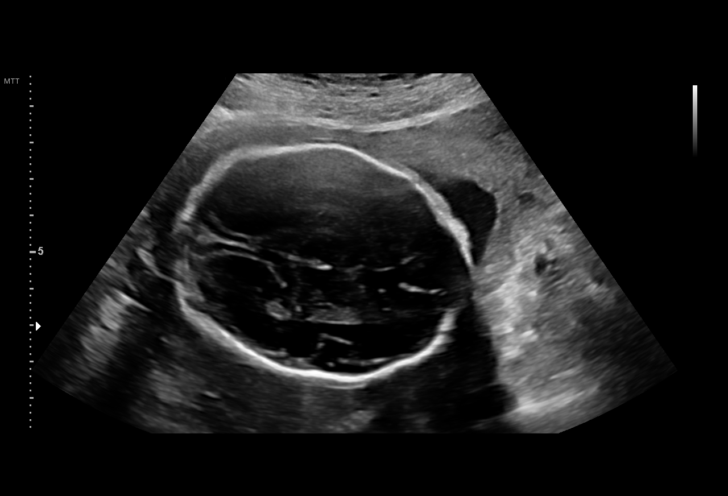
[im 30/73]
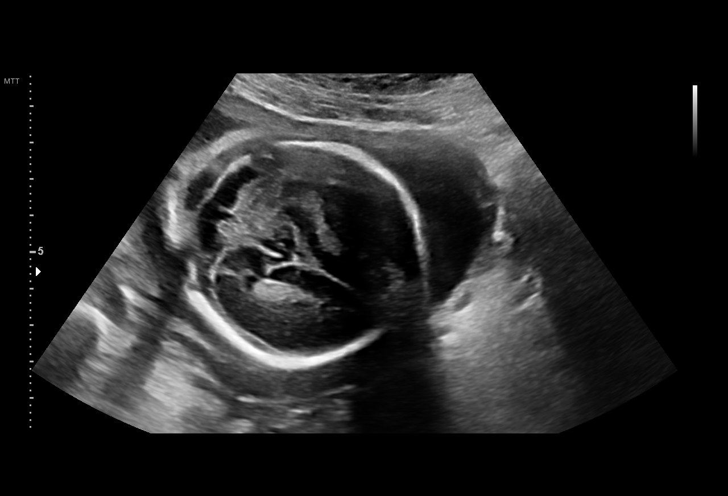
[im 38/73]
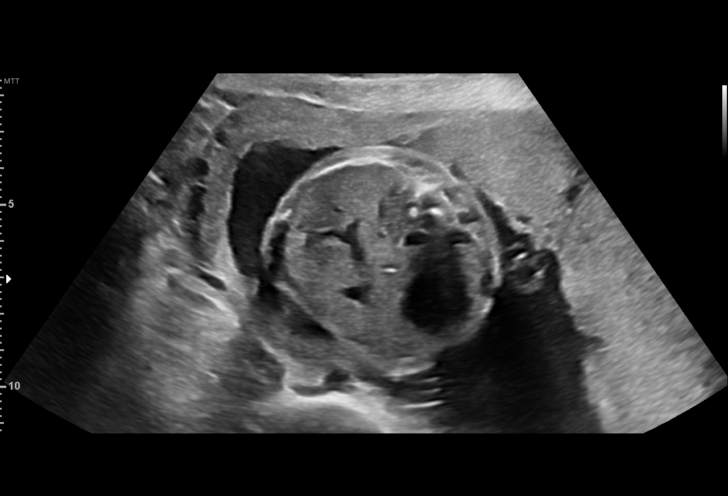
[im 43/73]
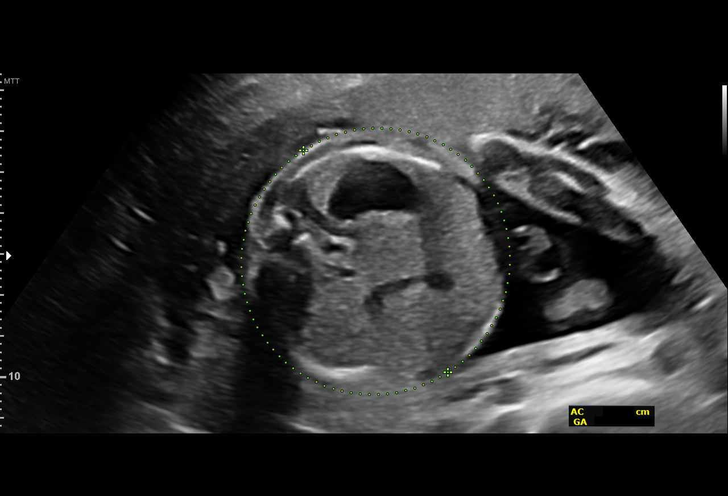
[im 49/73]
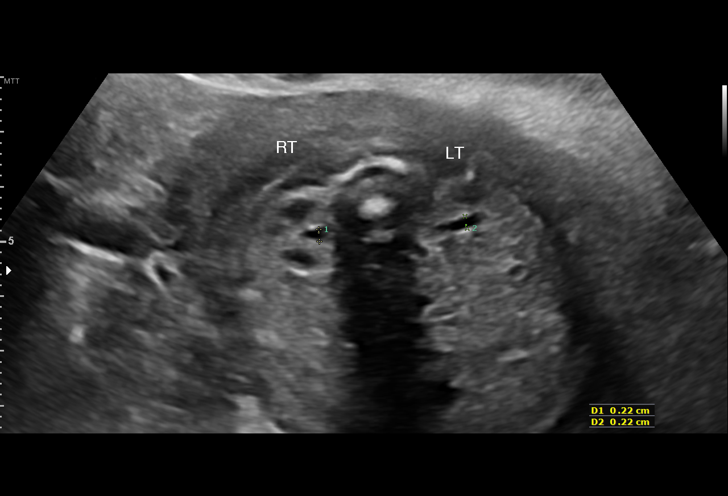
[im 54/73]
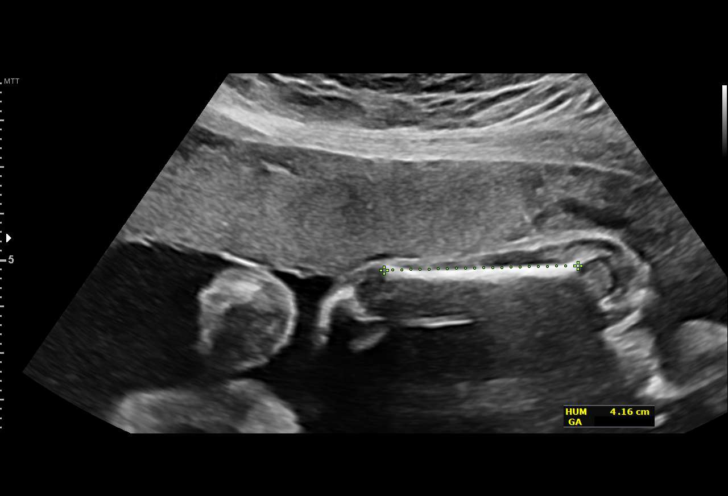
[im 59/73]
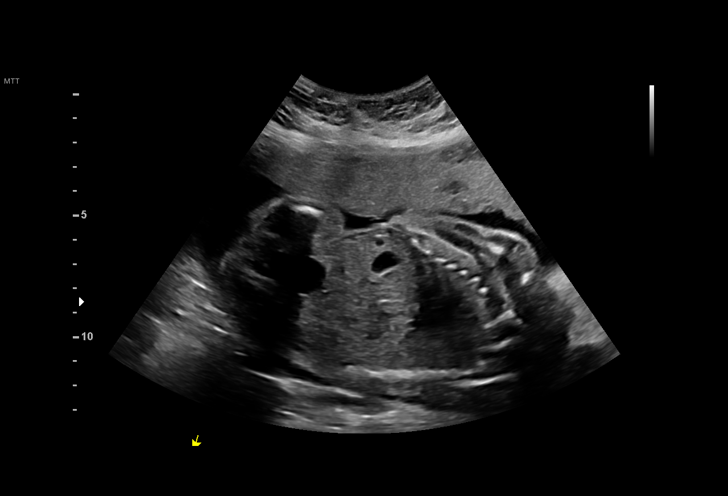
[im 65/73]
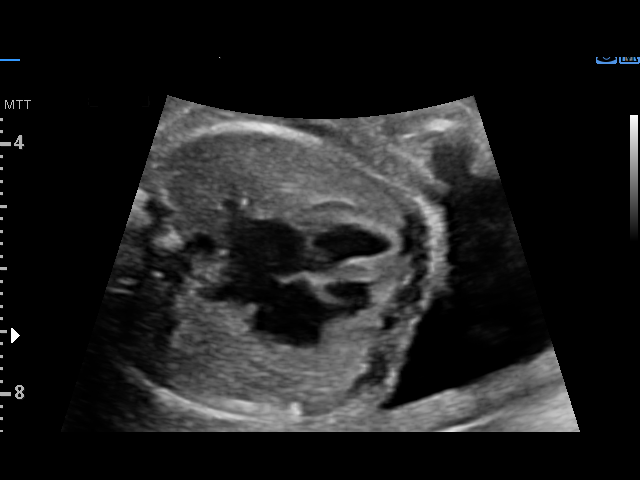
[im 70/73]
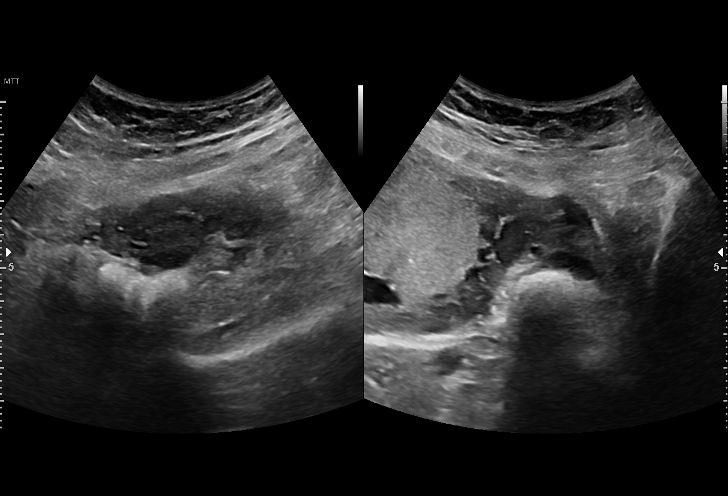

[13 of 28 positions shown; findings below may reference images not displayed]

for [REDACTED]care ([HOSPITAL])

1  REMIGIO GUM            [id number]      [id number]     [id number]
Indications

24 weeks gestation of pregnancy
Chronic Hepatitis C complicating pregnancy,
antepartum
Poor obstetric history: Previous postpartum
eclampsia
Abnormal ultrasound finding on antenatal
screening of mother (thickened NT) low risk
NIPS
Drug use complicating pregnancy, second
trimester (Subutex)
Tobacco use complicating pregnancy,
second trimester
OB History

Blood Type:            Height:  5'5"   Weight (lb):  146      BMI:
Gravidity:    5         Term:   3        Prem:   0        SAB:   0
TOP:          1       Ectopic:  0        Living: 3
Fetal Evaluation

Num Of Fetuses:     1
Fetal Heart         145
Rate(bpm):
Cardiac Activity:   Observed
Presentation:       Cephalic
Placenta:           Anterior, above cervical os
P. Cord Insertion:  Previously Visualized
Amniotic Fluid
AFI FV:      Subjectively within normal limits

Largest Pocket(cm)
4.21
Biometry

BPD:      56.4  mm     G. Age:  23w 2d          7  %    CI:        64.56   %   70 - 86
FL/HC:      19.8   %   18.7 -
HC:      225.7  mm     G. Age:  24w 4d         32  %    HC/AC:      1.10       1.04 -
AC:      204.5  mm     G. Age:  25w 0d         56  %    FL/BPD:     79.3   %   71 - 87
FL:       44.7  mm     G. Age:  24w 5d         43  %    FL/AC:      21.9   %   20 - 24
HUM:      41.5  mm     G. Age:  25w 1d         54  %
CER:      26.7  mm     G. Age:  24w 2d         42  %

Est. FW:     735  gm    1 lb 10 oz      56  %
Gestational Age

LMP:           26w 2d       Date:   05/22/16                 EDD:   02/26/17
U/S Today:     24w 3d                                        EDD:   03/11/17
Best:          24w 4d    Det. By:   U/S C R L  (08/31/16)    EDD:   03/10/17
Anatomy

Cranium:               Appears normal         Aortic Arch:            Appears normal
Cavum:                 Appears normal         Ductal Arch:            Appears normal
Ventricles:            Appears normal         Diaphragm:              Appears normal
Choroid Plexus:        Appears normal         Stomach:                Appears normal, left
sided
Cerebellum:            Appears normal         Abdomen:                Previously seen
Posterior Fossa:       Previously seen        Abdominal Wall:         Previously seen
Nuchal Fold:           Previously seen        Cord Vessels:           Previously seen
Face:                  Orbits and profile     Kidneys:                Appear normal
previously seen
Lips:                  Previously seen        Bladder:                Appears normal
Thoracic:              Previously seen        Spine:                  Previously seen
Heart:                 Appears normal         Upper Extremities:      Previously seen
(4CH, axis, and situs
RVOT:                  Previously seen        Lower Extremities:      Previously seen
LVOT:                  Appears normal

Other:  Female gender previously seen.  Heels and 5th digit previously seen.
Cervix Uterus Adnexa

Cervix
Length:           3.26  cm.
Normal appearance by transabdominal scan.

Uterus
No abnormality visualized.

Left Ovary
Size(cm)     2.41  x    2.05   x  1.95      Vol(ml): 5
Within normal limits. No adnexal mass visualized.

Right Ovary
Size(cm)     2.17  x    2.47   x  2.22      Vol(ml):
Within normal limits. No adnexal mass visualized.
Cul De Sac:   No free fluid seen.

Adnexa:       No abnormality visualized.
Impression

Single living intrauterine pregnancy at 65w5d.
Appropriate interval fetal growth (56%).
Normal amniotic fluid volume.
Normal interval fetal anatomy.
Recommendations

Previous fetal echocardiogram showing tricuspid
insufficiency. Follow-up echocardiogram scheduled tomorrow.
Continue serial growth ultrasounds.

## 2019-01-07 IMAGING — US US MFM OB FOLLOW-UP
1 series · 13 of 28 positions shown · non-contrast
Comparison: none

[Series 1: us mfm ob follow-up · 36 acquisitions, 13 frames shown]
[im 2/36]
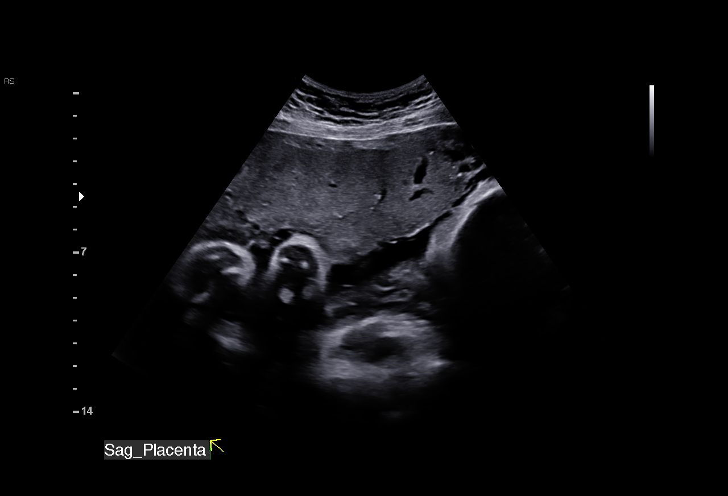
[im 4/36]
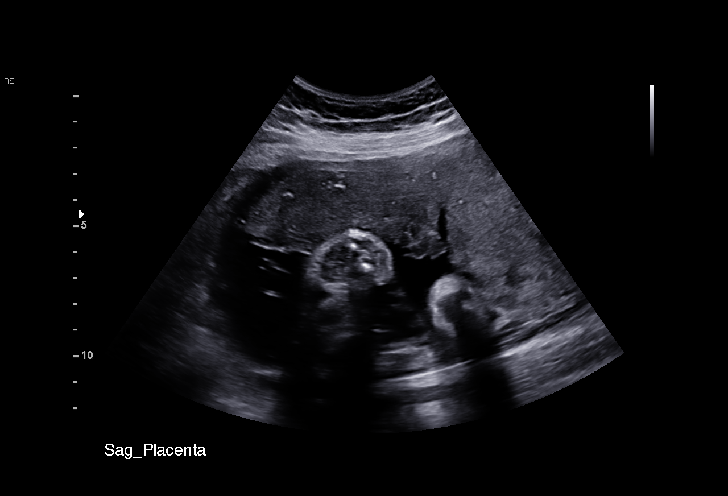
[im 7/36]
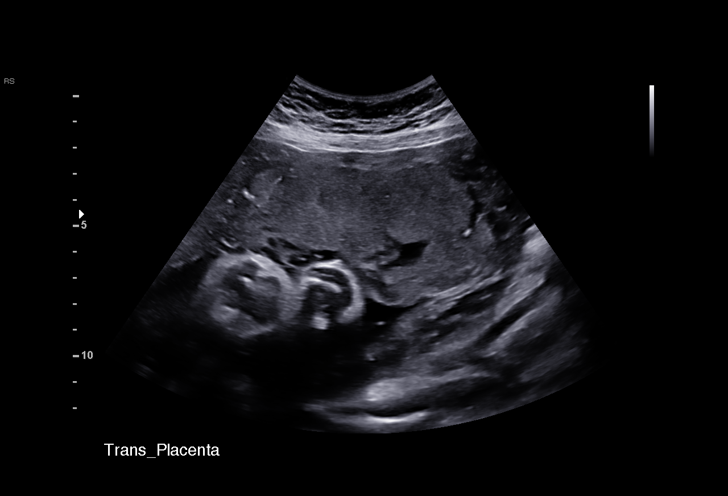
[im 10/36]
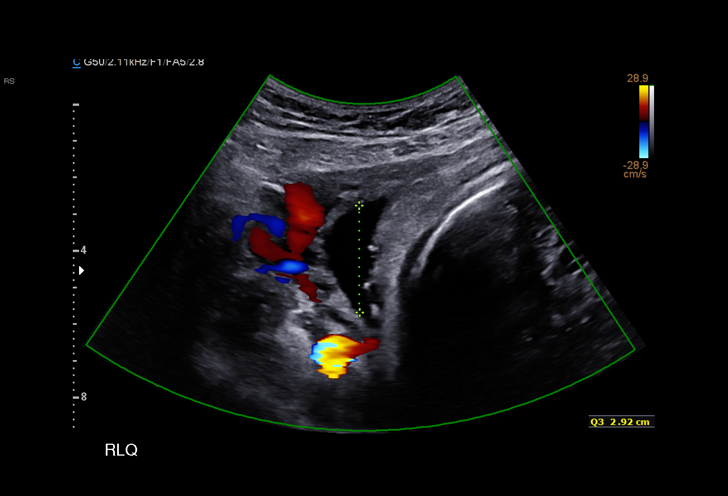
[im 12/36]
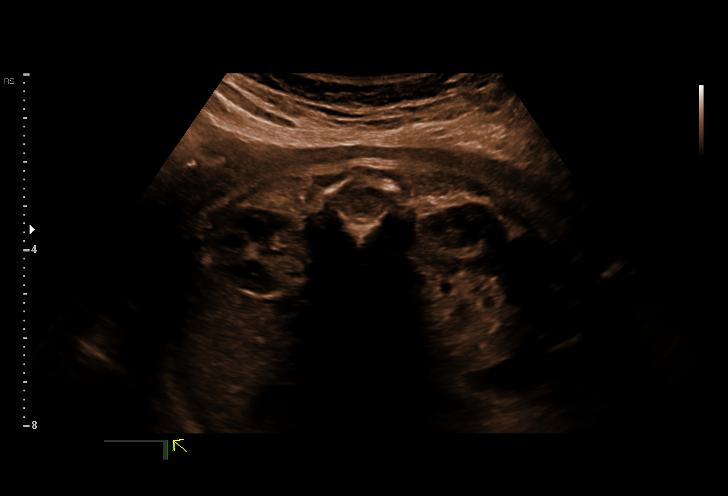
[im 15/36]
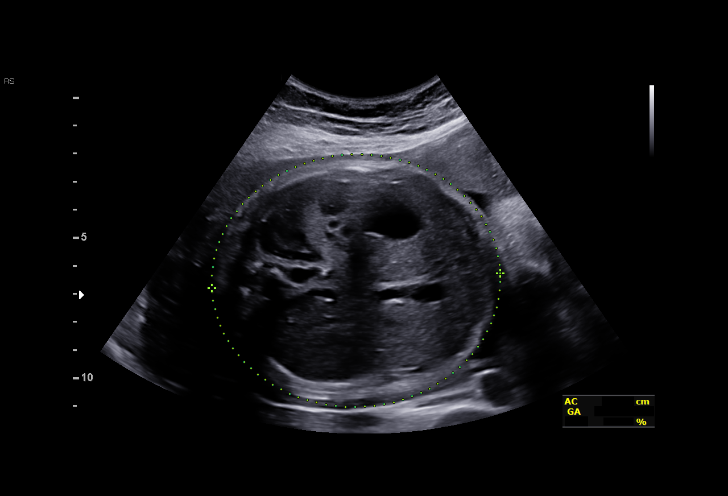
[im 19/36]
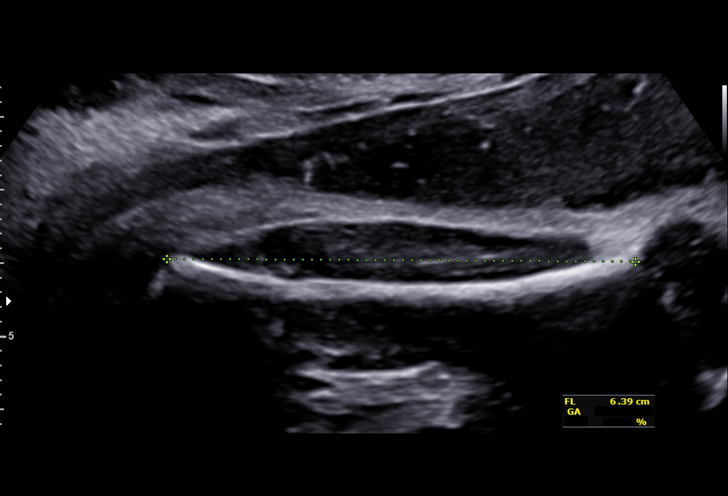
[im 21/36]
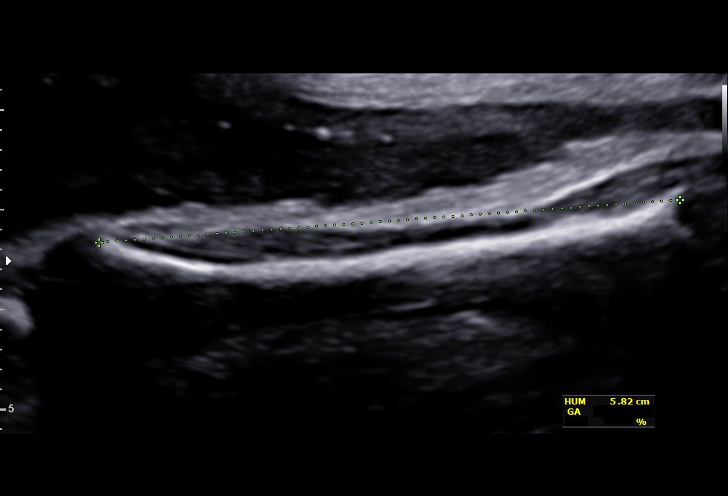
[im 24/36]
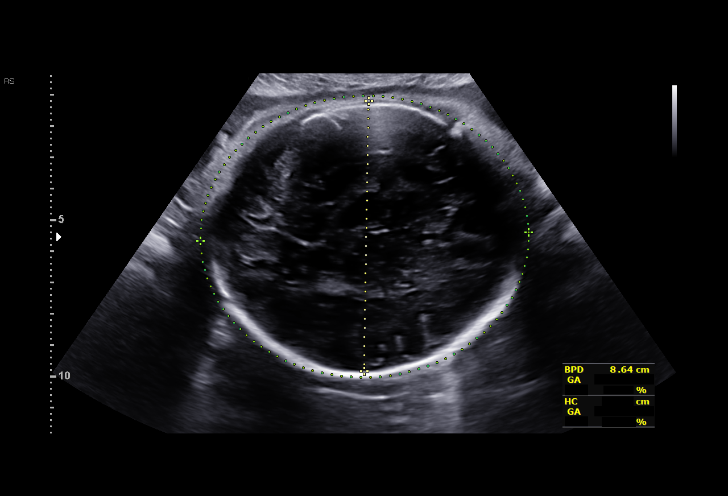
[im 26/36]
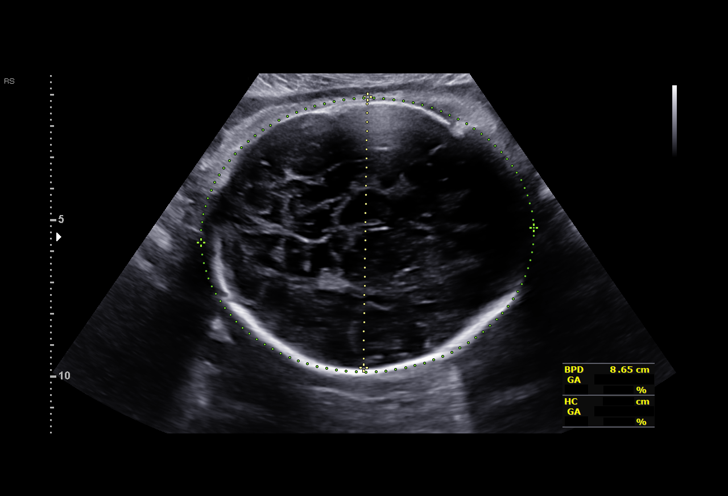
[im 29/36]
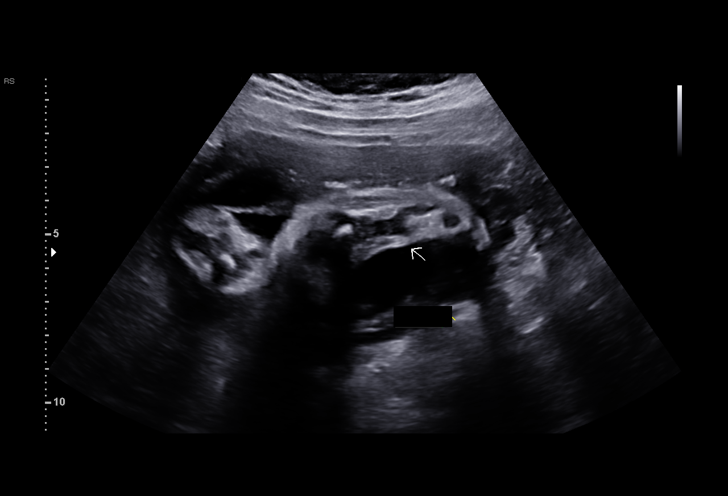
[im 32/36]
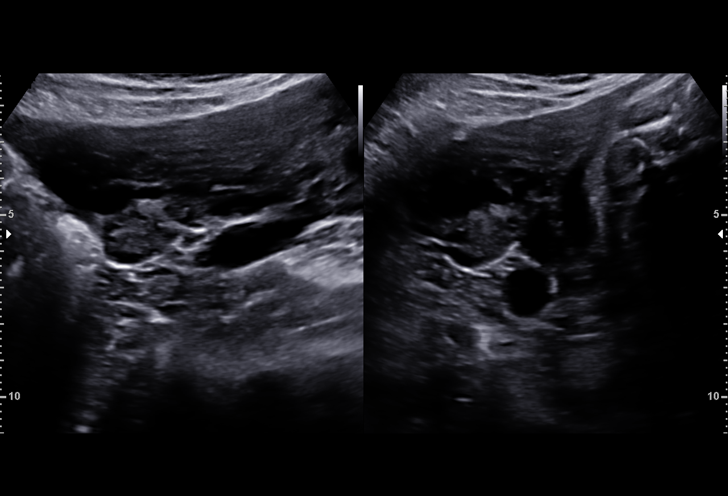
[im 34/36]
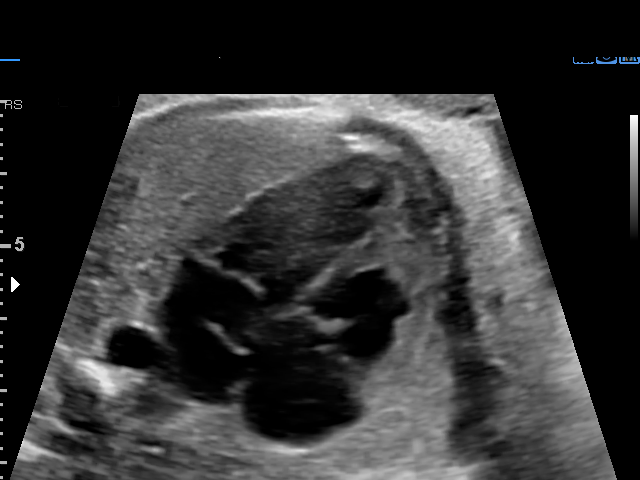

[13 of 28 positions shown; findings below may reference images not displayed]

for [REDACTED]care ([HOSPITAL])

1  ZULPAN MILIATNO              [id number]      [id number]     [id number]
Indications

33 weeks gestation of pregnancy
Chronic Hepatitis C complicating pregnancy,
antepartum
Poor obstetric history: Previous postpartum
eclampsia
Abnormal ultrasound finding on antenatal
screening of mother (thickened NT) low risk
NIPS; fetal ECHO: mild + tricuspid
insuffiiciency
Drug use complicating pregnancy, third
trimester ( Subutex)
Tobacco use complicating pregnancy, third
trimester
OB History

Blood Type:            Height:  5'5"   Weight (lb):  146      BMI:
Gravidity:    5         Term:   3        Prem:   0        SAB:   0
TOP:          1       Ectopic:  0        Living: 3
Fetal Evaluation

Num Of Fetuses:     1
Fetal Heart         143
Rate(bpm):
Cardiac Activity:   Observed
Presentation:       Cephalic
Placenta:           Anterior, above cervical os
P. Cord Insertion:  Previously Visualized
Amniotic Fluid
AFI FV:      Subjectively within normal limits

AFI Sum(cm)     %Tile       Largest Pocket(cm)
12              33

RUQ(cm)       RLQ(cm)       LUQ(cm)        LLQ(cm)
2.87
Biometry

BPD:      86.1  mm     G. Age:  34w 5d         81  %    CI:        78.58   %   70 - 86
FL/HC:      21.1   %   19.9 -
HC:      307.2  mm     G. Age:  34w 2d         34  %    HC/AC:      1.02       0.96 -
AC:      300.4  mm     G. Age:  34w 0d         69  %    FL/BPD:     75.1   %   71 - 87
FL:       64.7  mm     G. Age:  33w 3d         38  %    FL/AC:      21.5   %   20 - 24
HUM:      57.9  mm     G. Age:  33w 4d         62  %

Est. FW:    2684  gm      5 lb 1 oz     69  %
Gestational Age

LMP:           35w 1d       Date:   05/22/16                 EDD:   02/26/17
U/S Today:     34w 1d                                        EDD:   03/05/17
Best:          33w 3d    Det. By:   U/S C R L  (08/31/16)    EDD:   03/10/17
Anatomy

Cranium:               Appears normal         Aortic Arch:            Previously seen
Cavum:                 Previously seen        Ductal Arch:            Previously seen
Ventricles:            Previously seen        Diaphragm:              Previously seen
Choroid Plexus:        Previously seen        Stomach:                Appears normal, left
sided
Cerebellum:            Previously seen        Abdomen:                Appears normal
Posterior Fossa:       Previously seen        Abdominal Wall:         Previously seen
Nuchal Fold:           Not applicable (>20    Cord Vessels:           Previously seen
wks GA)
Face:                  Orbits and profile     Kidneys:                Appear normal
previously seen
Lips:                  Previously seen        Bladder:                Appears normal
Thoracic:              Appears normal         Spine:                  Previously seen
Heart:                 Previously seen        Upper Extremities:      Previously seen
RVOT:                  Previously seen        Lower Extremities:      Previously seen
LVOT:                  Previously seen

Other:  Female gender previously seen.  Heels and 5th digit previously seen.
Technically difficult due to fetal position.
Cervix Uterus Adnexa

Cervix
Not visualized (advanced GA >30wks)

Uterus
No abnormality visualized.

Left Ovary
Not visualized.
Right Ovary
Within normal limits.

Adnexa:       No abnormality visualized. No adnexal mass
visualized.
Impression

SIUP at 33+3 weeks
Normal interval anatomy; anatomic survey complete
Normal amniotic fluid volume
Appropriate interval growth with EFW at the 69th %tile
Recommendations

Keep previously scheduled appt for EFW in 3 weeks

## 2019-03-16 ENCOUNTER — Encounter (HOSPITAL_BASED_OUTPATIENT_CLINIC_OR_DEPARTMENT_OTHER): Payer: Self-pay | Admitting: *Deleted

## 2019-03-16 ENCOUNTER — Emergency Department (HOSPITAL_BASED_OUTPATIENT_CLINIC_OR_DEPARTMENT_OTHER)
Admission: EM | Admit: 2019-03-16 | Discharge: 2019-03-16 | Disposition: A | Discharge status: Home / Self Care | Payer: Self-pay | Attending: Emergency Medicine | Admitting: Emergency Medicine

## 2019-03-16 ENCOUNTER — Other Ambulatory Visit: Payer: Self-pay

## 2019-03-16 DIAGNOSIS — L0291 Cutaneous abscess, unspecified: Secondary | ICD-10-CM

## 2019-03-16 DIAGNOSIS — Z8674 Personal history of sudden cardiac arrest: Secondary | ICD-10-CM | POA: Insufficient documentation

## 2019-03-16 DIAGNOSIS — Z79899 Other long term (current) drug therapy: Secondary | ICD-10-CM | POA: Insufficient documentation

## 2019-03-16 DIAGNOSIS — L02415 Cutaneous abscess of right lower limb: Secondary | ICD-10-CM | POA: Insufficient documentation

## 2019-03-16 DIAGNOSIS — Z87891 Personal history of nicotine dependence: Secondary | ICD-10-CM | POA: Insufficient documentation

## 2019-03-16 MED ORDER — CEPHALEXIN 500 MG PO CAPS: 500.0000 mg | ORAL_CAPSULE | Freq: Four times a day (QID) | ORAL | 0 refills | Status: DC

## 2019-03-16 MED ORDER — DOXYCYCLINE HYCLATE 100 MG PO CAPS: 100.0000 mg | ORAL_CAPSULE | Freq: Two times a day (BID) | ORAL | 0 refills | Status: DC

## 2019-03-16 MED ORDER — LIDOCAINE-EPINEPHRINE 2 %-1:200000 IJ SOLN
10.0000 mL | Freq: Once | INTRAMUSCULAR | Status: AC
Start: 2019-03-16 — End: 2019-03-16
  Administered 2019-03-16: 10 mL
  Filled 2019-03-16 (×2): qty 10

## 2019-03-16 MED ORDER — BUPIVACAINE HCL (PF) 0.5 % IJ SOLN
10.0000 mL | Freq: Once | INTRAMUSCULAR | Status: AC
Start: 2019-03-16 — End: 2019-03-16
  Administered 2019-03-16: 15:00:00 10 mL
  Filled 2019-03-16: qty 10

## 2019-03-16 NOTE — ED Provider Notes (Signed)
[facility] EMERGENCY DEPARTMENT Provider Note   CSN: [id number] Arrival date & time: 03/16/19  1400    History   Chief Complaint Chief Complaint  Patient presents with  . Abscess    HPI [name] is a 28 y.o. female.     HPI   [name] is a 28 y.o. female, with a history of hepatitis C, past heroin abuse, presenting to the ED with abscess to the right thigh appearing a week ago.  Pain is moderate to severe, seems to be well localized, sharp, nonradiating.  Accompanied by swelling and redness.  The area has been intermittently draining purulent material.  She states she is up-to-date on tetanus within the last 10 years.  She has a previous history of heroin abuse, but denies heroin abuse in the last year.  She has no known history of immune compromise and has not had issues with wound healing in the past. Denies fever/chills, numbness, weakness, known injury to the site, spreading redness, or any other complaints.      Past Medical History:  Diagnosis Date  . Bacterial infection   . Family history of varicose veins   . FHx: hypertension   . FHx: scoliosis   . FHx: thyroid disease   . H/O varicella   . Heart murmur   . Hepatitis    hepatitis C- 2015, confirmed this year  . Hepatitis C   . Hx: UTI (urinary tract infection) 2012  . No pertinent past medical history   . Postpartum eclampsia 08/2006    Patient Active Problem List   Diagnosis Date Noted  . NSVD (normal spontaneous vaginal delivery) 03/01/2017  . 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 Overdose of opiate or related narcotic/unintentional 08/06/2014  . Hepatitis C virus infection without hepatic coma 08/06/2014  . Cardiac arrest (HCC) 08/01/2014  . Supervision of high-risk pregnancy 12/31/2013  . Insufficient prenatal care in third trimester 12/15/2013    Past Surgical History:  Procedure Laterality Date  . NO PAST SURGERIES       OB History    Gravida  5    Para  3   Term  3   Preterm  0   AB  1   Living  3     SAB  0   TAB  1   Ectopic  0   Multiple  0   Live Births  3        Obstetric Comments  Hx of preeclampsia         Home Medications    Prior to Admission medications   Medication Sig Start Date End Date Taking? Authorizing Provider  albuterol (PROVENTIL HFA;VENTOLIN HFA) 108 (90 Base) MCG/ACT inhaler Inhale 2 puffs into the lungs every 6 (six) hours as needed for wheezing or shortness of breath. Patient not taking: Reported on 03/31/2017 11/21/16   Brock BadHarper, Charles A, MD  cephALEXin (KEFLEX) 500 MG capsule Take 1 capsule (500 mg total) by mouth 4 (four) times daily. 03/16/19   ,  C, PA-C  doxycycline (VIBRAMYCIN) 100 MG capsule Take 1 capsule (100 mg total) by mouth 2 (two) times daily. 03/16/19   ,  C, PA-C  ibuprofen (ADVIL,MOTRIN) 200 MG tablet Take 800 mg by mouth every 6 (six) hours as needed (pain).    [provider]  ibuprofen (ADVIL,MOTRIN) 600 MG tablet Take 1 tablet (600 mg total) by mouth every 6 (six) hours. Patient not taking: Reported on 03/31/2017 03/02/17  Mumaw, Hiram Comber, DO    Family History Family History  Problem Relation Age of Onset  . Anemia Mother   . Depression Mother   . Hypertension Father   . Heart disease Maternal Grandmother   . Depression Maternal Grandmother   . Heart disease Paternal Grandfather   . Anesthesia problems Neg Hx     Social History Social History   Tobacco Use  . Smoking status: Former Smoker    Packs/day: 0.25    Types: Cigarettes    Last attempt to quit: 08/23/2016    Years since quitting: 2.5  . Smokeless tobacco: Never Used  . Tobacco comment: up to half pack/day  Substance Use Topics  . Alcohol use: No    Alcohol/week: 0.0 standard drinks    Comment: none with pregnancy  . Drug use: Yes    Types: Marijuana    Comment: one episode of heroin, occasional marijuana     Allergies   Patient has no known  allergies.   Review of Systems Review of Systems  Constitutional: Negative for chills, diaphoresis and fever.  Gastrointestinal: Negative for abdominal pain, nausea and vomiting.  Musculoskeletal: Negative for arthralgias and back pain.  Skin: Positive for wound. Negative for rash.  Neurological: Negative for weakness and numbness.  All other systems reviewed and are negative.    Physical Exam Updated Vital Signs BP 113/89   Pulse 91   Temp 98.2 F (36.8 C) (Oral)   Resp 18   Ht 5' 5.5" (1.664 m)   Wt 63.5 kg   LMP 02/10/2019   SpO2 100%   BMI 22.94 kg/m   Physical Exam Vitals signs and nursing note reviewed.  Constitutional:      General: She is not in acute distress.    Appearance: She is well-developed. She is not diaphoretic.  HENT:     Head: Normocephalic and atraumatic.     Mouth/Throat:     Mouth: Mucous membranes are moist.     Pharynx: Oropharynx is clear.  Eyes:     Conjunctiva/sclera: Conjunctivae normal.  Neck:     Musculoskeletal: Neck supple.  Cardiovascular:     Rate and Rhythm: Normal rate and regular rhythm.     Pulses: Normal pulses.          Dorsalis pedis pulses are 2+ on the right side and 2+ on the left side.       Posterior tibial pulses are 2+ on the right side and 2+ on the left side.     Comments: Tactile temperature in the extremities appropriate and equal bilaterally. Pulmonary:     Effort: Pulmonary effort is normal. No respiratory distress.  Abdominal:     Palpations: Abdomen is soft.     Tenderness: There is no abdominal tenderness. There is no guarding.  Musculoskeletal:     Right lower leg: No edema.     Left lower leg: No edema.     Comments: Area of tenderness, erythema, swelling, and fluctuance to the right lateral thigh.  No tenderness outside the rather well demarcated area of erythema.  It does not seem to be located directly over muscle body.  There is no tenderness with palpation of the quadriceps or hamstring.   Similarly, there is no pain or difficulty with flexion or extension at the hip and knee.  No pain or tenderness into the groin.  Lymphadenopathy:     Cervical: No cervical adenopathy.     Lower Body: No right inguinal adenopathy. No left  inguinal adenopathy.  Skin:    General: Skin is warm and dry.  Neurological:     Mental Status: She is alert.     Comments: Sensation grossly intact to light touch in the lower extremities bilaterally.  Strength 5/5 in the bilateral lower extremities. No noted gait deficit. Coordination intact with heel to shin testing.  Psychiatric:        Mood and Affect: Mood and affect normal.        Speech: Speech normal.        Behavior: Behavior normal.                ED Treatments / Results  Labs (all labs ordered are listed, but only abnormal results are displayed) Labs Reviewed  AEROBIC CULTURE (SUPERFICIAL SPECIMEN)    EKG None  Radiology No results found.  Procedures Ultrasound ED Soft Tissue Date/Time: 03/16/2019 2:15 PM Performed by: Anselm Pancoast, PA-C Authorized by: Anselm Pancoast, PA-C   Procedure details:    Indications: localization of abscess and evaluate for cellulitis     Transverse view:  Visualized   Longitudinal view:  Visualized   Images: archived   Location:    Location: lower extremity     Side:  Right Findings:     abscess present    cellulitis present .Marland KitchenIncision and Drainage Date/Time: 03/16/2019 3:17 PM Performed by: Anselm Pancoast, PA-C Authorized by: Anselm Pancoast, PA-C   Consent:    Consent obtained:  Verbal   Consent given by:  Patient   Risks discussed:  Bleeding, incomplete drainage, pain and infection Location:    Type:  Abscess   Size:  5-7 cm   Location:  Lower extremity   Lower extremity location:  Leg   Leg location:  R upper leg Pre-procedure details:    Skin preparation:  Betadine Procedure type:    Complexity:  Complex Procedure details:    Incision types:  Cruciate   Incision  depth:  Subcutaneous   Scalpel blade:  11   Wound management:  Probed and deloculated, irrigated with saline and extensive cleaning   Drainage:  Purulent   Drainage amount:  Copious   Wound treatment:  Wound left open Post-procedure details:    Patient tolerance of procedure:  Tolerated well, no immediate complications   (including critical care time)  Medications Ordered in ED Medications  bupivacaine (MARCAINE) 0.5 % injection 10 mL (10 mLs Infiltration Given by Other 03/16/19 1516)  lidocaine-EPINEPHrine (XYLOCAINE W/EPI) 2 %-1:200000 (PF) injection 10 mL (10 mLs Infiltration Given by Other 03/16/19 1517)     Initial Impression / Assessment and Plan / ED Course  I have reviewed the triage vital signs and the nursing notes.  Pertinent labs & imaging results that were available during my care of the patient were reviewed by me and considered in my medical decision making (see chart for details).        Patient presents with an abscess to the right upper leg. Patient is nontoxic appearing, afebrile, not tachycardic, not tachypneic, not hypotensive, and is in no apparent distress.  She will have regular wound checks, the first of which will be in 2 days in the ED.  Should this abscess recur, she will need to follow-up with general surgery due to its size. The patient was given instructions for home care as well as strict return precautions. Patient voices understanding of these instructions, accepts the plan, and is comfortable with discharge.  Findings and plan of care  discussed with Shaune Pollack, MD. Pictures were reviewed and discussed by Dr. Erma Heritage and myself.    Final Clinical Impressions(s) / ED Diagnoses   Final diagnoses:  Abscess    ED Discharge Orders         Ordered    cephALEXin (KEFLEX) 500 MG capsule  4 times daily     03/16/19 1622    doxycycline (VIBRAMYCIN) 100 MG capsule  2 times daily     03/16/19 1622           Anselm Pancoast, PA-C 03/17/19 1054     Shaune Pollack, MD 03/20/19 1510

## 2019-03-16 NOTE — ED Triage Notes (Signed)
Pt c/o right thigh abscess x 1 week

## 2019-03-16 NOTE — ED Notes (Signed)
ED Provider at bedside. 

## 2019-03-16 NOTE — Discharge Instructions (Signed)
Wound Care - After I&D of Abscess  You may remove the bandage after 24 hours.  The only reason to replace the bandage is to protect clothing from drainage. Bandages, if used, should be replaced daily or whenever soiled. The wound may continue to drain for the next 2-3 days.   Cleaning: Clean the wound and surrounding area gently with tap water and mild soap. Rinse well and blot dry. You may shower normally. Soaking the wound in Epsom salt baths for no more than 15 minutes once a day may help rinse out any remaining pus and help with wound healing.  Clean the wound daily to prevent further infection. Do not use cleaners such as hydrogen peroxide or alcohol.   Scar reduction: Application of a topical antibiotic ointment, such as Neosporin, after the wound has begun to close and heal well can decrease scab formation and reduce scarring. After the wound has healed, application of ointments such as Aquaphor can also reduce scar formation.  The key to scar reduction is keeping the skin well hydrated and supple. Drinking plenty of water throughout the day (At least eight 8oz glasses of water a day) is essential to staying well hydrated.  Sun exposure: Keep the wound out of the sun. After the wound has healed, continue to protect it from the sun by wearing protective clothing or applying sunscreen.  Pain: You may use naproxen, or ibuprofen for pain.  Prevention: There is some people that have a predisposition to abscess formation, however, there are some things that can be done to prevent abscesses in many people.  Most abscesses form because bacteria that naturally lives on the skin gets trapped underneath the skin.  This can occur through openings too small to see. Before and after any area of skin is shaved, wax, or abraded in any manner, the area should be washed with soap and water and rinsed well.   If you are having trouble with recurrent abscesses, it may be wise to perform a chlorhexidine wash  regimen.  For 1 week, wash all of your body with chlorhexidine (available over-the-counter at most pharmacies). You may also need to reevaluate your use of daily soap as soaps with perfumes or dyes can increase the chances of infection in some people.  Follow up: Please return to the ED or go to your primary care provider in 2-3 days for a wound check to assure proper healing.  Should symptoms recur, follow-up with the general surgery practice.  Return: Return to the ED sooner should signs of worsening infection arise, such as spreading redness, worsening puffiness/swelling, severe increase in pain, fever over 100.2F, or any other major issues.  For prescription assistance, may try using prescription discount sites or apps, such as goodrx.com

## 2019-03-19 LAB — AEROBIC CULTURE W GRAM STAIN (SUPERFICIAL SPECIMEN): Special Requests: NORMAL

## 2019-03-19 LAB — AEROBIC CULTURE? (SUPERFICIAL SPECIMEN)

## 2019-03-20 ENCOUNTER — Telehealth: Payer: Self-pay | Admitting: *Deleted

## 2019-03-20 NOTE — Telephone Encounter (Signed)
Post ED Visit - Positive Culture Follow-up  Culture report reviewed by antimicrobial stewardship pharmacist: [facility] []  Enzo Bi, Pharm.D. []  Celedonio Miyamoto, Pharm.D., BCPS AQ-ID []  Garvin Fila, Pharm.D., BCPS []  Georgina Pillion, [address].D., BCPS []  [place], [address].D., BCPS, AAHIVP []  Estella Husk, Pharm.D., BCPS, AAHIVP [x]  Lysle Pearl, PharmD, BCPS []  Phillips Climes, PharmD, BCPS []  Agapito Games, PharmD, BCPS []  Verlan Friends, PharmD []  Mervyn Gay, PharmD, BCPS []  Vinnie Level, PharmD  [facility] []  Len Childs, PharmD []  Greer Pickerel, PharmD []  Adalberto Cole, PharmD []  Perlie Gold, Rph []  Lonell Face) Jean Rosenthal, PharmD []  Earl Many, PharmD []  Junita Push, PharmD []  Dorna Leitz, PharmD []  Terrilee Files, PharmD []  Lynann Beaver, PharmD []  Keturah Barre, PharmD []  Loralee Pacas, PharmD []  Bernadene Person, PharmD   Positive wound culture Treated with Cephalexin, organism sensitive to the same and no further patient follow-up is required at this time.  Virl Axe [facility] 03/20/2019, 8:29 AM

## 2019-04-23 ENCOUNTER — Emergency Department (HOSPITAL_COMMUNITY)
Admission: EM | Admit: 2019-04-23 | Discharge: 2019-04-23 | Disposition: A | Discharge status: Home / Self Care | Payer: Self-pay | Attending: Emergency Medicine | Admitting: Emergency Medicine

## 2019-04-23 ENCOUNTER — Other Ambulatory Visit: Payer: Self-pay

## 2019-04-23 ENCOUNTER — Encounter (HOSPITAL_COMMUNITY): Payer: Self-pay | Admitting: *Deleted

## 2019-04-23 DIAGNOSIS — F19920 Other psychoactive substance use, unspecified with intoxication, uncomplicated: Secondary | ICD-10-CM

## 2019-04-23 DIAGNOSIS — Z87891 Personal history of nicotine dependence: Secondary | ICD-10-CM | POA: Insufficient documentation

## 2019-04-23 DIAGNOSIS — E860 Dehydration: Secondary | ICD-10-CM

## 2019-04-23 LAB — COMPREHENSIVE METABOLIC PANEL
ALT: 128 U/L — ABNORMAL HIGH (ref 0–44)
AST: 69 U/L — ABNORMAL HIGH (ref 15–41)
Albumin: 4.3 g/dL (ref 3.5–5.0)
Alkaline Phosphatase: 107 U/L (ref 38–126)
Anion gap: 13 (ref 5–15)
BUN: 25 mg/dL — ABNORMAL HIGH (ref 6–20)
CO2: 22 mmol/L (ref 22–32)
Calcium: 8.8 mg/dL — ABNORMAL LOW (ref 8.9–10.3)
Chloride: 110 mmol/L (ref 98–111)
Creatinine, Ser: 1.2 mg/dL — ABNORMAL HIGH (ref 0.44–1.00)
GFR calc Af Amer: >60 mL/min (ref >60)
GFR calc non Af Amer: >60 mL/min (ref >60)
Glucose, Bld: 93 mg/dL (ref 70–99)
Potassium: 3.4 mmol/L — ABNORMAL LOW (ref 3.5–5.1)
Sodium: 145 mmol/L (ref 135–145)
Total Bilirubin: 0.6 mg/dL (ref 0.3–1.2)
Total Protein: 8.1 g/dL (ref 6.5–8.1)

## 2019-04-23 LAB — CBC WITH DIFFERENTIAL/PLATELET
Abs Immature Granulocytes: 0.19 10*3/uL — ABNORMAL HIGH (ref 0.00–0.07)
Basophils Absolute: 0.1 10*3/uL (ref 0.0–0.1)
Basophils Relative: 0 %
Eosinophils Absolute: 0.2 10*3/uL (ref 0.0–0.5)
Eosinophils Relative: 1 %
HCT: 40.4 % (ref 36.0–46.0)
Hemoglobin: 13.3 g/dL (ref 12.0–15.0)
Immature Granulocytes: 1 %
Lymphocytes Relative: 12 %
Lymphs Abs: 1.9 10*3/uL (ref 0.7–4.0)
MCH: 29.7 pg (ref 26.0–34.0)
MCHC: 32.9 g/dL (ref 30.0–36.0)
MCV: 90.2 fL (ref 80.0–100.0)
Monocytes Absolute: 0.8 10*3/uL (ref 0.1–1.0)
Monocytes Relative: 5 %
Neutro Abs: 13.1 10*3/uL — ABNORMAL HIGH (ref 1.7–7.7)
Neutrophils Relative %: 81 %
Platelets: 286 10*3/uL (ref 150–400)
RBC: 4.48 MIL/uL (ref 3.87–5.11)
RDW: 13.1 % (ref 11.5–15.5)
WBC: 16.1 10*3/uL — ABNORMAL HIGH (ref 4.0–10.5)
nRBC: 0 % (ref 0.0–0.2)

## 2019-04-23 LAB — CK: Total CK: 651 U/L — ABNORMAL HIGH (ref 38–234)

## 2019-04-23 LAB — I-STAT BETA HCG BLOOD, ED (MC, WL, AP ONLY): I-stat hCG, quantitative: <5 m[IU]/mL (ref <5)

## 2019-04-23 MED ORDER — SODIUM CHLORIDE 0.9 % IV BOLUS
2000.0000 mL | Freq: Once | INTRAVENOUS | Status: AC
  Administered 2019-04-23: 17:00:00 2000 mL via INTRAVENOUS

## 2019-04-23 MED ORDER — LORAZEPAM 2 MG/ML IJ SOLN
1.0000 mg | Freq: Once | INTRAMUSCULAR | Status: AC
  Administered 2019-04-23: 1 mg via INTRAVENOUS
  Filled 2019-04-23: qty 1

## 2019-04-23 NOTE — ED Notes (Addendum)
Received call back from grandmother. She voices concerns about d/c pt. Grandmother on phone with edp at this time.  Grandmother lives in [place]. rn or grandmother unable to get pt mother to answer

## 2019-04-23 NOTE — Discharge Instructions (Signed)
Substance Abuse Treatment Programs ° °Intensive Outpatient Programs °[facility]     °[address]      °[place], [place]                   °[phone]      ° °[facility] °[address] °[place], [place] °[phone] ° °[facility]     °(Inpatient and outpatient)     °[address]           °[phone]   ° °[facility] °[phone] (Suboxone and Methadone) ° °[address]      °[place], [place] [address]      °336-882-2125      ° °[address] °[place], [place] °[phone] ° °[facility] (Outpatient/Inpatient, Chemical)    °(insurance only) [phone]      °       °[facility] (Groups & Residential) °[place], [place] °[phone] ° °   °[facility]     °[address]     °[place], [place]      °[phone]      ° °[facility] (for caregivers and family) °[address] °[place], [place] °336-299-4655 ° ° ° ° ° °Residential Treatment Programs °[facility]      °[address], [place], [place] [address]  °[phone]      ° °[facility] °[address] [place], [place] [address] °[phone] ° °[facility]        °[phone]      ° °[facility] °[phone] ° °[facility]             °[address]                                         °[place], [place]                                                °[phone] or [phone]                              ° °[facility] [place] °[address] °[place], [address] °[phone] ° °[facility]    °[address]      °[place], [place]     °[phone]      ° °[facility] °[address] °[place], [place] °336-285-9073 ° °[facility]   °[address]     °[place], [place] [address]     °[phone]      °Admissions: 8am-3pm M-F ° °[facility] °[address] °,  [place] °336-227-7417 ° °[facility]: Residential Program (90 Days)   °[place], [place]      °336-725-8389 or 800-758-6077    ° °[facility] °[place], [place] °(Walk in Hours over the weekend or by referral) ° °[facility] °[address], [place], [place] [address] °([phone] ° °Crisis Mobile: [facility]:  1-877-626-1772 (for crisis response 24 hours a day) °[facility] Hotline:      1-800-256-2452 °Outpatient Psychiatry and Counseling ° °[facility]: Mobile Crisis   Management 24 hours:  1-877-626-1772 ° °[facility] sliding scale fee and walk in schedule: M-F 8am-12pm/1pm-3pm °[address]  °[place], [place] [address] °[phone] ° °[facility] °[address] °[place], [place] [address] °336-703-9650 ° °[facility] ([facility])- new patient walk-in appointments available Monday - Friday 8am -3pm.          °[address] °[place], [place] [address] °336-676-6840 or crisis line- 336-676-6905 ° °[facility]/ Intensive Outpatient Therapy Program °[address] °[place], [place] [address] °336-832-9804 ° °[facility]                  °Crisis Services      °[phone]      °[address]     °[place], [place] [address]                ° °[facility]   °[facility] °[phone] °[address] °[place], [place] [address] ° ° °[facility]          °[address]  °[place], [place] [address]       °([phone] ° °[facility] °[address] °[place], [place] [address] °336-292-1510 ° °[facility]    °[address]    °[place], [place] [address]     °[phone]      ° °Parish McKinney, MD     °[address]     °[place] [address]     °[phone]     °  °[facility] °[address] °[place] [address] ° °[facility]     °[address]     °[place], [place]      °336-542-2076      ° °[facility] °Shamsher Ahluwalia, MD °[address] °[place], [place] [address] °[phone] ° °[facility]     °[address]     °[place], [place] [address]     °336-274-1237      ° °[facility] °[address] °[place], [place] [address] °336-854-4450 °Resources for Temporary Residential Assistance/Crisis Centers ° °DAY CENTERS °[facility] °M-F 8am-3pm   °[address] [place], [place] [address]   336-332-0824 °Services include: laundry, barbering, support groups, case management, phone  & computer access, showers, AA/NA mtgs, mental health/substance abuse nurse, job skills class, disability information, VA assistance, spiritual classes, etc.  ° °HOMELESS SHELTERS ° °[facility]     °[facility]   °[address], [place]     °336.271.5959       °       °[facility] (women and children)       °[address] °[place], [place] [address] °336-275-0820 °[email or web address] for application and process °Application Required ° °[facility]   °[address]    °[place] [address]     °[phone]       °             °[facility] °[address] °[place], [place] [address] °[phone] °336-235-0363(schedule application appt.) °Application Required ° °[facility] (women only)    °[address]     °[place], [place] [address]     °336-884-1039      °  Intake starts 6pm daily °Need valid ID, SSC, & Police report °[facility] [place] °[address] °[place], [place] °336-881-5420 °Application Required ° °[facility] (men only)     °[address]      °[place], [place]     °[phone]      ° °[facility] °(Pregnant women only) °[address] °[place], [place] °336-275-0206 ° °[facility]      °[address]      °[place], [place] [address]     °[phone]      °       °[facility] °[address] °[place], [place] °336-723-1848 °90 day commitment/SA/Application process ° °[facility](men only)     °[address]     °[place], [place]     °[phone]       °Check-in at 7pm     °       °[facility] °[address] °[place], [place] [address] °336-248-6684 °Men/Women/Women and Children must be there by 7 pm ° °[facility] °[place], [place] °336-722-8721                ° °

## 2019-04-23 NOTE — ED Notes (Signed)
Pt ambulating to rest room.

## 2019-04-23 NOTE — ED Notes (Signed)
Called family of patient. No answer. Left voicemail to call back

## 2019-04-23 NOTE — ED Notes (Signed)
Patient pulled out IV.
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
## 2019-11-20 NOTE — L&D Delivery Note (Signed)
LABOR COURSE Patient was admitted for elective IOL. She is currently incarcerated and facing extradition to [place]. She received Cytotec and a foley balloon for cervical ripening. She was augmented with Pitocin.  Delivery Note Called to room and patient was complete and pushing. Head delivered ROT. No nuchal cord present. Shoulder and body delivered in usual fashion. At  1943 a viable female was delivered via Vaginal, Spontaneous (Presentation:ROT ; ROA  ).  Infant with spontaneous cry, placed on mother's abdomen, dried and stimulated. Cord clamped x 2 after one-minute delay, and cut by patient. Cord blood drawn. Placenta delivered spontaneously with gentle cord traction. Appears intact. Fundus firm with massage and Pitocin. Labia, perineum, vagina, and cervix inspected.    APGAR:9 , 9 ; weight: 3405g.   Cord: 3VC with the following complications:nonr.   Cord pH: not indicated  Anesthesia:  none Episiotomy: None Lacerations: none Q. Blood Loss (mL): 358  Mom to postpartum.  Baby to Couplet care / Skin to Skin.  Clayton Bibles, CNM 08/21/20 8:45 PM

## 2020-04-23 ENCOUNTER — Inpatient Hospital Stay (HOSPITAL_COMMUNITY): Payer: Self-pay

## 2020-04-23 ENCOUNTER — Inpatient Hospital Stay (HOSPITAL_COMMUNITY)
Admission: AD | Admit: 2020-04-23 | Discharge: 2020-04-23 | Disposition: A | Discharge status: Home / Self Care | Payer: Self-pay | Attending: Family Medicine | Admitting: Family Medicine

## 2020-04-23 ENCOUNTER — Encounter (HOSPITAL_COMMUNITY): Payer: Self-pay

## 2020-04-23 DIAGNOSIS — R109 Unspecified abdominal pain: Secondary | ICD-10-CM

## 2020-04-23 DIAGNOSIS — Z3A21 21 weeks gestation of pregnancy: Secondary | ICD-10-CM | POA: Insufficient documentation

## 2020-04-23 DIAGNOSIS — N760 Acute vaginitis: Secondary | ICD-10-CM

## 2020-04-23 DIAGNOSIS — O26899 Other specified pregnancy related conditions, unspecified trimester: Secondary | ICD-10-CM

## 2020-04-23 DIAGNOSIS — Z79899 Other long term (current) drug therapy: Secondary | ICD-10-CM | POA: Insufficient documentation

## 2020-04-23 DIAGNOSIS — Z791 Long term (current) use of non-steroidal anti-inflammatories (NSAID): Secondary | ICD-10-CM | POA: Insufficient documentation

## 2020-04-23 DIAGNOSIS — O23592 Infection of other part of genital tract in pregnancy, second trimester: Secondary | ICD-10-CM | POA: Insufficient documentation

## 2020-04-23 DIAGNOSIS — B9689 Other specified bacterial agents as the cause of diseases classified elsewhere: Secondary | ICD-10-CM | POA: Insufficient documentation

## 2020-04-23 DIAGNOSIS — Z87891 Personal history of nicotine dependence: Secondary | ICD-10-CM | POA: Insufficient documentation

## 2020-04-23 DIAGNOSIS — Z8759 Personal history of other complications of pregnancy, childbirth and the puerperium: Secondary | ICD-10-CM | POA: Insufficient documentation

## 2020-04-23 DIAGNOSIS — O26892 Other specified pregnancy related conditions, second trimester: Secondary | ICD-10-CM

## 2020-04-23 LAB — URINALYSIS, ROUTINE W REFLEX MICROSCOPIC
Bilirubin Urine: NEGATIVE
Glucose, UA: NEGATIVE mg/dL
Hgb urine dipstick: NEGATIVE
Ketones, ur: NEGATIVE mg/dL
Leukocytes,Ua: NEGATIVE
Nitrite: NEGATIVE
Protein, ur: NEGATIVE mg/dL
Specific Gravity, Urine: 1.024 (ref 1.005–1.030)
pH: 6 (ref 5.0–8.0)

## 2020-04-23 LAB — POCT PREGNANCY, URINE: Preg Test, Ur: POSITIVE — AB

## 2020-04-23 LAB — WET PREP, GENITAL
Sperm: NONE SEEN
Trich, Wet Prep: NONE SEEN
Yeast Wet Prep HPF POC: NONE SEEN

## 2020-04-23 MED ORDER — METRONIDAZOLE 0.75 % VA GEL: 1.0000 | Freq: Every day | VAGINAL | 1 refills | Status: DC

## 2020-04-23 NOTE — MAU Note (Signed)
Pt reports to mau with c/o lower abd cramping with some mucous like dc.  Pt denies vag bleeding.  Pt states she has not started prenatal care and last LMP was first week of Jan.

## 2020-04-23 NOTE — MAU Provider Note (Addendum)
Chief Complaint:  Abdominal Pain and Vaginal Discharge   First Provider Initiated Contact with Patient 04/23/20 1740      HPI: [name] is a 29 y.o. [id number] at [redacted]w[redacted]d by LMP who presents to maternity admissions reporting cramping and mucous-like discharge for past two days. She has otherwise felt well this pregnancy. No PNC thus far. Reports cramping pain located in lower pelvic region. Cramping is intermittent. Nothing has made it better or worse. She did take Ibuprofen with minimal improvement. Rates pain at 7/10 when it occurs and lasts for a few minutes. Denies vaginal itching. Discharge might have a green tint to it. She came today due to the discharge, she has not been concerned about cramping. No pain currently.  She reports good fetal movement, denies LOF, vaginal bleeding, urinary symptoms, h/a, dizziness, n/v, or fever/chills.    Past Medical History: Past Medical History:  Diagnosis Date  . Bacterial infection   . Family history of varicose veins   . FHx: hypertension   . FHx: scoliosis   . FHx: thyroid disease   . H/O varicella   . Heart murmur   . Hepatitis    hepatitis C- 2015, confirmed this year  . Hepatitis C   . Hx: UTI (urinary tract infection) 2012  . No pertinent past medical history   . Postpartum eclampsia 08/2006    Past obstetric history: OB History  Gravida Para Term Preterm AB Living  6 4 4  0 1 4  SAB TAB Ectopic Multiple Live Births  0 1 0 0 4    # Outcome Date GA Lbr Len/2nd Weight Sex Delivery Anes PTL Lv  6 Current           5 Term 02/28/17 [redacted]w[redacted]d   F Vag-Spont   LIV  4 TAB 12/20/14          3 Term 12/31/13 [redacted]w[redacted]d 10:32 / 00:50 3620 g M Vag-Spont EPI  LIV  2 Term 02/28/12 [redacted]w[redacted]d 09:16 / 00:27 2965 g M Vag-Spont EPI  LIV  1 Term 08/2006 [redacted]w[redacted]d  2948 g F Vag-Spont EPI  LIV    Obstetric Comments  Hx of preeclampsia    Past Surgical History: Past Surgical History:  Procedure Laterality Date  . NO PAST SURGERIES      Family  History: Family History  Problem Relation Age of Onset  . Anemia Mother   . Depression Mother   . Hypertension Father   . Heart disease Maternal Grandmother   . Depression Maternal Grandmother   . Heart disease Paternal Grandfather   . Anesthesia problems Neg Hx     Social History: Social History   Tobacco Use  . Smoking status: Former Smoker    Packs/day: 0.25    Types: Cigarettes    Quit date: 08/23/2016    Years since quitting: 3.6  . Smokeless tobacco: Never Used  . Tobacco comment: up to half pack/day  Substance Use Topics  . Alcohol use: Not Currently    Alcohol/week: 0.0 standard drinks    Comment: none with pregnancy  . Drug use: Yes    Types: Marijuana, Methamphetamines    Comment: last use mari today and meth yesterday     Allergies: No Known Allergies  Meds:  Medications Prior to Admission  Medication Sig Dispense Refill Last Dose  . albuterol (PROVENTIL HFA;VENTOLIN HFA) 108 (90 Base) MCG/ACT inhaler Inhale 2 puffs into the lungs every 6 (six) hours as needed for wheezing or shortness of breath. (Patient not  taking: Reported on 03/31/2017) 1 Inhaler prn   . cephALEXin (KEFLEX) 500 MG capsule Take 1 capsule (500 mg total) by mouth 4 (four) times daily. 20 capsule 0   . doxycycline (VIBRAMYCIN) 100 MG capsule Take 1 capsule (100 mg total) by mouth 2 (two) times daily. 20 capsule 0   . ibuprofen (ADVIL,MOTRIN) 200 MG tablet Take 800 mg by mouth every 6 (six) hours as needed (pain).     Marland Kitchen ibuprofen (ADVIL,MOTRIN) 600 MG tablet Take 1 tablet (600 mg total) by mouth every 6 (six) hours. (Patient not taking: Reported on 03/31/2017) 30 tablet 0     ROS:  Review of Systems All other systems negative unless noted above in HPI.   I have reviewed patient's Past Medical Hx, Surgical Hx, Family Hx, Social Hx, medications and allergies.   Physical Exam   Patient Vitals for the past 24 hrs:  BP Temp Temp src Pulse Resp SpO2  04/23/20 1728 106/65 97.9 F (36.6 C) Oral  (!) 105 16 94 %   Constitutional: Well-developed, well-nourished female in no acute distress.  Cardiovascular: mild tachycardia  Respiratory: normal effort GI: Abd soft, non-tender, gravid appropriate for gestational age.  MS: Extremities nontender, no edema, normal ROM Neurologic: Alert and oriented x 4.  GU: Neg CVAT. Bimanual exam: Cervix 0/long/high, firm, anterior, neg CMT, uterus nontender, nonenlarged, adnexa without tenderness, enlargement, or mass  FHR: 150 bpm       Labs: Results for orders placed or performed during the hospital encounter of 04/23/20 (from the past 24 hour(s))  Pregnancy, urine POC     Status: Abnormal   Collection Time: 04/23/20  5:13 PM  Result Value Ref Range   Preg Test, Ur POSITIVE (A) NEGATIVE  Urinalysis, Routine w reflex microscopic     Status: None   Collection Time: 04/23/20  5:14 PM  Result Value Ref Range   Color, Urine YELLOW YELLOW   APPearance CLEAR CLEAR   Specific Gravity, Urine 1.024 1.005 - 1.030   pH 6.0 5.0 - 8.0   Glucose, UA NEGATIVE NEGATIVE mg/dL   Hgb urine dipstick NEGATIVE NEGATIVE   Bilirubin Urine NEGATIVE NEGATIVE   Ketones, ur NEGATIVE NEGATIVE mg/dL   Protein, ur NEGATIVE NEGATIVE mg/dL   Nitrite NEGATIVE NEGATIVE   Leukocytes,Ua NEGATIVE NEGATIVE  Wet prep, genital     Status: Abnormal   Collection Time: 04/23/20  5:50 PM  Result Value Ref Range   Yeast Wet Prep HPF POC NONE SEEN NONE SEEN   Trich, Wet Prep NONE SEEN NONE SEEN   Clue Cells Wet Prep HPF POC PRESENT (A) NONE SEEN   WBC, Wet Prep HPF POC MANY (A) NONE SEEN   Sperm NONE SEEN       Imaging:  No results found.  MAU Course/MDM: Orders Placed This Encounter  Procedures  . Wet prep, genital  . [place] MFM OB DETAIL +14 WK  . Urinalysis, Routine w reflex microscopic  . Pregnancy, urine POC  . Discharge patient    Meds ordered this encounter  Medications  . metroNIDAZOLE (METROGEL) 0.75 % vaginal gel    Sig: Place 1 Applicatorful vaginally at  bedtime. Apply one applicatorful to vagina at bedtime for 5 days    Dispense:  70 g    Refill:  1      Assessment: 1. Abdominal pain in pregnancy, second trimester   2. Cramping affecting pregnancy, antepartum   3. Bacterial vaginosis    Patient presented with cramping and vaginal discharge. Cramping  not present during visit. Discharge presumed secondary to BV per wet mount and script sent on discharge.  Complete US done as patient without PNC thus far and to determine placental location prior to cervical exam.  [place] c/w LMP, posterior placenta, breech, FHR 154. Cervix long and closed on exam.  Discussed results with patient. Advised to establish care at [facility] or [facility].  Known O pos blood type per chart.  Pt discharged with strict return precautions.  Plan: Discharge home Labor precautions and fetal kick counts  Allergies as of 04/23/2020   No Known Allergies     Medication List    STOP taking these medications   cephALEXin 500 MG capsule Commonly known as: KEFLEX   doxycycline 100 MG capsule Commonly known as: VIBRAMYCIN   ibuprofen 200 MG tablet Commonly known as: ADVIL   ibuprofen 600 MG tablet Commonly known as: ADVIL     TAKE these medications   albuterol 108 (90 Base) MCG/ACT inhaler Commonly known as: VENTOLIN HFA Inhale 2 puffs into the lungs every 6 (six) hours as needed for wheezing or shortness of breath.   metroNIDAZOLE 0.75 % vaginal gel Commonly known as: METROGEL Place 1 Applicatorful vaginally at bedtime. Apply one applicatorful to vagina at bedtime for 5 days       Jerilynn Birkenhead, MD [facility] Family Medicine Fellow, [facility] for [facility], [facility] 04/23/2020 8:44 PM

## 2020-04-23 NOTE — MAU Note (Signed)
Pt using bathroom for urine sample

## 2020-04-25 LAB — GC/CHLAMYDIA PROBE AMP (~~LOC~~) NOT AT ARMC
Chlamydia: NEGATIVE
Comment: NEGATIVE
Comment: NORMAL
Neisseria Gonorrhea: NEGATIVE

## 2020-06-14 ENCOUNTER — Encounter: Payer: PRIVATE HEALTH INSURANCE | Admitting: Obstetrics

## 2020-06-29 ENCOUNTER — Encounter: Payer: Self-pay | Admitting: Obstetrics

## 2020-06-29 ENCOUNTER — Ambulatory Visit (INDEPENDENT_AMBULATORY_CARE_PROVIDER_SITE_OTHER): Payer: PRIVATE HEALTH INSURANCE | Admitting: Obstetrics

## 2020-06-29 ENCOUNTER — Other Ambulatory Visit (HOSPITAL_COMMUNITY)
Admission: RE | Admit: 2020-06-29 | Discharge: 2020-06-29 | Disposition: A | Discharge status: Home / Self Care | Payer: PRIVATE HEALTH INSURANCE | Source: Ambulatory Visit | Attending: Obstetrics | Admitting: Obstetrics

## 2020-06-29 VITALS — BP 102/74 | HR 101 | Wt 170.4 lb

## 2020-06-29 DIAGNOSIS — Z3480 Encounter for supervision of other normal pregnancy, unspecified trimester: Secondary | ICD-10-CM | POA: Diagnosis present

## 2020-06-29 DIAGNOSIS — B1710 Acute hepatitis C without hepatic coma: Secondary | ICD-10-CM

## 2020-06-29 DIAGNOSIS — O98413 Viral hepatitis complicating pregnancy, third trimester: Secondary | ICD-10-CM

## 2020-06-29 DIAGNOSIS — Z8674 Personal history of sudden cardiac arrest: Secondary | ICD-10-CM

## 2020-06-29 DIAGNOSIS — Z3A31 31 weeks gestation of pregnancy: Secondary | ICD-10-CM

## 2020-06-29 DIAGNOSIS — O0933 Supervision of pregnancy with insufficient antenatal care, third trimester: Secondary | ICD-10-CM

## 2020-06-29 DIAGNOSIS — Z8759 Personal history of other complications of pregnancy, childbirth and the puerperium: Secondary | ICD-10-CM

## 2020-06-29 DIAGNOSIS — O99323 Drug use complicating pregnancy, third trimester: Secondary | ICD-10-CM

## 2020-06-29 DIAGNOSIS — O09293 Supervision of pregnancy with other poor reproductive or obstetric history, third trimester: Secondary | ICD-10-CM

## 2020-06-29 DIAGNOSIS — O0990 Supervision of high risk pregnancy, unspecified, unspecified trimester: Secondary | ICD-10-CM

## 2020-06-29 DIAGNOSIS — K219 Gastro-esophageal reflux disease without esophagitis: Secondary | ICD-10-CM

## 2020-06-29 MED ORDER — OMEPRAZOLE 20 MG PO CPDR: 20.0000 mg | DELAYED_RELEASE_CAPSULE | Freq: Two times a day (BID) | ORAL | 5 refills | Status: DC

## 2020-06-29 NOTE — Progress Notes (Signed)
Subjective:    [name] is being seen today for her first obstetrical visit.  This is not a planned pregnancy. She is at [redacted]w[redacted]d gestation. Her obstetrical history is significant for pre-eclampsia and substance abuse. Relationship with FOB: significant other, not living together. Patient does not intend to breast feed. Pregnancy history fully reviewed.  The information documented in the HPI was reviewed and verified.  Menstrual History: OB History    Gravida  6   Para  4   Term  4   Preterm  0   AB  1   Living  4     SAB  0   TAB  1   Ectopic  0   Multiple  0   Live Births  4        Obstetric Comments  Hx of preeclampsia         Patient's last menstrual period was 11/22/2019.    Past Medical History:  Diagnosis Date  . Bacterial infection   . Family history of varicose veins   . FHx: hypertension   . FHx: scoliosis   . FHx: thyroid disease   . H/O varicella   . Heart murmur   . Hepatitis    hepatitis C- 2015, confirmed this year  . Hepatitis C   . Hx: UTI (urinary tract infection) 2012  . No pertinent past medical history   . Postpartum eclampsia 08/2006    Past Surgical History:  Procedure Laterality Date  . NO PAST SURGERIES      (Not in a hospital admission)  No Known Allergies  Social History   Tobacco Use  . Smoking status: Former Smoker    Packs/day: 0.25    Types: Cigarettes    Quit date: 08/23/2016    Years since quitting: 3.8  . Smokeless tobacco: Never Used  . Tobacco comment: up to half pack/day  Substance Use Topics  . Alcohol use: Not Currently    Alcohol/week: 0.0 standard drinks    Comment: none with pregnancy    Family History  Problem Relation Age of Onset  . Anemia Mother   . Depression Mother   . Hypertension Father   . Heart disease Maternal Grandmother   . Depression Maternal Grandmother   . Heart disease Paternal Grandfather   . Anesthesia problems Neg Hx      Review of Systems Constitutional:  negative for weight loss Gastrointestinal: negative for vomiting Genitourinary:negative for genital lesions and vaginal discharge and dysuria Musculoskeletal:negative for back pain Behavioral/Psych: negative for abusive relationship, depression, illegal drug usage and tobacco use    Objective:    BP 102/74   Pulse (!) 101   Wt 170 lb 6.4 oz (77.3 kg)   LMP 11/22/2019   BMI 28.36 kg/m  General Appearance:    Alert, cooperative, no distress, appears stated age  Head:    Normocephalic, without obvious abnormality, atraumatic  Eyes:    PERRL, conjunctiva/corneas clear, EOM's intact, fundi    benign, both eyes  Ears:    Normal TM's and external ear canals, both ears  Nose:   Nares normal, septum midline, mucosa normal, no drainage    or sinus tenderness  Throat:   Lips, mucosa, and tongue normal; teeth and gums normal  Neck:   Supple, symmetrical, trachea midline, no adenopathy;    thyroid:  no enlargement/tenderness/nodules; no carotid   bruit or JVD  Back:     Symmetric, no curvature, ROM normal, no CVA tenderness  Lungs:  Clear to auscultation bilaterally, respirations unlabored  Chest Wall:    No tenderness or deformity   Heart:    Regular rate and rhythm, S1 and S2 normal, no murmur, rub   or gallop  Breast Exam:    No tenderness, masses, or nipple abnormality  Abdomen:     Soft, non-tender, bowel sounds active all four quadrants,    no masses, no organomegaly  Genitalia:    Normal female without lesion, discharge or tenderness  Extremities:   Extremities normal, atraumatic, no cyanosis or edema  Pulses:   2+ and symmetric all extremities  Skin:   Skin color, texture, turgor normal, no rashes or lesions  Lymph nodes:   Cervical, supraclavicular, and axillary nodes normal  Neurologic:   CNII-XII intact, normal strength, sensation and reflexes    throughout      Lab Review Urine pregnancy test Labs reviewed yes Radiologic studies reviewed no  Assessment:    Pregnancy  at [redacted]w[redacted]d weeks    Plan:     1. Supervision of high risk pregnancy, antepartum Rx: - CBC/D/Plt+RPR+Rh+ABO+Rub Ab... - Cervicovaginal ancillary only( [place]) - Cytology - PAP( [place]) - Genetic Screening - US MFM OB DETAIL +14 WK; Future - Culture, OB Urine  2. Substance abuse affecting pregnancy in third trimester, antepartum Rx: - Drug Screen, Urine  3. Acute hepatitis C virus infection without hepatic coma - clinically stable  4. Late prenatal care affecting pregnancy in third trimester  5. History of cardiac arrest after opiate overdose   Prenatal vitamins.  Counseling provided regarding continued use of seat belts, cessation of alcohol consumption, smoking or use of illicit drugs; infection precautions i.e., influenza/TDAP immunizations, toxoplasmosis,CMV, parvovirus, listeria and varicella; workplace safety, exercise during pregnancy; routine dental care, safe medications, sexual activity, hot tubs, saunas, pools, travel, caffeine use, fish and methlymercury, potential toxins, hair treatments, varicose veins Weight gain recommendations per IOM guidelines reviewed: underweight/BMI< 18.5--> gain 28 - 40 lbs; normal weight/BMI 18.5 - 24.9--> gain 25 - 35 lbs; overweight/BMI 25 - 29.9--> gain 15 - 25 lbs; obese/BMI >30->gain  11 - 20 lbs Problem list reviewed and updated. FIRST/CF mutation testing/NIPT/QUAD SCREEN/fragile X/Ashkenazi Jewish population testing/Spinal muscular atrophy discussed: requested. Role of ultrasound in pregnancy discussed; fetal survey: requested. Amniocentesis discussed: not indicated.   Orders Placed This Encounter  Procedures  . Culture, OB Urine  . [place] MFM OB DETAIL +14 WK    Standing Status:   Future    Standing Expiration Date:   06/29/2021    Order Specific Question:   Reason for Exam (SYMPTOM  OR DIAGNOSIS REQUIRED)    Answer:   Anatomy    Order Specific Question:   Preferred Location    Answer:   [facility] Ultrasound  .  CBC/D/Plt+RPR+Rh+ABO+Rub Ab...  . Genetic Screening  . Drug Screen, Urine    Follow up in 1 weeks. 50% of 25 min visit spent on counseling and coordination of care.     [name], MD 06/29/2020 10:10 AM

## 2020-06-29 NOTE — Progress Notes (Signed)
Pt presents for 3rd trimester initial OB visit. Pt is in custody of [facility] All labs and pap due today

## 2020-06-30 LAB — CERVICOVAGINAL ANCILLARY ONLY
Bacterial Vaginitis (gardnerella): NEGATIVE
Candida Glabrata: NEGATIVE
Candida Vaginitis: NEGATIVE
Chlamydia: NEGATIVE
Comment: NEGATIVE
Comment: NEGATIVE
Comment: NEGATIVE
Comment: NEGATIVE
Comment: NEGATIVE
Comment: NORMAL
Neisseria Gonorrhea: NEGATIVE
Trichomonas: NEGATIVE

## 2020-07-01 LAB — DRUG SCREEN, URINE
Amphetamines, Urine: NEGATIVE ng/mL
Barbiturate screen, urine: NEGATIVE ng/mL
Benzodiazepine Quant, Ur: NEGATIVE ng/mL
Cannabinoid Quant, Ur: NEGATIVE ng/mL
Cocaine (Metab.): NEGATIVE ng/mL
Opiate Quant, Ur: NEGATIVE ng/mL
PCP Quant, Ur: NEGATIVE ng/mL

## 2020-07-01 LAB — CULTURE, OB URINE

## 2020-07-01 LAB — URINE CULTURE, OB REFLEX

## 2020-07-01 LAB — CYTOLOGY - PAP
Diagnosis: NEGATIVE
Diagnosis: REACTIVE

## 2020-07-02 LAB — CBC/D/PLT+RPR+RH+ABO+RUB AB...
Antibody Screen: NEGATIVE
Basophils Absolute: 0 10*3/uL (ref 0.0–0.2)
Basos: 0 %
EOS (ABSOLUTE): 0.1 10*3/uL (ref 0.0–0.4)
Eos: 1 %
HCV Ab: >11 s/co ratio — ABNORMAL HIGH (ref 0.0–0.9)
HIV Screen 4th Generation wRfx: NONREACTIVE
Hematocrit: 37.8 % (ref 34.0–46.6)
Hemoglobin: 12 g/dL (ref 11.1–15.9)
Hepatitis B Surface Ag: NEGATIVE
Immature Grans (Abs): 0.1 10*3/uL (ref 0.0–0.1)
Immature Granulocytes: 1 %
Lymphocytes Absolute: 1.9 10*3/uL (ref 0.7–3.1)
Lymphs: 18 %
MCH: 29.9 pg (ref 26.6–33.0)
MCHC: 31.7 g/dL (ref 31.5–35.7)
MCV: 94 fL (ref 79–97)
Monocytes Absolute: 0.7 10*3/uL (ref 0.1–0.9)
Monocytes: 7 %
Neutrophils Absolute: 7.4 10*3/uL — ABNORMAL HIGH (ref 1.4–7.0)
Neutrophils: 73 %
Platelets: 239 10*3/uL (ref 150–450)
RBC: 4.02 x10E6/uL (ref 3.77–5.28)
RDW: 12.6 % (ref 11.7–15.4)
RPR Ser Ql: NONREACTIVE
Rh Factor: POSITIVE
Rubella Antibodies, IGG: 8.89 index (ref >0.99)
WBC: 10.3 10*3/uL (ref 3.4–10.8)

## 2020-07-02 LAB — INTERPRETATION:

## 2020-07-02 LAB — HCV RNA NAA QUALITATIVE: HCV RNA NAA QUALITATIVE: POSITIVE — AB

## 2020-07-04 ENCOUNTER — Encounter: Payer: Self-pay | Admitting: Obstetrics

## 2020-07-05 ENCOUNTER — Encounter: Payer: Self-pay | Admitting: Obstetrics

## 2020-07-12 ENCOUNTER — Other Ambulatory Visit: Payer: Self-pay | Admitting: Obstetrics

## 2020-07-12 ENCOUNTER — Encounter: Payer: Self-pay | Admitting: *Deleted

## 2020-07-12 ENCOUNTER — Other Ambulatory Visit: Payer: Self-pay

## 2020-07-12 ENCOUNTER — Ambulatory Visit: Payer: Medicaid Other | Attending: Obstetrics

## 2020-07-12 ENCOUNTER — Encounter: Payer: PRIVATE HEALTH INSURANCE | Admitting: Obstetrics

## 2020-07-12 ENCOUNTER — Ambulatory Visit: Payer: Medicaid Other | Admitting: *Deleted

## 2020-07-12 VITALS — BP 107/74 | HR 113

## 2020-07-12 DIAGNOSIS — O09293 Supervision of pregnancy with other poor reproductive or obstetric history, third trimester: Secondary | ICD-10-CM | POA: Diagnosis not present

## 2020-07-12 DIAGNOSIS — O0990 Supervision of high risk pregnancy, unspecified, unspecified trimester: Secondary | ICD-10-CM | POA: Diagnosis not present

## 2020-07-12 DIAGNOSIS — O98413 Viral hepatitis complicating pregnancy, third trimester: Secondary | ICD-10-CM | POA: Diagnosis not present

## 2020-07-12 DIAGNOSIS — B182 Chronic viral hepatitis C: Secondary | ICD-10-CM

## 2020-07-12 DIAGNOSIS — Z3A33 33 weeks gestation of pregnancy: Secondary | ICD-10-CM

## 2020-07-12 DIAGNOSIS — Z3687 Encounter for antenatal screening for uncertain dates: Secondary | ICD-10-CM | POA: Diagnosis not present

## 2020-07-13 ENCOUNTER — Encounter: Payer: Self-pay | Admitting: Obstetrics

## 2020-07-13 ENCOUNTER — Ambulatory Visit (INDEPENDENT_AMBULATORY_CARE_PROVIDER_SITE_OTHER): Payer: PRIVATE HEALTH INSURANCE | Admitting: Obstetrics

## 2020-07-13 VITALS — Wt 171.3 lb

## 2020-07-13 DIAGNOSIS — F1990 Other psychoactive substance use, unspecified, uncomplicated: Secondary | ICD-10-CM

## 2020-07-13 DIAGNOSIS — O0933 Supervision of pregnancy with insufficient antenatal care, third trimester: Secondary | ICD-10-CM

## 2020-07-13 DIAGNOSIS — Z8759 Personal history of other complications of pregnancy, childbirth and the puerperium: Secondary | ICD-10-CM

## 2020-07-13 DIAGNOSIS — Z3A33 33 weeks gestation of pregnancy: Secondary | ICD-10-CM

## 2020-07-13 DIAGNOSIS — O99323 Drug use complicating pregnancy, third trimester: Secondary | ICD-10-CM

## 2020-07-13 DIAGNOSIS — O99613 Diseases of the digestive system complicating pregnancy, third trimester: Secondary | ICD-10-CM

## 2020-07-13 DIAGNOSIS — O0990 Supervision of high risk pregnancy, unspecified, unspecified trimester: Secondary | ICD-10-CM

## 2020-07-13 DIAGNOSIS — K219 Gastro-esophageal reflux disease without esophagitis: Secondary | ICD-10-CM

## 2020-07-13 MED ORDER — OMEPRAZOLE 20 MG PO CPDR: 20.0000 mg | DELAYED_RELEASE_CAPSULE | Freq: Two times a day (BID) | ORAL | 5 refills | Status: DC

## 2020-07-13 NOTE — Progress Notes (Signed)
Subjective:  [name] is a 29 y.o. [id number] at [redacted]w[redacted]d being seen today for ongoing prenatal care.  She is currently monitored for the following issues for this high-risk pregnancy and has Insufficient prenatal care in third trimester; Cardiac arrest (HCC); Overdose of opiate or related narcotic/unintentional; Hepatitis C virus infection without hepatic coma; Maternal substance abuse affecting newborn; NSVD (normal spontaneous vaginal delivery); and Supervision of other normal pregnancy, antepartum on their problem list.  Patient reports heartburn and nausea.  Contractions: Not present. Vag. Bleeding: None.  Movement: Present. Denies leaking of fluid.   The following portions of the patient's history were reviewed and updated as appropriate: allergies, current medications, past family history, past medical history, past social history, past surgical history and problem list. Problem list updated.  Objective:   Vitals:   07/13/20 1053  Weight: 171 lb 4.8 oz (77.7 kg)    Fetal Status:     Movement: Present     General:  Alert, oriented and cooperative. Patient is in no acute distress.  Skin: Skin is warm and dry. No rash noted.   Cardiovascular: Normal heart rate noted  Respiratory: Normal respiratory effort, no problems with respiration noted  Abdomen: Soft, gravid, appropriate for gestational age. Pain/Pressure: Absent     Pelvic:  Cervical exam deferred        Extremities: Normal range of motion.  Edema: Trace  Mental Status: Normal mood and affect. Normal behavior. Normal judgment and thought content.   Urinalysis:      Assessment and Plan:  Pregnancy: [id number] at [redacted]w[redacted]d  1. Supervision of high risk pregnancy, antepartum  2. Late prenatal care affecting pregnancy in third trimester  3. H/O severe pre-eclampsia  4. Substance abuse affecting pregnancy in third trimester, antepartum  5. GERD without esophagitis Rx: - omeprazole (PRILOSEC) 20 MG capsule; Take 1 capsule (20 mg  total) by mouth 2 (two) times daily before a meal.  Dispense: 60 capsule; Refill: 5   Preterm labor symptoms and general obstetric precautions including but not limited to vaginal bleeding, contractions, leaking of fluid and fetal movement were reviewed in detail with the patient. Please refer to After Visit Summary for other counseling recommendations.   Return in about 2 weeks (around 07/27/2020) for ROB.   [name], MD  07/13/20

## 2020-07-27 ENCOUNTER — Ambulatory Visit (INDEPENDENT_AMBULATORY_CARE_PROVIDER_SITE_OTHER): Payer: PRIVATE HEALTH INSURANCE | Admitting: Obstetrics and Gynecology

## 2020-07-27 ENCOUNTER — Encounter: Payer: Self-pay | Admitting: Obstetrics and Gynecology

## 2020-07-27 VITALS — BP 103/77 | HR 105

## 2020-07-27 DIAGNOSIS — Z3480 Encounter for supervision of other normal pregnancy, unspecified trimester: Secondary | ICD-10-CM

## 2020-07-27 DIAGNOSIS — B182 Chronic viral hepatitis C: Secondary | ICD-10-CM

## 2020-07-27 MED ORDER — PANTOPRAZOLE SODIUM 40 MG PO TBEC: 40.0000 mg | DELAYED_RELEASE_TABLET | Freq: Every day | ORAL | 1 refills | Status: AC

## 2020-07-27 NOTE — Progress Notes (Signed)
   PRENATAL VISIT NOTE  Subjective:  [name] is a 29 y.o. [id number] at [redacted]w[redacted]d being seen today for ongoing prenatal care.  She is currently monitored for the following issues for this high-risk pregnancy and has Insufficient prenatal care in third trimester; Cardiac arrest (HCC); Overdose of opiate or related narcotic/unintentional; Hepatitis C virus infection without hepatic coma; Maternal substance abuse affecting newborn; NSVD (normal spontaneous vaginal delivery); and Supervision of other normal pregnancy, antepartum on their problem list.  Patient reports heartburn.  Contractions: Not present. Vag. Bleeding: None.  Movement: Present. Denies leaking of fluid.   The following portions of the patient's history were reviewed and updated as appropriate: allergies, current medications, past family history, past medical history, past social history, past surgical history and problem list.   Objective:   Vitals:   07/27/20 1439  BP: 103/77  Pulse: (!) 105    Fetal Status: Fetal Heart Rate (bpm): 135 Fundal Height: 35 cm Movement: Present     General:  Alert, oriented and cooperative. Patient is in no acute distress.  Skin: Skin is warm and dry. No rash noted.   Cardiovascular: Normal heart rate noted  Respiratory: Normal respiratory effort, no problems with respiration noted  Abdomen: Soft, gravid, appropriate for gestational age.  Pain/Pressure: Present     Pelvic: Cervical exam deferred        Extremities: Normal range of motion.     Mental Status: Normal mood and affect. Normal behavior. Normal judgment and thought content.   Assessment and Plan:  Pregnancy: [id number] at [redacted]w[redacted]d 1. Supervision of other normal pregnancy, antepartum Patient is doing well without complaints Rx Protonix provided to assist with heartburn Cultures next visit Patient plans depo-provera for contraception   2. Chronic hepatitis C without hepatic coma (HCC) Previously seen by ID  Preterm labor symptoms  and general obstetric precautions including but not limited to vaginal bleeding, contractions, leaking of fluid and fetal movement were reviewed in detail with the patient. Please refer to After Visit Summary for other counseling recommendations.   Return in about 1 week (around 08/03/2020) for in person, ROB, cultures.  No future appointments.  Catalina Antigua, MD

## 2020-08-03 ENCOUNTER — Ambulatory Visit (INDEPENDENT_AMBULATORY_CARE_PROVIDER_SITE_OTHER): Payer: PRIVATE HEALTH INSURANCE | Admitting: Obstetrics and Gynecology

## 2020-08-03 ENCOUNTER — Other Ambulatory Visit (HOSPITAL_COMMUNITY)
Admission: RE | Admit: 2020-08-03 | Discharge: 2020-08-03 | Disposition: A | Discharge status: Home / Self Care | Source: Ambulatory Visit | Attending: Obstetrics and Gynecology | Admitting: Obstetrics and Gynecology

## 2020-08-03 ENCOUNTER — Encounter: Payer: Self-pay | Admitting: Obstetrics and Gynecology

## 2020-08-03 VITALS — BP 106/78 | HR 110

## 2020-08-03 DIAGNOSIS — Z3480 Encounter for supervision of other normal pregnancy, unspecified trimester: Secondary | ICD-10-CM | POA: Insufficient documentation

## 2020-08-03 DIAGNOSIS — O09299 Supervision of pregnancy with other poor reproductive or obstetric history, unspecified trimester: Secondary | ICD-10-CM

## 2020-08-03 DIAGNOSIS — B182 Chronic viral hepatitis C: Secondary | ICD-10-CM

## 2020-08-03 DIAGNOSIS — O0933 Supervision of pregnancy with insufficient antenatal care, third trimester: Secondary | ICD-10-CM

## 2020-08-03 DIAGNOSIS — Z8759 Personal history of other complications of pregnancy, childbirth and the puerperium: Secondary | ICD-10-CM

## 2020-08-03 DIAGNOSIS — Z3A36 36 weeks gestation of pregnancy: Secondary | ICD-10-CM

## 2020-08-03 NOTE — Progress Notes (Addendum)
° °  PRENATAL VISIT NOTE  Subjective:  [name] is a 29 y.o. [id number] at [redacted]w[redacted]d being seen today for ongoing prenatal care.  She is currently monitored for the following issues for this high-risk pregnancy and has Insufficient prenatal care in third trimester; Cardiac arrest (HCC); Overdose of opiate or related narcotic/unintentional; Hepatitis C virus infection without hepatic coma; Maternal substance abuse affecting newborn; Supervision of other normal pregnancy, antepartum; and History of pre-eclampsia in prior pregnancy, currently pregnant on their problem list.  Patient reports pressure.  Contractions: Not present. Vag. Bleeding: None.  Movement: Present. Denies leaking of fluid.   The following portions of the patient's history were reviewed and updated as appropriate: allergies, current medications, past family history, past medical history, past social history, past surgical history and problem list.   Objective:   Vitals:   08/03/20 1414  BP: 106/78  Pulse: (!) 110   Fetal Status: Fetal Heart Rate (bpm): 140   Movement: Present     General:  Alert, oriented and cooperative. Patient is in no acute distress.  Skin: Skin is warm and dry. No rash noted.   Cardiovascular: Normal heart rate noted  Respiratory: Normal respiratory effort, no problems with respiration noted  Abdomen: Soft, gravid, appropriate for gestational age.  Pain/Pressure: Present     Pelvic: Cervical exam performed in the presence of a chaperone        Extremities: Normal range of motion.     Mental Status: Normal mood and affect. Normal behavior. Normal judgment and thought content.   Assessment and Plan:  Pregnancy: [id number] at 100w3d  1. Supervision of other normal pregnancy, antepartum - Strep Gp B NAA - Cervicovaginal ancillary only( [place]) - pt has not had GTT, A1c today  2. Chronic hepatitis C without hepatic coma (HCC) To see ID post delivery  3. Late prenatal care affecting pregnancy in  third trimester  4. H/O severe pre-eclampsia BP normal today  5. [redacted] weeks gestation of pregnancy  6. History of pre-eclampsia in prior pregnancy, currently pregnant Normal today   Preterm labor symptoms and general obstetric precautions including but not limited to vaginal bleeding, contractions, leaking of fluid and fetal movement were reviewed in detail with the patient. Please refer to After Visit Summary for other counseling recommendations.   Return in about 1 week (around 08/10/2020) for in person, high OB.  No future appointments.  Conan Bowens, MD

## 2020-08-04 LAB — HEMOGLOBIN A1C
Est. average glucose Bld gHb Est-mCnc: 114 mg/dL
Hgb A1c MFr Bld: 5.6 % (ref 4.8–5.6)

## 2020-08-04 LAB — CERVICOVAGINAL ANCILLARY ONLY
Chlamydia: NEGATIVE
Comment: NEGATIVE
Comment: NORMAL
Neisseria Gonorrhea: NEGATIVE

## 2020-08-05 LAB — STREP GP B NAA: Strep Gp B NAA: POSITIVE — AB

## 2020-08-10 ENCOUNTER — Ambulatory Visit (INDEPENDENT_AMBULATORY_CARE_PROVIDER_SITE_OTHER): Payer: PRIVATE HEALTH INSURANCE | Admitting: Obstetrics and Gynecology

## 2020-08-10 ENCOUNTER — Encounter: Payer: Self-pay | Admitting: Obstetrics and Gynecology

## 2020-08-10 VITALS — BP 105/74 | HR 90 | Wt 177.0 lb

## 2020-08-10 DIAGNOSIS — O09299 Supervision of pregnancy with other poor reproductive or obstetric history, unspecified trimester: Secondary | ICD-10-CM

## 2020-08-10 DIAGNOSIS — I469 Cardiac arrest, cause unspecified: Secondary | ICD-10-CM

## 2020-08-10 DIAGNOSIS — Z3480 Encounter for supervision of other normal pregnancy, unspecified trimester: Secondary | ICD-10-CM

## 2020-08-10 DIAGNOSIS — B182 Chronic viral hepatitis C: Secondary | ICD-10-CM

## 2020-08-10 DIAGNOSIS — Z3A37 37 weeks gestation of pregnancy: Secondary | ICD-10-CM | POA: Insufficient documentation

## 2020-08-10 NOTE — Progress Notes (Signed)
Pt presents for ROB reports no complaints today.  

## 2020-08-10 NOTE — Patient Instructions (Signed)

## 2020-08-10 NOTE — Progress Notes (Signed)
   PRENATAL VISIT NOTE  Subjective:  [name] is a 29 y.o. [phone] at [redacted]w[redacted]d being seen today for ongoing prenatal care.  She is currently monitored for the following issues for this high-risk pregnancy and has Insufficient prenatal care in third trimester; Cardiac arrest (HCC); Overdose of opiate or related narcotic/unintentional; Hepatitis C virus infection without hepatic coma; Maternal substance abuse affecting newborn; Supervision of other normal pregnancy, antepartum; History of pre-eclampsia in prior pregnancy, currently pregnant; and [redacted] weeks gestation of pregnancy on their problem list.  Patient doing well with no acute concerns today. She reports no complaints.  Contractions: Not present. Vag. Bleeding: None.  Movement: Present. Denies leaking of fluid.   The following portions of the patient's history were reviewed and updated as appropriate: allergies, current medications, past family history, past medical history, past social history, past surgical history and problem list. Problem list updated.  Objective:   Vitals:   08/10/20 1347  BP: 105/74  Pulse: 90  Weight: 177 lb (80.3 kg)    Fetal Status: Fetal Heart Rate (bpm): 142 Fundal Height: 37 cm Movement: Present     General:  Alert, oriented and cooperative. Patient is in no acute distress.  Skin: Skin is warm and dry. No rash noted.   Cardiovascular: Normal heart rate noted  Respiratory: Normal respiratory effort, no problems with respiration noted  Abdomen: Soft, gravid, appropriate for gestational age.  Pain/Pressure: Present     Pelvic: Cervical exam deferred        Extremities: Normal range of motion.  Edema: Trace  Mental Status:  Normal mood and affect. Normal behavior. Normal judgment and thought content.   Assessment and Plan:  Pregnancy: [id number] at [redacted]w[redacted]d  1. [redacted] weeks gestation of pregnancy Discussed GBS positive status  2. Supervision of other normal pregnancy, antepartum Consider IOL after 40  weeks  3. Cardiac arrest [facility]) Drug overdose related  4. Chronic hepatitis C without hepatic coma (HCC) eval with ID after delivery  5. History of pre-eclampsia in prior pregnancy, currently pregnant Normal BP today  6. Maternal substance abuse affecting newborn UDS at time of admission.  Pt currently incarcerated  Term labor symptoms and general obstetric precautions including but not limited to vaginal bleeding, contractions, leaking of fluid and fetal movement were reviewed in detail with the patient.  Please refer to After Visit Summary for other counseling recommendations.   Return in about 1 week (around 08/17/2020) for [facility], in person.   Mariel Aloe, MD

## 2020-08-18 ENCOUNTER — Ambulatory Visit (INDEPENDENT_AMBULATORY_CARE_PROVIDER_SITE_OTHER): Payer: PRIVATE HEALTH INSURANCE | Admitting: Obstetrics & Gynecology

## 2020-08-18 ENCOUNTER — Other Ambulatory Visit: Payer: Self-pay | Admitting: Obstetrics & Gynecology

## 2020-08-18 VITALS — BP 118/79 | HR 78 | Wt 175.5 lb

## 2020-08-18 DIAGNOSIS — B182 Chronic viral hepatitis C: Secondary | ICD-10-CM

## 2020-08-18 DIAGNOSIS — Z3480 Encounter for supervision of other normal pregnancy, unspecified trimester: Secondary | ICD-10-CM

## 2020-08-18 DIAGNOSIS — O0933 Supervision of pregnancy with insufficient antenatal care, third trimester: Secondary | ICD-10-CM

## 2020-08-18 NOTE — Patient Instructions (Signed)

## 2020-08-18 NOTE — Progress Notes (Signed)
Patient presents for ROB. Patient would like to have her cervix checked today. She would like to discuss possibility of having an induction within the next week due to being extradited to [place] in the next 2-3 weeks. Patient has her plan for family to care for child here in [place] after delivery.

## 2020-08-19 ENCOUNTER — Other Ambulatory Visit: Payer: Self-pay | Admitting: Advanced Practice Midwife

## 2020-08-19 ENCOUNTER — Telehealth: Payer: Self-pay | Admitting: Obstetrics and Gynecology

## 2020-08-19 NOTE — Telephone Encounter (Signed)
IOL moved from 2345 on 10/2 to 10am on 10/3 per request from [place]. L&D aware  Cornelia Copa MD Attending Center for [employer] (Faculty Practice) 08/19/2020 Time: [id number]

## 2020-08-21 ENCOUNTER — Inpatient Hospital Stay (HOSPITAL_COMMUNITY)
Admission: AD | Admit: 2020-08-21 | Payer: PRIVATE HEALTH INSURANCE | Source: Home / Self Care | Admitting: Obstetrics & Gynecology

## 2020-08-21 ENCOUNTER — Encounter (HOSPITAL_COMMUNITY): Payer: Self-pay | Admitting: Anesthesiology

## 2020-08-21 ENCOUNTER — Encounter (HOSPITAL_COMMUNITY): Payer: Self-pay | Admitting: Obstetrics & Gynecology

## 2020-08-21 ENCOUNTER — Inpatient Hospital Stay (HOSPITAL_COMMUNITY)
Admission: AD | Admit: 2020-08-21 | Discharge: 2020-08-23 | DRG: 807 | Disposition: A | Discharge status: Home / Self Care | Attending: Obstetrics & Gynecology | Admitting: Obstetrics & Gynecology

## 2020-08-21 ENCOUNTER — Inpatient Hospital Stay (HOSPITAL_COMMUNITY): Payer: PRIVATE HEALTH INSURANCE | Attending: Obstetrics & Gynecology

## 2020-08-21 ENCOUNTER — Inpatient Hospital Stay (HOSPITAL_COMMUNITY): Payer: PRIVATE HEALTH INSURANCE

## 2020-08-21 ENCOUNTER — Other Ambulatory Visit: Payer: Self-pay

## 2020-08-21 DIAGNOSIS — B182 Chronic viral hepatitis C: Secondary | ICD-10-CM | POA: Diagnosis present

## 2020-08-21 DIAGNOSIS — Z20822 Contact with and (suspected) exposure to covid-19: Secondary | ICD-10-CM | POA: Diagnosis present

## 2020-08-21 DIAGNOSIS — O99824 Streptococcus B carrier state complicating childbirth: Secondary | ICD-10-CM | POA: Diagnosis present

## 2020-08-21 DIAGNOSIS — O9842 Viral hepatitis complicating childbirth: Principal | ICD-10-CM | POA: Diagnosis present

## 2020-08-21 DIAGNOSIS — Z3490 Encounter for supervision of normal pregnancy, unspecified, unspecified trimester: Secondary | ICD-10-CM

## 2020-08-21 DIAGNOSIS — Z3A39 39 weeks gestation of pregnancy: Secondary | ICD-10-CM | POA: Diagnosis not present

## 2020-08-21 DIAGNOSIS — O26893 Other specified pregnancy related conditions, third trimester: Secondary | ICD-10-CM | POA: Diagnosis present

## 2020-08-21 DIAGNOSIS — Z87891 Personal history of nicotine dependence: Secondary | ICD-10-CM | POA: Diagnosis not present

## 2020-08-21 LAB — PROTIME-INR
INR: 1 (ref 0.8–1.2)
Prothrombin Time: 12.4 seconds (ref 11.4–15.2)

## 2020-08-21 LAB — RAPID URINE DRUG SCREEN, HOSP PERFORMED
Amphetamines: NOT DETECTED
Barbiturates: NOT DETECTED
Benzodiazepines: NOT DETECTED
Cocaine: NOT DETECTED
Opiates: NOT DETECTED
Tetrahydrocannabinol: NOT DETECTED

## 2020-08-21 LAB — CBC
HCT: 35.1 % — ABNORMAL LOW (ref 36.0–46.0)
Hemoglobin: 11.7 g/dL — ABNORMAL LOW (ref 12.0–15.0)
MCH: 30.2 pg (ref 26.0–34.0)
MCHC: 33.3 g/dL (ref 30.0–36.0)
MCV: 90.5 fL (ref 80.0–100.0)
Platelets: 297 10*3/uL (ref 150–400)
RBC: 3.88 MIL/uL (ref 3.87–5.11)
RDW: 12.9 % (ref 11.5–15.5)
WBC: 10.4 10*3/uL (ref 4.0–10.5)
nRBC: 0 % (ref 0.0–0.2)

## 2020-08-21 LAB — RESPIRATORY PANEL BY RT PCR (FLU A&B, COVID)
Influenza A by PCR: NEGATIVE
Influenza B by PCR: NEGATIVE
SARS Coronavirus 2 by RT PCR: NEGATIVE

## 2020-08-21 LAB — TYPE AND SCREEN
ABO/RH(D): O POS
Antibody Screen: NEGATIVE

## 2020-08-21 MED ORDER — PHENYLEPHRINE 40 MCG/ML (10ML) SYRINGE FOR IV PUSH (FOR BLOOD PRESSURE SUPPORT): 80.0000 ug | PREFILLED_SYRINGE | INTRAVENOUS | Status: DC | PRN

## 2020-08-21 MED ORDER — SODIUM CHLORIDE 0.9 % IV SOLN
5.0000 10*6.[IU] | Freq: Once | INTRAVENOUS | Status: AC
  Administered 2020-08-21: 5 10*6.[IU] via INTRAVENOUS
  Filled 2020-08-21: qty 5

## 2020-08-21 MED ORDER — ONDANSETRON HCL 4 MG PO TABS: 4.0000 mg | ORAL_TABLET | ORAL | Status: DC | PRN

## 2020-08-21 MED ORDER — ONDANSETRON HCL 4 MG/2ML IJ SOLN: 4.0000 mg | Freq: Four times a day (QID) | INTRAMUSCULAR | Status: DC | PRN

## 2020-08-21 MED ORDER — EPHEDRINE 5 MG/ML INJ: 10.0000 mg | INTRAVENOUS | Status: DC | PRN

## 2020-08-21 MED ORDER — MISOPROSTOL 25 MCG QUARTER TABLET
ORAL_TABLET | ORAL | Status: AC
  Administered 2020-08-21: 25 ug
  Filled 2020-08-21: qty 1

## 2020-08-21 MED ORDER — DIBUCAINE (PERIANAL) 1 % EX OINT: 1.0000 "application " | TOPICAL_OINTMENT | CUTANEOUS | Status: DC | PRN

## 2020-08-21 MED ORDER — LACTATED RINGERS IV SOLN: INTRAVENOUS | Status: DC

## 2020-08-21 MED ORDER — OXYTOCIN 10 UNIT/ML IJ SOLN
10.0000 [IU] | Freq: Once | INTRAMUSCULAR | Status: AC
  Administered 2020-08-21: 10 [IU] via INTRAMUSCULAR

## 2020-08-21 MED ORDER — LACTATED RINGERS IV SOLN: 500.0000 mL | INTRAVENOUS | Status: DC | PRN

## 2020-08-21 MED ORDER — PRENATAL MULTIVITAMIN CH
1.0000 | ORAL_TABLET | Freq: Every day | ORAL | Status: DC
  Administered 2020-08-22 – 2020-08-23 (×2): 1 via ORAL
  Filled 2020-08-21 (×2): qty 1

## 2020-08-21 MED ORDER — SOD CITRATE-CITRIC ACID 500-334 MG/5ML PO SOLN: 30.0000 mL | ORAL | Status: DC | PRN

## 2020-08-21 MED ORDER — MAGNESIUM HYDROXIDE 400 MG/5ML PO SUSP: 30.0000 mL | ORAL | Status: DC | PRN

## 2020-08-21 MED ORDER — BENZOCAINE-MENTHOL 20-0.5 % EX AERO: 1.0000 "application " | INHALATION_SPRAY | CUTANEOUS | Status: DC | PRN

## 2020-08-21 MED ORDER — IBUPROFEN 600 MG PO TABS
600.0000 mg | ORAL_TABLET | Freq: Four times a day (QID) | ORAL | Status: DC
  Administered 2020-08-21 – 2020-08-23 (×7): 600 mg via ORAL
  Filled 2020-08-21 (×7): qty 1

## 2020-08-21 MED ORDER — ACETAMINOPHEN 325 MG PO TABS: 650.0000 mg | ORAL_TABLET | ORAL | Status: DC | PRN

## 2020-08-21 MED ORDER — LACTATED RINGERS IV SOLN: 500.0000 mL | Freq: Once | INTRAVENOUS | Status: DC

## 2020-08-21 MED ORDER — OXYTOCIN BOLUS FROM INFUSION: 333.0000 mL | Freq: Once | INTRAVENOUS | Status: DC

## 2020-08-21 MED ORDER — WITCH HAZEL-GLYCERIN EX PADS: 1.0000 "application " | MEDICATED_PAD | CUTANEOUS | Status: DC | PRN

## 2020-08-21 MED ORDER — COCONUT OIL OIL: 1.0000 "application " | TOPICAL_OIL | Status: DC | PRN

## 2020-08-21 MED ORDER — OXYTOCIN-SODIUM CHLORIDE 30-0.9 UT/500ML-% IV SOLN
1.0000 m[IU]/min | INTRAVENOUS | Status: DC
  Administered 2020-08-21: 2 m[IU]/min via INTRAVENOUS
  Filled 2020-08-21: qty 500

## 2020-08-21 MED ORDER — OXYTOCIN-SODIUM CHLORIDE 30-0.9 UT/500ML-% IV SOLN: 2.5000 [IU]/h | INTRAVENOUS | Status: DC

## 2020-08-21 MED ORDER — OXYCODONE-ACETAMINOPHEN 5-325 MG PO TABS: 2.0000 | ORAL_TABLET | ORAL | Status: DC | PRN

## 2020-08-21 MED ORDER — ACETAMINOPHEN 325 MG PO TABS
650.0000 mg | ORAL_TABLET | ORAL | Status: DC | PRN
  Administered 2020-08-22: 650 mg via ORAL
  Filled 2020-08-21: qty 2

## 2020-08-21 MED ORDER — OXYCODONE-ACETAMINOPHEN 5-325 MG PO TABS: 1.0000 | ORAL_TABLET | ORAL | Status: DC | PRN

## 2020-08-21 MED ORDER — DIPHENHYDRAMINE HCL 25 MG PO CAPS: 25.0000 mg | ORAL_CAPSULE | Freq: Four times a day (QID) | ORAL | Status: DC | PRN

## 2020-08-21 MED ORDER — DIPHENHYDRAMINE HCL 50 MG/ML IJ SOLN: 12.5000 mg | INTRAMUSCULAR | Status: DC | PRN

## 2020-08-21 MED ORDER — ONDANSETRON HCL 4 MG/2ML IJ SOLN: 4.0000 mg | INTRAMUSCULAR | Status: DC | PRN

## 2020-08-21 MED ORDER — FENTANYL-BUPIVACAINE-NACL 0.5-0.125-0.9 MG/250ML-% EP SOLN
12.0000 mL/h | EPIDURAL | Status: DC | PRN
  Filled 2020-08-21: qty 250

## 2020-08-21 MED ORDER — PENICILLIN G POT IN DEXTROSE 60000 UNIT/ML IV SOLN
3.0000 10*6.[IU] | INTRAVENOUS | Status: DC
  Administered 2020-08-21: 3 10*6.[IU] via INTRAVENOUS
  Filled 2020-08-21: qty 50

## 2020-08-21 MED ORDER — OXYTOCIN 10 UNIT/ML IJ SOLN
INTRAMUSCULAR | Status: AC
  Filled 2020-08-21: qty 1

## 2020-08-21 MED ORDER — FERROUS SULFATE 325 (65 FE) MG PO TABS: 325.0000 mg | ORAL_TABLET | Freq: Two times a day (BID) | ORAL | Status: DC

## 2020-08-21 MED ORDER — SIMETHICONE 80 MG PO CHEW: 80.0000 mg | CHEWABLE_TABLET | ORAL | Status: DC | PRN

## 2020-08-21 MED ORDER — LIDOCAINE HCL (PF) 1 % IJ SOLN: 30.0000 mL | INTRAMUSCULAR | Status: DC | PRN

## 2020-08-21 MED ORDER — TERBUTALINE SULFATE 1 MG/ML IJ SOLN: 0.2500 mg | Freq: Once | INTRAMUSCULAR | Status: DC | PRN

## 2020-08-21 NOTE — Progress Notes (Signed)
[name] is a 29 y.o. [id number] at [redacted]w[redacted]d   Subjective: Endorses awareness of contractions, coping very well. Planning epidural, declines pain interventions at this time  Objective: BP 111/78   Pulse 88   Temp 98.3 F (36.8 C) (Oral)   Resp 16   Ht 5\' 5"  (1.651 m)   Wt 79.9 kg   LMP 11/22/2019   BMI 29.30 kg/m  No intake/output data recorded. No intake/output data recorded.  FHT:  FHR: 130 bpm, variability: moderate,  accelerations:  Present,  decelerations:  Absent UC:   irregular, every 4-6 minutes SVE:   Dilation: 4.5 Effacement (%): 50 Station: 0 Exam by:: sam cnm  Labs: Lab Results  Component Value Date   WBC 10.4 08/21/2020   HGB 11.7 (L) 08/21/2020   HCT 35.1 (L) 08/21/2020   MCV 90.5 08/21/2020   PLT 297 08/21/2020    Assessment / Plan: --29 y.o. 37 at [redacted]w[redacted]d  --Cat I tracing --GBS +: PCN at 1202 and 1535 --S/p Cytotec x 1 and foley bulb --Foley bulb dislodged at 1530 --Initiate Pitocin 2 x 2 --Anticipate NSVD  1203, CNM 08/21/2020, 5:11 PM

## 2020-08-21 NOTE — Anesthesia Preprocedure Evaluation (Deleted)
Anesthesia Evaluation    Reviewed: Allergy & Precautions, Patient's Chart, lab work & pertinent test results  History of Anesthesia Complications Negative for: history of anesthetic complications  Airway        Dental   Pulmonary neg pulmonary ROS, former smoker,           Cardiovascular   H/o cardiac arrest in 2015 2/2 drug overdose   Neuro/Psych negative neurological ROS  negative psych ROS   GI/Hepatic negative GI ROS, (+)     substance abuse  marijuana use, methamphetamine use and IV drug use, Hepatitis -, C  Endo/Other  negative endocrine ROS  Renal/GU negative Renal ROS  negative genitourinary   Musculoskeletal negative musculoskeletal ROS (+) narcotic dependent  Abdominal   Peds  Hematology negative hematology ROS (+)   Anesthesia Other Findings Pt with chronic untreated Hepatitis C since at least 2015, platelet count normal, no recent INR  Reproductive/Obstetrics                           Anesthesia Physical Anesthesia Plan  ASA: III and emergent  Anesthesia Plan: Epidural   Post-op Pain Management:    Induction:   PONV Risk Score and Plan:   Airway Management Planned:   Additional Equipment:   Intra-op Plan:   Post-operative Plan:   Informed Consent:   Plan Discussed with:   Anesthesia Plan Comments:         Anesthesia Quick Evaluation

## 2020-08-21 NOTE — H&P (Addendum)
[name] is a 29 y.o. female [phone]  At [redacted]w[redacted]d presenting for eIOL.  Good fetal movement. Some cramping, mild in nature, intermittently for the last couple days. Denies vaginal bleeding or ROM. Endorses mucous plug came out yesterday. Is not currently and has not recently headaches, vision changes, dyspnea, or lower extremity edema.     Prenatal History/Complications: Currently incarcerated Late prenatal care affecting pregnancy in third trimester Chronic HCV GBS positive Substance abuse affecting pregnancy in third trimester  Acute hepatitis C virus infection without hepatic coma  She received her prenatal care at [facility]: 21.5, normal anatomy, breech, 456 g   OB History     Gravida  6   Para  4   Term  4   Preterm  0   AB  1   Living  4      SAB  0   TAB  1   Ectopic  0   Multiple  0   Live Births  4        Obstetric Comments  Hx of preeclampsia         Nursing Staff Provider  Office Location  [facility] Dating  LMP   Language  English Anatomy US    Flu Vaccine  declines Genetic Screen  NIPS:   AFP:   First Screen:  Quad:    TDaP vaccine  Declines  Hgb A1C or  GTT Early  Third trimester   COVID vaccine    LAB RESULTS   Rhogam   Blood Type O/Positive/-- (08/11 1017)   Feeding Plan Unsure - currently incarcerated  Antibody Negative (08/11 1017)  Contraception None  Rubella 8.89 (08/11 1017)  Circumcision Yes if boy  RPR Non Reactive (08/11 1017)   Pediatrician  Unsure currently incarcerated  HBsAg Negative (08/11 1017)   Support Person Mom  HCVAb Positive  Prenatal Classes no HIV Non Reactive (08/11 1017)     BTL Consent  GBS Positive/-- (09/15 0236)(positive  VBAC Consent  Pap 06/29/20    Hgb Electro  aa  BP Cuff Currently incarcerated  CF neg    SMA Neg     Waterbirth  [ ]  Class [ ]  Consent [ ]  CNM visit    Induction  [ ]  Orders Entered [ ] Foley Y/N    Past Medical History:  Diagnosis Date  . Bacterial infection   . Family  history of varicose veins   . FHx: hypertension   . FHx: scoliosis   . FHx: thyroid disease   . H/O varicella   . Heart murmur   . Hepatitis    hepatitis C- 2015, confirmed this year  . Hepatitis C   . History of drug use   . Hx: UTI (urinary tract infection) 2012  . No pertinent past medical history   . Postpartum eclampsia 08/2006   Past Surgical History:  Procedure Laterality Date  . NO PAST SURGERIES     Family History: family history includes Anemia in her mother; Depression in her maternal grandmother and mother; Heart disease in her maternal grandmother and paternal grandfather; Hypertension in her father. Social History:  reports that she quit smoking about 3 years ago. Her smoking use included cigarettes. She smoked 0.25 packs per day. She has never used smokeless tobacco. She reports previous alcohol use. She reports current drug use. Drugs: Marijuana and Methamphetamines.     Maternal Diabetes: No Genetic Screening: Normal Maternal Ultrasounds/Referrals: Normal Fetal Ultrasounds or other Referrals:  Referred to  Materal Fetal Medicine  Maternal Substance Abuse:  Yes:  Type: Smoker, Other: methamphetamines Significant Maternal Medications:  None Significant Maternal Lab Results:  Group B Strep positive and Other: Hep C positive Other Comments:  None  Review of Systems  Constitutional: Negative for fever.  Respiratory: Negative for shortness of breath.   Cardiovascular: Negative for chest pain, palpitations and leg swelling.  Gastrointestinal: Negative for abdominal pain.  Genitourinary: Positive for pelvic pain.  Neurological: Negative for tremors, weakness, light-headedness and headaches.   Maternal Medical History:  Reason for admission: Contractions.   Contractions: Onset was yesterday.   Frequency: rare.   Duration is approximately 2 minutes.   Perceived severity is mild.    Fetal activity: Perceived fetal activity is normal.   Last perceived fetal  movement was within the past hour.    Prenatal complications: Substance abuse.   No bleeding or IUGR.   Prenatal Complications - Diabetes: none.    Dilation: 1.5 Exam by:: Thalia Bloodgood, CNM Blood pressure 103/71, pulse 81, temperature 98.3 F (36.8 C), temperature source Oral, resp. rate 16, height 5\' 5"  (1.651 m), weight 79.9 kg, last menstrual period 11/22/2019, unknown if currently breastfeeding.   Maternal Exam:  Uterine Assessment: Contraction strength is mild.  Contraction frequency is rare.   Abdomen: Patient reports no abdominal tenderness.   Physical Exam Constitutional:      General: She is not in acute distress.    Appearance: Normal appearance. She is not toxic-appearing.  Cardiovascular:     Rate and Rhythm: Normal rate.  Pulmonary:     Effort: Pulmonary effort is normal.  Skin:    General: Skin is warm and dry.  Neurological:     Mental Status: She is alert and oriented to person, place, and time.  Psychiatric:        Mood and Affect: Mood normal.        Behavior: Behavior normal.     Prenatal labs: ABO, Rh: --/--/O POS (10/03 1051) Antibody: NEG (10/03 1051) Rubella: 8.89 (08/11 1017) RPR: Non Reactive (08/11 1017)  HBsAg: Negative (08/11 1017)  HIV: Non Reactive (08/11 1017)  GBS: Positive/-- (09/15 0236)   Assessment/Plan: #Pain control: Epidural #FWB: Cat I #ID: PCN for GBS (+) #MOF: TBD, currently incarcerated #MOC: TBD, currently incarcerated #Circ: desired #Pediatrician: TBD, currently incarcerated  04-20[birth year]2006 08/21/2020, 1:10 PM  Attestation of Supervision of Student:  I confirm that I have verified the information documented in the resident's note and that I have also personally reperformed the history, physical exam and all medical decision making activities.  I have verified that all services and findings are accurately documented in this student's note; and I agree with management and plan as outlined in the documentation. I have  also made any necessary editorial changes.  --Elective IOL, impending extradition to 10/21/2020 --Cat I tracing --Proven pelvis to 3620 g --Hx PEC, normotensive this pregnancy --IOL held for patient lunch --Cytotec #1 and foley balloon placed at 1242 (inflated to 60 mL) --Reevaluate in 4 hours --Anticipate NSVD  [place], CNM Center for [facility], [facility] 08/21/2020 1:25 PM

## 2020-08-21 NOTE — Discharge Instructions (Signed)

## 2020-08-21 NOTE — Discharge Summary (Signed)
Postpartum Discharge Summary    Patient Name: [name] DOB: 1991/01/26 MRN: [id number]  Date of admission: 08/21/2020 Delivery date:08/21/2020  Delivering provider: Darlina Rumpf  Date of discharge: 08/23/2020  Admitting diagnosis: Term pregnancy [Z34.90] Encounter for elective induction of labor [Z34.90] Intrauterine pregnancy: [redacted]w[redacted]d    Secondary diagnosis:  Active Problems:   Term pregnancy   Encounter for elective induction of labor  Additional problems: currently incarcerated, chronic hcv     Discharge diagnosis: Term Pregnancy Delivered                                              Post partum procedures:N/A Augmentation: Pitocin, Cytotec and IP Foley Complications: None  Hospital course: Onset of Labor With Vaginal Delivery      29y.o. yo [id number] 30w0das admitted in Latent Labor on 08/21/2020. Patient had an uncomplicated labor course as follows:  Membrane Rupture Time/Date: 7:35 PM ,08/21/2020   Delivery Method:Vaginal, Spontaneous  Episiotomy: None  Lacerations:  None  Patient had an uncomplicated postpartum course.  She is ambulating, tolerating a regular diet, passing flatus, and urinating well. Patient is discharged home in stable condition on 08/23/20.  Newborn Data: Birth date:08/21/2020  Birth time:7:43 PM  Gender:Female  Living status:Living  Apgars:9 ,9  Weight:3405 g   Magnesium Sulfate received: No BMZ received: No Rhophylac:N/A MMR:N/A T-DaP: declines Flu: No  Declines Transfusion:No  Physical exam  Vitals:   08/22/20 1515 08/22/20 2048 08/23/20 0550 08/23/20 0847  BP: 106/77 103/77 (!) 80/56 100/77  Pulse: 90 81 64   Resp: '18 17 18   ' Temp: 98.7 F (37.1 C) 98.5 F (36.9 C) 98.2 F (36.8 C)   TempSrc: Oral Oral Oral   SpO2: 99% 99% 97%   Weight:      Height:       General: alert, cooperative and no distress Lochia: appropriate Uterine Fundus: firm Incision: N/A DVT Evaluation: No evidence of DVT seen on physical  exam. Labs: Lab Results  Component Value Date   WBC 10.4 08/21/2020   HGB 11.7 (L) 08/21/2020   HCT 35.1 (L) 08/21/2020   MCV 90.5 08/21/2020   PLT 297 08/21/2020   CMP Latest Ref Rng & Units 04/23/2019  Glucose 70 - 99 mg/dL 93  BUN 6 - 20 mg/dL 25(H)  Creatinine 0.44 - 1.00 mg/dL 1.20(H)  Sodium 135 - 145 mmol/L 145  Potassium 3.5 - 5.1 mmol/L 3.4(L)  Chloride 98 - 111 mmol/L 110  CO2 22 - 32 mmol/L 22  Calcium 8.9 - 10.3 mg/dL 8.8(L)  Total Protein 6.5 - 8.1 g/dL 8.1  Total Bilirubin 0.3 - 1.2 mg/dL 0.6  Alkaline Phos 38 - 126 U/L 107  AST 15 - 41 U/L 69(H)  ALT 0 - 44 U/L 128(H)   EdFlavia Shippercore: Edinburgh Postnatal Depression Scale Screening Tool 08/23/2020  I have been able to laugh and see the funny side of things. 0  I have looked forward with enjoyment to things. 0  I have blamed myself unnecessarily when things went wrong. 0  I have been anxious or worried for no good reason. 0  I have felt scared or panicky for no good reason. 0  Things have been getting on top of me. 0  I have been so unhappy that I have had difficulty sleeping. 0  I have felt  sad or miserable. 0  I have been so unhappy that I have been crying. 0  The thought of harming myself has occurred to me. 0  Edinburgh Postnatal Depression Scale Total 0     After visit meds:  Allergies as of 08/23/2020   No Known Allergies     Medication List    STOP taking these medications   omeprazole 20 MG capsule Commonly known as: PriLOSEC     TAKE these medications   acetaminophen 325 MG tablet Commonly known as: Tylenol Take 2 tablets (650 mg total) by mouth every 4 (four) hours as needed (for pain scale < 4).   albuterol 108 (90 Base) MCG/ACT inhaler Commonly known as: VENTOLIN HFA Inhale 2 puffs into the lungs every 6 (six) hours as needed for wheezing or shortness of breath.   ferrous sulfate 325 (65 FE) MG tablet Take 1 tablet (325 mg total) by mouth every other day. Start taking on: August 24, 2020   ibuprofen 600 MG tablet Commonly known as: ADVIL Take 1 tablet (600 mg total) by mouth every 6 (six) hours.   pantoprazole 40 MG tablet Commonly known as: Protonix Take 1 tablet (40 mg total) by mouth daily.   PNV PO Take by mouth.        Discharge home in stable condition Infant Feeding: Bottle Infant Disposition: per CPS  Discharge instruction: per After Visit Summary and Postpartum booklet. Activity: Advance as tolerated. Pelvic rest for 6 weeks.  Diet: routine diet Future Appointments: Future Appointments  Date Time Provider [place]  09/19/2020 10:00 AM Woodroe Mode, MD [place] None   Follow up Visit:   Please schedule this patient for a In person postpartum visit in 4 weeks with the following provider: Any provider. Additional Postpartum F/U:Referral to Infectous Disease for Chronic Hep C  Low risk pregnancy complicated by: incarcerated patient, chronic hcv  Delivery mode:  Vaginal, Spontaneous  Anticipated Birth Control:  none   08/23/2020 Janet Berlin, MD

## 2020-08-22 LAB — RPR: RPR Ser Ql: NONREACTIVE

## 2020-08-22 MED ORDER — FERROUS SULFATE 325 (65 FE) MG PO TABS: 325.0000 mg | ORAL_TABLET | ORAL | Status: DC

## 2020-08-22 NOTE — Lactation Note (Signed)
This note was copied from a baby's chart. Lactation Consultation Note  Patient Name: [name] Date: 08/22/2020  Per RN, mom has decided to formula feed infant only she is no longer BF.    Maternal Data    Feeding Feeding Type: Bottle Fed - Formula Nipple Type: Slow - flow  LATCH Score                   Interventions    Lactation Tools Discussed/Used     Consult Status      Danelle Earthly 08/22/2020, 11:30 PM

## 2020-08-22 NOTE — Clinical Social Work Maternal (Addendum)
CLINICAL SOCIAL WORK MATERNAL/CHILD NOTE  Patient Details  Name: [name] MRN: [id number] Date of Birth: Jan 14, 1991  Date:  08/22/2020  Clinical Social Worker Initiating Note:  Cynthia Pilar, LCSW Date/Time: Initiated:  08/22/20/0854     Child's Name:  [name]   Biological Parents:  Mother, Father [name], [name] (02/22/1991))   Need for Interpreter:  None   Reason for Referral:  Current Substance Use/Substance Use During Pregnancy , Late or No Prenatal Care , Current CPS Involvement, Current Incarceration   Address:  [address]  [place], [place] [address]    Phone number:  [phone] (home)     Additional phone number: none   Household Members/Support Persons (HM/SP):   Household Member/Support Person 1, Household Member/Support Person 2   HM/SP Name Relationship DOB or Age  HM/SP -1  [name] MOB [birth year] 1989  HM/SP -2 [name] 11/21/69  HM/SP -3 [name] daughter 09/02/2009  HM/SP -4 [name] son 02/28/2012  HM/SP -5 [name] son 12/31/2013  HM/SP -6 [name]  Son  02/28/2017  HM/SP -7        HM/SP -8          Natural Supports (not living in the home):      Professional Supports: Other (Comment) (MOB currently incacerated.)   Employment: Unemployed   Type of Work: none   Education:  9 to 11 years   Homebound arranged: No  Financial Resources:  Self-Pay    Other Resources:    none reported.   Cultural/Religious Considerations Which May Impact Care:  none reported.  Strengths:  Pediatrician chosen, Home prepared for child , Compliance with medical plan    Psychotropic Medications:      MOB denied having any mental health hx when asked.    Pediatrician:    [place]  Pediatrician List:   [facility]  [facility]      Pediatrician Fax Number:    Risk Factors/Current  Problems:  Substance Use , DHHS Involvement , Legal Issues    Cognitive State:  Able to Concentrate , Alert , Insightful    Mood/Affect:  Interested , Relaxed , Comfortable , Calm    CSW Assessment: CSW consulted as MOB had LPNC, hx of substance use as well as MOB is currently incarcerated in the [facility]. CSW went to speak with MOB at bedside to address further need and concerns.   CSW congratulated MOB on the birth of infant. CSW advised MOB of the HIPPA policy and asked that officer wait outside the door while CSW spoke with MOB due to HIPPA. Officer expressed the inability to leave MOB's side. CSW asked MOB if it was okay for officer to remain in the room while CSW spoke with her in which MOB expressed that it would be fine. CSW understanding and advised MOB of CSW's role and the reason for CSW coming to speak with her. MOB expressed that she didn't get [facility] prior to 31 weeks due to "I didn't have Medicaid". CSW asked MOB if she was able to ever obtain Medicaid in which MOB expressed that she wasn't. CSW understanding and ask MOB how she was able to obtain care? MOB expressed that when she entered into jail "I started care then". CSW understanding and advised MOB of the hospital drug screen policy. CSW advised  MOB that there are two different drug screens that are performed, UDS and CDS. CSW advised MOB that infants UDS was negative however CSW would need to monitor infants CDS and make CPS report if positive. 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 MOB expressed that once her pregnancy was confirmed "I stopped". CSW expressed understanding of this and asked MOB when she thought her last use for these substances were and MOB only expressed "earlier in pregnancy". CSW understanding and reported to MOB that CSW would still follow CDS.  CSW inquired from [facility] on her incarceration. MOB reported that  she has been incarcerated since May 01, 2020. MOB expressed that she is planning to be extradited to [place] in the upcoming weeks. MOB expressed that once she gets to [place] "I am able to make bail and then can be releases". CSW asked MOB who would be caring for infant while she is away. MOB expressed that she doesn't have custody of her older children. MOB expressed that her mother Cynthia Eva) has custody of the children. MOB expressed that she lost custody in 2015-2016 but reports that she is still involved in children lives as "we all stay together I just dont have custody of them". CSW was advised by MOB that she wishes to have infant discharged to her mother when infant is ready to discharge. CSW inquired from [facility] on previous CPS hx in which MOB expressed she does have hx with [facility] CPS. MOB did not disclose what led to CPS involvement but only reported that she has a hx. CSW advised MOB That CSW would be making new [place] CPS report due to MOB not having custody of other children and current incarceration. MOB expressed that she understood and presented no further questions regardfing CPS report. MOB expressed that FOB is Cheral Marker (02/[birth year]91). CSW was advised that FOB is also currently incarcerated in the [facility]. CSW given no further details of information at this time on FOB.    CSW inquired from [facility] on her mental health. MOB expressed no current or previous mental health diagnosis. MOB expressed that her mother has all the needed items to care for current infant and expressed that MOB has a carseat for infant as well. CSW provided MOB with PPD education and encouraged MOB to use supports or allow officers to check in on her regarding PPD. MOB expressed understanding and expressed no other questions or needs at this time. MOB declined SI, HI and DV at this time.   CSW has made [facility] CPS report. MOB expressed and filled out "Discharge of infant to person other than  the Birth  Mother" form which has also been placed on infants chart. CSW will continue to monitor infants CDS and update/make new CPS report if warranted. Barriers to d/c.   CSW Plan/Description:  CSW Will Continue to Monitor Umbilical Cord Tissue Drug Screen Results and Make Report if Warranted, CSW Awaiting CPS Disposition Plan, Child Protective Service Report , Hospital Drug Screen Policy Information, Perinatal Mood and Anxiety Disorder (PMADs) Education, Sudden Infant Death Syndrome (SIDS) Education    Robb Matar, LCSWA 08/22/2020, 9:23 AM

## 2020-08-22 NOTE — Progress Notes (Signed)
[place] and was advised that [place] was screened out by [place]. CSW was notified that case was sent to [place] due to jurisdiction in which CSW was advised by J. Watkins with [place] CPS that since report was screened out by [place], no follow up would be initiated CSW understanding and advised per CPS no barriers to infant discharging to person MOB has identified.    Jameila Keeny S. Kassius Battiste, MSW, LCSW [facility] [place] [phone]   

## 2020-08-22 NOTE — Progress Notes (Signed)
Post Partum Day 1 Subjective: no complaints, up ad lib, voiding, tolerating PO and + flatus  Objective: Blood pressure 100/68, pulse 82, temperature 98.6 F (37 C), temperature source Oral, resp. rate 18, height 5\' 5"  (1.651 m), weight 79.9 kg, last menstrual period 11/22/2019, SpO2 100 %, unknown if currently breastfeeding.  Physical Exam:  General: alert, cooperative and no distress Lochia: appropriate Uterine Fundus: firm Incision: n/a DVT Evaluation: No evidence of DVT seen on physical exam.  Recent Labs    08/21/20 1051  HGB 11.7*  HCT 35.1*    Assessment/Plan: Plan for discharge tomorrow and Social Work consult due to current incarceration. Patient declining contraception at this time   LOS: 1 day   10/21/20 CNM 08/22/2020, 5:57 AM

## 2020-08-23 LAB — SURGICAL PATHOLOGY

## 2020-08-23 MED ORDER — FERROUS SULFATE 325 (65 FE) MG PO TABS: 325.0000 mg | ORAL_TABLET | ORAL | 3 refills | Status: AC

## 2020-08-23 MED ORDER — ACETAMINOPHEN 325 MG PO TABS: 650.0000 mg | ORAL_TABLET | ORAL | Status: DC | PRN

## 2020-08-23 MED ORDER — IBUPROFEN 600 MG PO TABS
600.0000 mg | ORAL_TABLET | Freq: Four times a day (QID) | ORAL | 0 refills | Status: DC
Start: 2020-08-23 — End: 2020-08-23

## 2020-08-23 MED ORDER — IBUPROFEN 600 MG PO TABS: 600.0000 mg | ORAL_TABLET | Freq: Three times a day (TID) | ORAL | 0 refills | Status: AC | PRN

## 2020-08-23 MED ORDER — ACETAMINOPHEN 325 MG PO TABS: 650.0000 mg | ORAL_TABLET | Freq: Four times a day (QID) | ORAL | 1 refills | Status: AC | PRN

## 2020-08-23 MED ORDER — FERROUS SULFATE 325 (65 FE) MG PO TABS: 325.0000 mg | ORAL_TABLET | ORAL | 3 refills | Status: DC

## 2020-08-23 NOTE — Progress Notes (Signed)
Patient states she wants to stay until infant gets circumcised then she will leave facility per protocol.

## 2020-08-23 NOTE — Progress Notes (Signed)
Patient left to facility with guard. Social work aware. Grandmother will be taking infant home. Teaching will be done with grandmother.

## 2020-08-23 NOTE — Progress Notes (Signed)
CSW updated that MOB will be discharging at 1pm today/ CSW spoke with MGM Trinkle Branson to advise her to be arrive at hospital at 2pm. MGM expressed that she would be here at 2 or a little after.CSW understanding and will escort MGM to nursery to get infant.      Jackye Dever S. Cayle Thunder, MSW, LCSW [facility] [place] [phone]  

## 2020-08-23 NOTE — Progress Notes (Signed)
Patient has no complaints at this time. Declined all shots.

## 2020-09-19 ENCOUNTER — Ambulatory Visit: Payer: Self-pay | Admitting: Obstetrics & Gynecology

## 2022-07-31 ENCOUNTER — Encounter: Payer: Self-pay | Admitting: Family

## 2022-07-31 ENCOUNTER — Encounter: Payer: Self-pay | Admitting: Internal Medicine

## 2022-08-03 ENCOUNTER — Telehealth: Payer: Self-pay

## 2022-08-03 ENCOUNTER — Other Ambulatory Visit (HOSPITAL_COMMUNITY): Payer: Self-pay

## 2022-08-03 NOTE — Telephone Encounter (Signed)
[facility] Patient Product/process development scientist completed.    The patient is insured through [employer] [facility] and has a $4.00 copay.  Medication will need a PA.  We will continue to follow to see if copay assistance is needed.  Clearance Coots, CPhT Specialty Pharmacy Patient [facility] Phone: [phone] Fax:  [phone]

## 2022-08-06 ENCOUNTER — Encounter: Payer: Self-pay | Admitting: Infectious Disease

## 2023-12-26 ENCOUNTER — Encounter (HOSPITAL_COMMUNITY): Payer: Self-pay

## 2023-12-26 ENCOUNTER — Inpatient Hospital Stay (HOSPITAL_COMMUNITY)
Admission: EM | Admit: 2023-12-26 | Discharge: 2023-12-29 | DRG: 915 | Disposition: A | Discharge status: Home / Self Care | Payer: Self-pay | Attending: Internal Medicine | Admitting: Internal Medicine

## 2023-12-26 ENCOUNTER — Other Ambulatory Visit: Payer: Self-pay

## 2023-12-26 DIAGNOSIS — K449 Diaphragmatic hernia without obstruction or gangrene: Secondary | ICD-10-CM | POA: Diagnosis present

## 2023-12-26 DIAGNOSIS — R112 Nausea with vomiting, unspecified: Secondary | ICD-10-CM | POA: Insufficient documentation

## 2023-12-26 DIAGNOSIS — R21 Rash and other nonspecific skin eruption: Secondary | ICD-10-CM | POA: Diagnosis present

## 2023-12-26 DIAGNOSIS — Z832 Family history of diseases of the blood and blood-forming organs and certain disorders involving the immune mechanism: Secondary | ICD-10-CM

## 2023-12-26 DIAGNOSIS — K651 Peritoneal abscess: Secondary | ICD-10-CM | POA: Diagnosis present

## 2023-12-26 DIAGNOSIS — Z87440 Personal history of urinary (tract) infections: Secondary | ICD-10-CM

## 2023-12-26 DIAGNOSIS — E872 Acidosis, unspecified: Secondary | ICD-10-CM | POA: Diagnosis present

## 2023-12-26 DIAGNOSIS — K219 Gastro-esophageal reflux disease without esophagitis: Secondary | ICD-10-CM | POA: Diagnosis present

## 2023-12-26 DIAGNOSIS — Z6821 Body mass index (BMI) 21.0-21.9, adult: Secondary | ICD-10-CM

## 2023-12-26 DIAGNOSIS — T782XXA Anaphylactic shock, unspecified, initial encounter: Principal | ICD-10-CM | POA: Diagnosis present

## 2023-12-26 DIAGNOSIS — E46 Unspecified protein-calorie malnutrition: Secondary | ICD-10-CM | POA: Diagnosis present

## 2023-12-26 DIAGNOSIS — Z8249 Family history of ischemic heart disease and other diseases of the circulatory system: Secondary | ICD-10-CM

## 2023-12-26 DIAGNOSIS — R197 Diarrhea, unspecified: Secondary | ICD-10-CM | POA: Insufficient documentation

## 2023-12-26 DIAGNOSIS — F17290 Nicotine dependence, other tobacco product, uncomplicated: Secondary | ICD-10-CM | POA: Diagnosis present

## 2023-12-26 DIAGNOSIS — R739 Hyperglycemia, unspecified: Secondary | ICD-10-CM | POA: Diagnosis present

## 2023-12-26 DIAGNOSIS — R55 Syncope and collapse: Secondary | ICD-10-CM | POA: Diagnosis present

## 2023-12-26 DIAGNOSIS — E8809 Other disorders of plasma-protein metabolism, not elsewhere classified: Secondary | ICD-10-CM | POA: Diagnosis present

## 2023-12-26 DIAGNOSIS — E860 Dehydration: Secondary | ICD-10-CM | POA: Diagnosis present

## 2023-12-26 DIAGNOSIS — R5381 Other malaise: Secondary | ICD-10-CM | POA: Diagnosis present

## 2023-12-26 DIAGNOSIS — A084 Viral intestinal infection, unspecified: Secondary | ICD-10-CM | POA: Diagnosis present

## 2023-12-26 DIAGNOSIS — I959 Hypotension, unspecified: Secondary | ICD-10-CM | POA: Insufficient documentation

## 2023-12-26 DIAGNOSIS — Z818 Family history of other mental and behavioral disorders: Secondary | ICD-10-CM

## 2023-12-26 NOTE — ED Triage Notes (Signed)
 EMS called out for syncope- upon arrival pt was laying on couch, says she felt hot and was jittery per EMS- family has been sick, pt had hives to extremities per EMS, pt was positive for orthostatic hypotension- last BP 63/44 per EMS. Pt reports abd cramping, syncopal episode, vomited yesterday, diarrhea today, pt alert and oriented x 4. Pt endorsed eating THC gummie.

## 2023-12-27 ENCOUNTER — Emergency Department (HOSPITAL_COMMUNITY): Payer: Self-pay

## 2023-12-27 ENCOUNTER — Inpatient Hospital Stay (HOSPITAL_COMMUNITY): Payer: Self-pay

## 2023-12-27 ENCOUNTER — Other Ambulatory Visit (HOSPITAL_COMMUNITY): Payer: Self-pay | Admitting: *Deleted

## 2023-12-27 ENCOUNTER — Encounter (HOSPITAL_COMMUNITY): Payer: Self-pay | Admitting: Internal Medicine

## 2023-12-27 DIAGNOSIS — E861 Hypovolemia: Secondary | ICD-10-CM

## 2023-12-27 DIAGNOSIS — I959 Hypotension, unspecified: Secondary | ICD-10-CM | POA: Insufficient documentation

## 2023-12-27 DIAGNOSIS — E8809 Other disorders of plasma-protein metabolism, not elsewhere classified: Secondary | ICD-10-CM

## 2023-12-27 DIAGNOSIS — R197 Diarrhea, unspecified: Secondary | ICD-10-CM | POA: Insufficient documentation

## 2023-12-27 DIAGNOSIS — T782XXA Anaphylactic shock, unspecified, initial encounter: Secondary | ICD-10-CM | POA: Diagnosis present

## 2023-12-27 DIAGNOSIS — R55 Syncope and collapse: Secondary | ICD-10-CM

## 2023-12-27 DIAGNOSIS — K219 Gastro-esophageal reflux disease without esophagitis: Secondary | ICD-10-CM | POA: Insufficient documentation

## 2023-12-27 DIAGNOSIS — E46 Unspecified protein-calorie malnutrition: Secondary | ICD-10-CM | POA: Insufficient documentation

## 2023-12-27 DIAGNOSIS — R112 Nausea with vomiting, unspecified: Secondary | ICD-10-CM | POA: Insufficient documentation

## 2023-12-27 DIAGNOSIS — R739 Hyperglycemia, unspecified: Secondary | ICD-10-CM | POA: Insufficient documentation

## 2023-12-27 LAB — MRSA NEXT GEN BY PCR, NASAL: MRSA by PCR Next Gen: DETECTED — AB

## 2023-12-27 LAB — COMPREHENSIVE METABOLIC PANEL
ALT: 70 U/L — ABNORMAL HIGH (ref 0–44)
ALT: 72 U/L — ABNORMAL HIGH (ref 0–44)
AST: 43 U/L — ABNORMAL HIGH (ref 15–41)
AST: 45 U/L — ABNORMAL HIGH (ref 15–41)
Albumin: 2.8 g/dL — ABNORMAL LOW (ref 3.5–5.0)
Albumin: 3 g/dL — ABNORMAL LOW (ref 3.5–5.0)
Alkaline Phosphatase: 67 U/L (ref 38–126)
Alkaline Phosphatase: 71 U/L (ref 38–126)
Anion gap: 10 (ref 5–15)
Anion gap: 12 (ref 5–15)
BUN: 12 mg/dL (ref 6–20)
BUN: 22 mg/dL — ABNORMAL HIGH (ref 6–20)
CO2: 20 mmol/L — ABNORMAL LOW (ref 22–32)
CO2: 23 mmol/L (ref 22–32)
Calcium: 8 mg/dL — ABNORMAL LOW (ref 8.9–10.3)
Calcium: 8.1 mg/dL — ABNORMAL LOW (ref 8.9–10.3)
Chloride: 104 mmol/L (ref 98–111)
Chloride: 109 mmol/L (ref 98–111)
Creatinine, Ser: 0.74 mg/dL (ref 0.44–1.00)
Creatinine, Ser: 1.1 mg/dL — ABNORMAL HIGH (ref 0.44–1.00)
GFR, Estimated: >60 mL/min (ref >60)
GFR, Estimated: >60 mL/min (ref >60)
Glucose, Bld: 146 mg/dL — ABNORMAL HIGH (ref 70–99)
Glucose, Bld: 176 mg/dL — ABNORMAL HIGH (ref 70–99)
Potassium: 3.5 mmol/L (ref 3.5–5.1)
Potassium: 3.5 mmol/L (ref 3.5–5.1)
Sodium: 139 mmol/L (ref 135–145)
Sodium: 139 mmol/L (ref 135–145)
Total Bilirubin: 0.5 mg/dL (ref 0.0–1.2)
Total Bilirubin: 0.6 mg/dL (ref 0.0–1.2)
Total Protein: 5.7 g/dL — ABNORMAL LOW (ref 6.5–8.1)
Total Protein: 6.1 g/dL — ABNORMAL LOW (ref 6.5–8.1)

## 2023-12-27 LAB — PHOSPHORUS: Phosphorus: 3.4 mg/dL (ref 2.5–4.6)

## 2023-12-27 LAB — CBC
HCT: 41.1 % (ref 36.0–46.0)
HCT: 42.7 % (ref 36.0–46.0)
Hemoglobin: 13.4 g/dL (ref 12.0–15.0)
Hemoglobin: 13.9 g/dL (ref 12.0–15.0)
MCH: 30.3 pg (ref 26.0–34.0)
MCH: 30.5 pg (ref 26.0–34.0)
MCHC: 32.6 g/dL (ref 30.0–36.0)
MCHC: 32.6 g/dL (ref 30.0–36.0)
MCV: 93 fL (ref 80.0–100.0)
MCV: 93.4 fL (ref 80.0–100.0)
Platelets: 199 10*3/uL (ref 150–400)
Platelets: 244 10*3/uL (ref 150–400)
RBC: 4.4 MIL/uL (ref 3.87–5.11)
RBC: 4.59 MIL/uL (ref 3.87–5.11)
RDW: 12 % (ref 11.5–15.5)
RDW: 12.1 % (ref 11.5–15.5)
WBC: 12.3 10*3/uL — ABNORMAL HIGH (ref 4.0–10.5)
WBC: 23.4 10*3/uL — ABNORMAL HIGH (ref 4.0–10.5)
nRBC: 0 % (ref 0.0–0.2)
nRBC: 0 % (ref 0.0–0.2)

## 2023-12-27 LAB — URINALYSIS, ROUTINE W REFLEX MICROSCOPIC
Bacteria, UA: NONE SEEN
Bilirubin Urine: NEGATIVE
Glucose, UA: NEGATIVE mg/dL
Hgb urine dipstick: NEGATIVE
Ketones, ur: NEGATIVE mg/dL
Leukocytes,Ua: NEGATIVE
Nitrite: NEGATIVE
Protein, ur: 30 mg/dL — AB
Specific Gravity, Urine: >1.046 — ABNORMAL HIGH (ref 1.005–1.030)
pH: 5 (ref 5.0–8.0)

## 2023-12-27 LAB — ECHOCARDIOGRAM COMPLETE
Area-P 1/2: 4.31 cm2
Height: 65 in
S' Lateral: 2 cm
Weight: 2130.53 [oz_av]

## 2023-12-27 LAB — LACTIC ACID, PLASMA
Lactic Acid, Venous: 0.9 mmol/L (ref 0.5–1.9)
Lactic Acid, Venous: 1.4 mmol/L (ref 0.5–1.9)

## 2023-12-27 LAB — MAGNESIUM: Magnesium: 1.8 mg/dL (ref 1.7–2.4)

## 2023-12-27 LAB — TROPONIN I (HIGH SENSITIVITY)
Troponin I (High Sensitivity): <2 ng/L (ref <18)
Troponin I (High Sensitivity): <2 ng/L (ref <18)

## 2023-12-27 LAB — HEMOGLOBIN A1C
Hgb A1c MFr Bld: 5.1 % (ref 4.8–5.6)
Mean Plasma Glucose: 99.67 mg/dL

## 2023-12-27 LAB — HIV ANTIBODY (ROUTINE TESTING W REFLEX): HIV Screen 4th Generation wRfx: NONREACTIVE

## 2023-12-27 LAB — ETHANOL: Alcohol, Ethyl (B): <10 mg/dL (ref <10)

## 2023-12-27 LAB — LIPASE, BLOOD: Lipase: 24 U/L (ref 11–51)

## 2023-12-27 LAB — HCG, SERUM, QUALITATIVE: Preg, Serum: NEGATIVE

## 2023-12-27 MED ORDER — ONDANSETRON HCL 4 MG PO TABS: 4.0000 mg | ORAL_TABLET | Freq: Four times a day (QID) | ORAL | Status: DC | PRN

## 2023-12-27 MED ORDER — CHLORHEXIDINE GLUCONATE CLOTH 2 % EX PADS
6.0000 | MEDICATED_PAD | Freq: Every day | CUTANEOUS | Status: DC
  Administered 2023-12-27 – 2023-12-28 (×2): 6 via TOPICAL

## 2023-12-27 MED ORDER — SODIUM CHLORIDE 0.9 % IV SOLN: 250.0000 mL | INTRAVENOUS | Status: AC

## 2023-12-27 MED ORDER — ACETAMINOPHEN 650 MG RE SUPP: 650.0000 mg | Freq: Four times a day (QID) | RECTAL | Status: DC | PRN

## 2023-12-27 MED ORDER — LACTATED RINGERS IV BOLUS
500.0000 mL | Freq: Once | INTRAVENOUS | Status: AC
  Administered 2023-12-27: 500 mL via INTRAVENOUS

## 2023-12-27 MED ORDER — EPINEPHRINE 0.3 MG/0.3ML IJ SOAJ: 0.3000 mg | Freq: Once | INTRAMUSCULAR | Status: DC | PRN

## 2023-12-27 MED ORDER — FAMOTIDINE IN NACL 20-0.9 MG/50ML-% IV SOLN
20.0000 mg | Freq: Once | INTRAVENOUS | Status: AC
  Administered 2023-12-27: 20 mg via INTRAVENOUS
  Filled 2023-12-27: qty 50

## 2023-12-27 MED ORDER — IOHEXOL 300 MG/ML  SOLN
100.0000 mL | Freq: Once | INTRAMUSCULAR | Status: AC | PRN
  Administered 2023-12-27: 100 mL via INTRAVENOUS

## 2023-12-27 MED ORDER — METHYLPREDNISOLONE SODIUM SUCC 125 MG IJ SOLR
125.0000 mg | Freq: Once | INTRAMUSCULAR | Status: AC
  Administered 2023-12-27: 125 mg via INTRAVENOUS
  Filled 2023-12-27: qty 2

## 2023-12-27 MED ORDER — SODIUM CHLORIDE 0.9 % IV BOLUS
1000.0000 mL | Freq: Once | INTRAVENOUS | Status: AC
  Administered 2023-12-27: 1000 mL via INTRAVENOUS

## 2023-12-27 MED ORDER — ONDANSETRON HCL 4 MG/2ML IJ SOLN
4.0000 mg | Freq: Four times a day (QID) | INTRAMUSCULAR | Status: DC | PRN
Start: 2023-12-27 — End: 2023-12-29

## 2023-12-27 MED ORDER — ONDANSETRON HCL 4 MG/2ML IJ SOLN
4.0000 mg | Freq: Once | INTRAMUSCULAR | Status: AC
  Administered 2023-12-27: 4 mg via INTRAVENOUS
  Filled 2023-12-27: qty 2

## 2023-12-27 MED ORDER — LACTATED RINGERS IV SOLN: INTRAVENOUS | Status: AC

## 2023-12-27 MED ORDER — METHYLPREDNISOLONE SODIUM SUCC 125 MG IJ SOLR
INTRAMUSCULAR | Status: AC
  Filled 2023-12-27: qty 2

## 2023-12-27 MED ORDER — SODIUM CHLORIDE 0.9 % IV SOLN
2.0000 g | Freq: Once | INTRAVENOUS | Status: AC
  Administered 2023-12-27: 2 g via INTRAVENOUS
  Filled 2023-12-27: qty 20

## 2023-12-27 MED ORDER — ENOXAPARIN SODIUM 40 MG/0.4ML IJ SOSY: 40.0000 mg | PREFILLED_SYRINGE | INTRAMUSCULAR | Status: DC

## 2023-12-27 MED ORDER — ACETAMINOPHEN 325 MG PO TABS
650.0000 mg | ORAL_TABLET | Freq: Four times a day (QID) | ORAL | Status: DC | PRN
  Administered 2023-12-28: 650 mg via ORAL
  Filled 2023-12-27: qty 2

## 2023-12-27 MED ORDER — NOREPINEPHRINE 4 MG/250ML-% IV SOLN
2.0000 ug/min | INTRAVENOUS | Status: DC
  Administered 2023-12-27: 2 ug/min via INTRAVENOUS
  Administered 2023-12-27: 6 ug/min via INTRAVENOUS
  Filled 2023-12-27 (×2): qty 250

## 2023-12-27 MED ORDER — DIPHENHYDRAMINE HCL 50 MG/ML IJ SOLN: 25.0000 mg | Freq: Once | INTRAMUSCULAR | Status: DC

## 2023-12-27 MED ORDER — DIPHENHYDRAMINE HCL 50 MG/ML IJ SOLN
50.0000 mg | Freq: Once | INTRAMUSCULAR | Status: AC
  Administered 2023-12-27: 50 mg via INTRAVENOUS
  Filled 2023-12-27: qty 1

## 2023-12-27 MED ORDER — METRONIDAZOLE 500 MG/100ML IV SOLN
500.0000 mg | Freq: Once | INTRAVENOUS | Status: AC
  Administered 2023-12-27: 500 mg via INTRAVENOUS
  Filled 2023-12-27: qty 100

## 2023-12-27 MED ORDER — DIPHENHYDRAMINE HCL 25 MG PO CAPS: 25.0000 mg | ORAL_CAPSULE | Freq: Four times a day (QID) | ORAL | Status: DC | PRN

## 2023-12-27 MED ORDER — MIDODRINE HCL 5 MG PO TABS
5.0000 mg | ORAL_TABLET | Freq: Three times a day (TID) | ORAL | Status: DC
  Administered 2023-12-27 – 2023-12-29 (×6): 5 mg via ORAL
  Filled 2023-12-27 (×6): qty 1

## 2023-12-27 MED ORDER — MAGNESIUM SULFATE 2 GM/50ML IV SOLN
2.0000 g | Freq: Once | INTRAVENOUS | Status: AC
  Administered 2023-12-27: 2 g via INTRAVENOUS
  Filled 2023-12-27: qty 50

## 2023-12-27 MED ORDER — EPINEPHRINE 0.3 MG/0.3ML IJ SOAJ: 0.3000 mg | Freq: Once | INTRAMUSCULAR | Status: AC

## 2023-12-27 MED ORDER — EPINEPHRINE 0.3 MG/0.3ML IJ SOAJ
INTRAMUSCULAR | Status: AC
  Administered 2023-12-27: 0.3 mg via INTRAMUSCULAR
  Filled 2023-12-27: qty 0.3

## 2023-12-27 MED ORDER — PANTOPRAZOLE SODIUM 40 MG PO TBEC
40.0000 mg | DELAYED_RELEASE_TABLET | Freq: Every day | ORAL | Status: DC
  Administered 2023-12-27 – 2023-12-28 (×2): 40 mg via ORAL
  Filled 2023-12-27 (×2): qty 1

## 2023-12-27 NOTE — H&P (Addendum)
 History and Physical    Patient: [name] FMW:[id number] DOB: December 20, 1990 DOA: 12/26/2023 DOS: the patient was seen and examined on 12/27/2023 PCP: Patient, No Pcp Per  Patient coming from: Home  Chief Complaint:  Chief Complaint  Patient presents with   Allergic Reaction   HPI: [name] is a 33 y.o. female with medical history significant of GERD who presents to the emergency department from home via EMS due to complaint of allergic reaction.  Patient states that he called family abdomen experiencing GI illness, she complained of 2-day onset of nausea, diarrhea and poor oral intake due to poor intolerance.  She felt better today, so she tried to eat some pistachio nuts, shortly after this, she developed nausea, vomiting, rash, malaise and fatigue.  EMS was activated and on arrival of EMS team, BP was noted to be hypotensive at 63/44, she complained of abdominal cramping and vomiting yesterday.  Patient was taken to the ED for further evaluation and management.  ED Course:  In the emergency department, BP on arrival to the ED was 69/51, respiratory rate was 23/min, but other vital signs were within normal range.  Workup in the ED showed normal CBC except for WBC of 23.4.  BMP showed sodium 139, potassium 3.5, chloride 109, bicarb 20, blood glucose 176, BUN 22, creatinine 1.10, albumin 2.8, calcium  8.0, AST 43, ALT 72, ALP 71.  Lipase 24, pregnancy test was negative.  Blood culture pending CT abdomen and pelvis with contrast showed Small bowel within the mid and lower abdomen which appear to approach the upper limit of normal caliber. While this may be transient in nature, sequelae associated with an early small-bowel obstruction or early ileus cannot completely be excluded. Small to moderate sized hiatal hernia. Patient was initially thought to have abdominal infection, so she was empirically started on IV ceftriaxone  and metronidazole .  It was later realized that patient symptoms could  have been due to viral illness (considering their family also have been having diarrhea) and anaphylactic shock. She was treated with IV hydration.  EMS team already gave 1 dose of EpiPen  on the field, another shot of EpiPen  was provided in the ED, Pepcid , Benadryl , episil Medrol  25 mg x 1 was given.  Zofran  was also given due to nausea and vomiting. Hospitalist was asked to admit patient for further evaluation and management.   Review of Systems: Review of systems as noted in the HPI. All other systems reviewed and are negative.   Past Medical History:  Diagnosis Date   Bacterial infection    Family history of varicose veins    FHx: hypertension    FHx: scoliosis    FHx: thyroid disease    H/O varicella    Heart murmur    Hepatitis    hepatitis C- 2015, confirmed this year   Hepatitis C    History of drug use    Hx: UTI (urinary tract infection) 2012   Nausea & vomiting 12/27/2023   No pertinent past medical history    Postpartum eclampsia 08/2006   Past Surgical History:  Procedure Laterality Date   NO PAST SURGERIES      Social History:  reports that she quit smoking about 7 years ago. Her smoking use included cigarettes. She has never used smokeless tobacco. She reports current alcohol use. She reports current drug use. Drugs: Marijuana and Methamphetamines.   No Known Allergies  Family History  Problem Relation Age of Onset   Anemia Mother  Depression Mother    Hypertension Father    Heart disease Maternal Grandmother    Depression Maternal Grandmother    Heart disease Paternal Grandfather    Anesthesia problems Neg Hx      Prior to Admission medications   Medication Sig Start Date End Date Taking? Authorizing Provider  acetaminophen  (TYLENOL ) 325 MG tablet Take 2 tablets (650 mg total) by mouth every 6 (six) hours as needed (for pain scale < 4). 08/23/20   Myrl Therisa BRAVO, MD  albuterol  (PROVENTIL  HFA;VENTOLIN  HFA) 108 (90 Base) MCG/ACT inhaler Inhale 2 puffs  into the lungs every 6 (six) hours as needed for wheezing or shortness of breath. Patient not taking: Reported on 08/18/2020 11/21/16   Rudy Carlin LABOR, MD  ferrous sulfate  325 (65 FE) MG tablet Take 1 tablet (325 mg total) by mouth every other day. 08/24/20   Myrl Therisa BRAVO, MD  ibuprofen  (ADVIL ) 600 MG tablet Take 1 tablet (600 mg total) by mouth every 8 (eight) hours as needed. 08/23/20   Myrl Therisa BRAVO, MD  pantoprazole  (PROTONIX ) 40 MG tablet Take 1 tablet (40 mg total) by mouth daily. 07/27/20   Constant, Peggy, MD  Prenatal Vit w/Fe-Methylfol-FA (PNV PO) Take by mouth.    [provider]    Physical Exam: BP (!) 89/56   Pulse 82   Temp 97.9 F (36.6 C) (Axillary)   Resp 17   Ht 5' 5 (1.651 m)   Wt 59 kg   LMP 12/18/2023 (Approximate)   SpO2 96%   BMI 21.63 kg/m   General: 33 y.o. year-old female ill appearing but in no acute distress.  Alert and oriented x3. HEENT: NCAT, EOMI Neck: Supple, trachea medial Cardiovascular: Regular rate and rhythm with no rubs or gallops.  No thyromegaly or JVD noted.  No lower extremity edema. 2/4 pulses in all 4 extremities. Respiratory: Clear to auscultation with no wheezes or rales. Good inspiratory effort. Abdomen: Soft, nontender nondistended with normal bowel sounds x4 quadrants. Muskuloskeletal: No cyanosis, clubbing or edema noted bilaterally Neuro: CN II-XII intact, strength 5/5 x 4, sensation, reflexes intact Skin: No ulcerative lesions noted or rashes Psychiatry: Judgement and insight appear normal. Mood is appropriate for condition and setting          Labs on Admission:  Basic Metabolic Panel: Recent Labs  Lab 12/27/23 0016 12/27/23 0305  NA 139  --   K 3.5  --   CL 109  --   CO2 20*  --   GLUCOSE 176*  --   BUN 22*  --   CREATININE 1.10*  --   CALCIUM  8.0*  --   MG  --  1.8  PHOS  --  3.4   Liver Function Tests: Recent Labs  Lab 12/27/23 0016  AST 43*  ALT 72*  ALKPHOS 71  BILITOT 0.6  PROT 5.7*   ALBUMIN 2.8*   Recent Labs  Lab 12/27/23 0016  LIPASE 24   No results for input(s): AMMONIA in the last 168 hours. CBC: Recent Labs  Lab 12/27/23 0016  WBC 23.4*  HGB 13.9  HCT 42.7  MCV 93.0  PLT 244   Cardiac Enzymes: No results for input(s): CKTOTAL, CKMB, CKMBINDEX, TROPONINI in the last 168 hours.  BNP (last 3 results) No results for input(s): BNP in the last 8760 hours.  ProBNP (last 3 results) No results for input(s): PROBNP in the last 8760 hours.  CBG: No results for input(s): GLUCAP in the last 168 hours.  Radiological Exams on Admission: CT ABDOMEN PELVIS W CONTRAST Result Date: 12/27/2023 CLINICAL DATA:  Cramping with syncopal episode, vomiting and diarrhea. EXAM: CT ABDOMEN AND PELVIS WITH CONTRAST TECHNIQUE: Multidetector CT imaging of the abdomen and pelvis was performed using the standard protocol following bolus administration of intravenous contrast. RADIATION DOSE REDUCTION: This exam was performed according to the departmental dose-optimization program which includes automated exposure control, adjustment of the mA and/or kV according to patient size and/or use of iterative reconstruction technique. CONTRAST:  OMNIPAQUE  IOHEXOL  300 MG/ML  SOLN COMPARISON:  None Available. FINDINGS: Lower chest: No acute abnormality. Hepatobiliary: No focal liver abnormality is seen. No gallstones, gallbladder wall thickening, or biliary dilatation. Pancreas: Unremarkable. No pancreatic ductal dilatation or surrounding inflammatory changes. Spleen: Normal in size without focal abnormality. Adrenals/Urinary Tract: Adrenal glands are unremarkable. Kidneys are normal, without renal calculi, focal lesion, or hydronephrosis. Bladder is unremarkable. Stomach/Bowel: There is a small to moderate sized hiatal hernia. Appendix appears normal. Several air-filled loops of small bowel within the mid and lower abdomen appear to approach the upper limit of normal caliber. A  clear transition zone is not identified. Vascular/Lymphatic: No significant vascular findings are present. No enlarged abdominal or pelvic lymph nodes. Reproductive: Uterus and bilateral adnexa are unremarkable. Other: There is a 2.0 cm x 1.5 cm x 2.6 cm fat containing right-sided para umbilical hernia. A small amount of posterior pelvic free fluid is seen, likely physiologic. Musculoskeletal: No acute or significant osseous findings. IMPRESSION: 1. Small bowel within the mid and lower abdomen which appear to approach the upper limit of normal caliber. While this may be transient in nature, sequelae associated with an early small-bowel obstruction or early ileus cannot completely be excluded. 2. Small to moderate sized hiatal hernia. Electronically Signed   By: Suzen Dials M.D.   On: 12/27/2023 02:03   DG Chest Port 1 View Result Date: 12/27/2023 CLINICAL DATA:  Projectile vomiting and diarrhea with dehydration and hypotension. EXAM: PORTABLE CHEST 1 VIEW COMPARISON:  October 19, 2014 FINDINGS: The heart size and mediastinal contours are within normal limits. Both lungs are clear. The visualized skeletal structures are unremarkable. IMPRESSION: No active disease. Electronically Signed   By: Suzen Dials M.D.   On: 12/27/2023 01:20    EKG: I independently viewed the EKG done and my findings are as followed: EKG was not done in the ED  Assessment/Plan Present on Admission:  Anaphylactic reaction  Principal Problem:   Anaphylactic reaction Active Problems:   Nausea & vomiting   Diarrhea   Hypotension   GERD (gastroesophageal reflux disease)   Hyperglycemia   Hypoalbuminemia due to protein-calorie malnutrition (HCC)   Syncope and collapse   Anaphylactic reaction EpiPen  x 2, Benadryl , Pepcid , IV Solu-Medrol  25 mg x 1 was given IV hydration was provided Patient will be admitted to ICU unit for closer monitoring EpiPen  at bedside diphenhydramine  25 mg every 6 hours as needed Consider  further Pepcid  and steroid as needed for persistent adjunctive treatment for anaphylaxis  Nausea, vomiting, diarrhea possibly due to viral gastroenteritis Continue Zofran  as needed Continue clear liquid diet with plan to advance diet as tolerated Continue IV hydration C. difficile and GI stool panel pending  Syncope and collapse This may be due to hypovolemic shock Continue telemetry and watch for arrhythmias Troponins x 1 was < 2 EKG showed normal sinus rhythm at a rate of 81 bpm Echocardiogram will be done to rule out significant aortic stenosis or other outflow obstruction, and also to  evaluate EF and to rule out segmental/Regional wall motion abnormalities.  Carotid artery Dopplers will be done to rule out hemodynamically significant stenosis  Hypotension Patient has not responded to IV fluid Peripheral IV Levophed  will be started Continue IV hydration  Hyperglycemia with no known history of type 2 diabetes mellitus This may be reactive, hemoglobin A1c will be checked  Hypoalbuminemia possibly secondary to moderate protein calorie malnutrition Albumin 2.8, protein supplement will be provided  Leukocytosis possibly reactive versus infectious WBC 23.4 CT abdomen and pelvis only showed possible early SBO/ileus, however, patient has diarrhea C. difficile and GI stool panel pending  GERD Continue Protonix    DVT prophylaxis: Lovenox   Code Status: Full code  Family Communication: None at bedside  Consults: None   Severity of Illness: The appropriate patient status for this patient is INPATIENT. Inpatient status is judged to be reasonable and necessary in order to provide the required intensity of service to ensure the patient's safety. The patient's presenting symptoms, physical exam findings, and initial radiographic and laboratory data in the context of their chronic comorbidities is felt to place them at high risk for further clinical deterioration. Furthermore, it is  not anticipated that the patient will be medically stable for discharge from the hospital within 2 midnights of admission.   * I certify that at the point of admission it is my clinical judgment that the patient will require inpatient hospital care spanning beyond 2 midnights from the point of admission due to high intensity of service, high risk for further deterioration and high frequency of surveillance required.*  Author: Gianno Volner, DO 12/27/2023 5:21 AM  For on call review [email or web address].

## 2023-12-27 NOTE — Progress Notes (Signed)
 [name] is a 33 y.o. female with medical history significant of GERD who presents to the emergency department from home via EMS due to complaint of allergic reaction.  Patient states that he called family abdomen experiencing GI illness, she complained of 2-day onset of nausea, diarrhea and poor oral intake due to poor intolerance.  She felt better today, so she tried to eat some pistachio nuts, shortly after this, she developed nausea, vomiting, rash, malaise and fatigue.  EMS was activated and on arrival of EMS team, BP was noted to be hypotensive at 63/44, she complained of abdominal cramping and vomiting yesterday.  Patient was taken to the ED for further evaluation and management.   She was admitted with an anaphylactic reaction and appears to have progressed into shock requiring norepinephrine  for blood pressure management.  She was also noted to have a viral gastroenteritis.  She is now able to tolerate a full diet and stool studies are still pending.  She continues to have need for norepinephrine  and will be started on midodrine  to assist with blood pressure management and will receive further fluid bolus given the likeliness for dehydration.  Patient seen and evaluated at bedside and has been admitted after midnight.  Total critical care time: 35 minutes.

## 2023-12-27 NOTE — TOC CM/SW Note (Signed)
 Transition of Care [facility]) - Inpatient Brief Assessment   Patient Details  Name: NIALA STCHARLES MRN: [id number] Date of Birth: 01-Jun-1991  Transition [facility]) CM/SW Contact:    Noreen KATHEE Pinal, LCSWA Phone Number: 12/27/2023, 1:24 PM   Clinical Narrative: PCP list added to AVS. Patient shared that she will follow up with Medicaid since she was not able to re-certify due to being incarcerated.    Transition of Care Department [facility]) has reviewed patient and no TOC needs have been identified at this time. We will continue to monitor patient advancement through interdisciplinary progression rounds. If new patient transition needs arise, please place a TOC consult.  Transition of Care Asessment: Insurance and Status: Selfpay (Patient expressed that she was unable re-certify for Medicaid due to be incarcerated, but will follow up after DC.) Patient has primary care physician: No (PCP list added to AVS) Home environment has been reviewed: Single Family Home- lives with her mom Prior level of function:: Independent Prior/Current Home Services: No current home services Social Drivers of Health Review: SDOH reviewed no interventions necessary Readmission risk has been reviewed: Yes Transition of care needs: no transition of care needs at this time

## 2023-12-27 NOTE — ED Provider Notes (Signed)
 [facility]-EMERGENCY DEPT [facility] Emergency Department Provider Note MRN:  [id number]  Arrival date & time: 12/27/23     Chief Complaint   Allergic Reaction   History of Present Illness   [name] is a 32 y.o. year-old female with no pertinent past medical history presenting to the ED with chief complaint of allergic reaction.  Further history obtained from patient's stepfather.  Whole family has been experiencing GI illness, patient became sick with diarrhea about 2 days ago.  Nausea and p.o. intolerance as well.  Felt well enough today to try to eat something.  Ate some pistachio nuts.  Soon after began experiencing severe rash, nausea vomiting, malaise and fatigue, EMS was called.  No prior history of nut allergies or any allergies.  Review of Systems  A thorough review of systems was obtained and all systems are negative except as noted in the HPI and PMH.   Patient's Health History    Past Medical History:  Diagnosis Date   Bacterial infection    Family history of varicose veins    FHx: hypertension    FHx: scoliosis    FHx: thyroid disease    H/O varicella    Heart murmur    Hepatitis    hepatitis C- 2015, confirmed this year   Hepatitis C    History of drug use    Hx: UTI (urinary tract infection) 2012   No pertinent past medical history    Postpartum eclampsia 08/2006    Past Surgical History:  Procedure Laterality Date   NO PAST SURGERIES      Family History  Problem Relation Age of Onset   Anemia Mother    Depression Mother    Hypertension Father    Heart disease Maternal Grandmother    Depression Maternal Grandmother    Heart disease Paternal Grandfather    Anesthesia problems Neg Hx     Social History   Socioeconomic History   Marital status: Single    Spouse name: Not on file   Number of children: Not on file   Years of education: Not on file   Highest education level: Not on file  Occupational History   Not on file  Tobacco Use    Smoking status: Former    Current packs/day: 0.00    Types: Cigarettes    Quit date: 08/23/2016    Years since quitting: 7.3   Smokeless tobacco: Never   Tobacco comments:    up to half pack/day  Vaping Use   Vaping status: Some Days  Substance and Sexual Activity   Alcohol use: Yes    Comment: occ   Drug use: Yes    Types: Marijuana, Methamphetamines    Comment: last use mari today and meth one month ago   Sexual activity: Not Currently    Birth control/protection: None  Other Topics Concern   Not on file  Social History Narrative   Not on file   Social Drivers of Health   Financial Resource Strain: Not on file  Food Insecurity: Not on file  Transportation Needs: Not on file  Physical Activity: Not on file  Stress: Not on file  Social Connections: Not on file  Intimate Partner Violence: Not on file     Physical Exam   Vitals:   12/27/23 0050 12/27/23 0115  BP: 109/80 109/73  Pulse: (!) 102 (!) 103  Resp: 17 13  Temp:    SpO2: 100% 100%    CONSTITUTIONAL: Ill-appearing, NAD NEURO/PSYCH:  Alert and oriented x 3, no focal deficits EYES:  eyes equal and reactive ENT/NECK:  no LAD, no JVD CARDIO: Regular rate, well-perfused, normal S1 and S2 PULM:  CTAB no wheezing or rhonchi GI/GU:  non-distended, non-tender MSK/SPINE:  No gross deformities, no edema SKIN: Scattered macular hive-like rash   *Additional and/or pertinent findings included in MDM below  Diagnostic and Interventional Summary    EKG Interpretation Date/Time:    Ventricular Rate:    PR Interval:    QRS Duration:    QT Interval:    QTC Calculation:   R Axis:      Text Interpretation:         Labs Reviewed  CBC - Abnormal; Notable for the following components:      Result Value   WBC 23.4 (*)    All other components within normal limits  COMPREHENSIVE METABOLIC PANEL - Abnormal; Notable for the following components:   CO2 20 (*)    Glucose, Bld 176 (*)    BUN 22 (*)    Creatinine,  Ser 1.10 (*)    Calcium  8.0 (*)    Total Protein 5.7 (*)    Albumin 2.8 (*)    AST 43 (*)    ALT 72 (*)    All other components within normal limits  CULTURE, BLOOD (ROUTINE X 2)  CULTURE, BLOOD (ROUTINE X 2)  C DIFFICILE QUICK SCREEN W PCR REFLEX    GASTROINTESTINAL PANEL BY PCR, STOOL (REPLACES STOOL CULTURE)  LIPASE, BLOOD  ETHANOL  HCG, SERUM, QUALITATIVE  URINALYSIS, ROUTINE W REFLEX MICROSCOPIC  LACTIC ACID, PLASMA  LACTIC ACID, PLASMA    CT ABDOMEN PELVIS W CONTRAST  Final Result    DG Chest Port 1 View  Final Result      Medications  cefTRIAXone  (ROCEPHIN ) 2 g in sodium chloride  0.9 % 100 mL IVPB (has no administration in time range)  metroNIDAZOLE  (FLAGYL ) IVPB 500 mg (has no administration in time range)  EPINEPHrine  (EPI-PEN) injection 0.3 mg (0.3 mg Intramuscular Given 12/27/23 0005)  sodium chloride  0.9 % bolus 1,000 mL (0 mLs Intravenous Stopped 12/27/23 0312)  sodium chloride  0.9 % bolus 1,000 mL (0 mLs Intravenous Stopped 12/27/23 0313)  iohexol  (OMNIPAQUE ) 300 MG/ML solution 100 mL (100 mLs Intravenous Contrast Given 12/27/23 0132)  ondansetron  (ZOFRAN ) injection 4 mg (4 mg Intravenous Given 12/27/23 0057)  famotidine  (PEPCID ) IVPB 20 mg premix (0 mg Intravenous Stopped 12/27/23 0313)  methylPREDNISolone  sodium succinate (SOLU-MEDROL ) 125 mg/2 mL injection 125 mg (125 mg Intravenous Not Given 12/27/23 0114)  diphenhydrAMINE  (BENADRYL ) injection 50 mg (50 mg Intravenous Given 12/27/23 0105)     Procedures  /  Critical Care .Critical Care  Performed by: Theadore Ozell HERO, MD Authorized by: Theadore Ozell HERO, MD   Critical care provider statement:    Critical care time (minutes):  85   Critical care was necessary to treat or prevent imminent or life-threatening deterioration of the following conditions: Anaphylaxis.   Critical care was time spent personally by me on the following activities:  Development of treatment plan with patient or surrogate, discussions with  consultants, evaluation of patient's response to treatment, examination of patient, ordering and review of laboratory studies, ordering and review of radiographic studies, ordering and performing treatments and interventions, pulse oximetry, re-evaluation of patient's condition and review of old charts   ED Course and Medical Decision Making  Initial Impression and Ddx Suspect viral GI illness, also concerning for anaphylactic shock.  Patient was hypotensive with EMS,  received EpiPen .  On my assessment patient continues to be hypotensive, feels unwell, looks pale and ill.  Having nausea vomiting diarrhea, having rash, seems to be fitting the criteria for anaphylaxis with shock.  Provided additional EpiPen , 2 L of fluid.  Could be the hypotension is more related to hypovolemia in the setting of recent GI losses.  Past medical/surgical history that increases complexity of ED encounter: None  Interpretation of Diagnostics I personally reviewed the chest x-ray and my interpretation is as follows: No lobar opacity or pneumothorax  Labs reveal marked leukocytosis, otherwise no significant blood count or electrolyte disturbance.  Mild acidosis, mild elevation of LFTs.  Patient Reassessment and Ultimate Disposition/Management     CT scan of the abdomen is largely unremarkable.  Patient's blood pressure has normalized with the 2 L of crystalloid as well as the epi drip and has maintained its normal value for a couple hours.  Patient was given empiric coverage of intra-abdominal sepsis given the presentation with leukocytosis, tachycardia, hypotension, code sepsis was called.  However the preferred diagnosis is anaphylaxis coincidentally due to pistachios in the setting of recent GI illness.  C. difficile and GI pathogen testing sent, accepted for admission by hospitalist service.  Patient management required discussion with the following services or consulting groups:  None  Complexity of Problems  Addressed Acute illness or injury that poses threat of life of bodily function  Additional Data Reviewed and Analyzed Further history obtained from: Further history from spouse/family member  Additional Factors Impacting ED Encounter Risk Consideration of hospitalization  Ozell HERO. Theadore, MD [facility] Emergency Medicine [facility] [email or web address]  Final Clinical Impressions(s) / ED Diagnoses     ICD-10-CM   1. Anaphylaxis, initial encounter  T78.2XXA       ED Discharge Orders     None        Discharge Instructions Discussed with and Provided to Patient:   Discharge Instructions   None      Theadore Ozell HERO, MD 12/27/23 [phone]

## 2023-12-27 NOTE — Sepsis Progress Note (Signed)
 Elink monitoring for the code sepsis protocol.

## 2023-12-27 NOTE — Progress Notes (Signed)
*  PRELIMINARY RESULTS* Echocardiogram 2D Echocardiogram has been performed.  [name] 12/27/2023, 3:34 PM

## 2023-12-27 NOTE — Discharge Instructions (Signed)

## 2023-12-28 LAB — CBC
HCT: 35.4 % — ABNORMAL LOW (ref 36.0–46.0)
Hemoglobin: 11.8 g/dL — ABNORMAL LOW (ref 12.0–15.0)
MCH: 30.9 pg (ref 26.0–34.0)
MCHC: 33.3 g/dL (ref 30.0–36.0)
MCV: 92.7 fL (ref 80.0–100.0)
Platelets: 203 10*3/uL (ref 150–400)
RBC: 3.82 MIL/uL — ABNORMAL LOW (ref 3.87–5.11)
RDW: 12.1 % (ref 11.5–15.5)
WBC: 15.8 10*3/uL — ABNORMAL HIGH (ref 4.0–10.5)
nRBC: 0 % (ref 0.0–0.2)

## 2023-12-28 LAB — COMPREHENSIVE METABOLIC PANEL
ALT: 56 U/L — ABNORMAL HIGH (ref 0–44)
AST: 30 U/L (ref 15–41)
Albumin: 2.7 g/dL — ABNORMAL LOW (ref 3.5–5.0)
Alkaline Phosphatase: 75 U/L (ref 38–126)
Anion gap: 9 (ref 5–15)
BUN: 11 mg/dL (ref 6–20)
CO2: 23 mmol/L (ref 22–32)
Calcium: 7.9 mg/dL — ABNORMAL LOW (ref 8.9–10.3)
Chloride: 107 mmol/L (ref 98–111)
Creatinine, Ser: 0.67 mg/dL (ref 0.44–1.00)
GFR, Estimated: >60 mL/min (ref >60)
Glucose, Bld: 127 mg/dL — ABNORMAL HIGH (ref 70–99)
Potassium: 3.9 mmol/L (ref 3.5–5.1)
Sodium: 139 mmol/L (ref 135–145)
Total Bilirubin: 0.2 mg/dL (ref 0.0–1.2)
Total Protein: 5.3 g/dL — ABNORMAL LOW (ref 6.5–8.1)

## 2023-12-28 LAB — MAGNESIUM: Magnesium: 2 mg/dL (ref 1.7–2.4)

## 2023-12-28 LAB — CORTISOL: Cortisol, Plasma: 9.9 ug/dL

## 2023-12-28 NOTE — Plan of Care (Signed)
?  Problem: Education: ?Goal: Knowledge of General Education information will improve ?Description: Including pain rating scale, medication(s)/side effects and non-pharmacologic comfort measures ?Outcome: Progressing ?  ?Problem: Nutrition: ?Goal: Adequate nutrition will be maintained ?Outcome: Progressing ?  ?Problem: Coping: ?Goal: Level of anxiety will decrease ?Outcome: Progressing ?  ?Problem: Elimination: ?Goal: Will not experience complications related to bowel motility ?Outcome: Progressing ?Goal: Will not experience complications related to urinary retention ?Outcome: Progressing ?  ?

## 2023-12-28 NOTE — Plan of Care (Signed)

## 2023-12-28 NOTE — Progress Notes (Signed)
 PROGRESS NOTE    [name]  FMW:[id number] DOB: 1991/08/15 DOA: 12/26/2023 PCP: Patient, No Pcp Per   Brief Narrative:    [name] is a 33 y.o. female with medical history significant of GERD who presents to the emergency department from home via EMS due to complaint of allergic reaction.  Patient states that he called family abdomen experiencing GI illness, she complained of 2-day onset of nausea, diarrhea and poor oral intake due to poor intolerance.  She felt better today, so she tried to eat some pistachio nuts, shortly after this, she developed nausea, vomiting, rash, malaise and fatigue.  EMS was activated and on arrival of EMS team, BP was noted to be hypotensive at 63/44, she complained of abdominal cramping and vomiting yesterday.  Patient was taken to the ED for further evaluation and management.    She was admitted with an anaphylactic reaction and appears to have progressed into shock requiring norepinephrine  for blood pressure management.  She was also noted to have a viral gastroenteritis.    Assessment & Plan:   Principal Problem:   Anaphylactic reaction Active Problems:   Nausea & vomiting   Diarrhea   Hypotension   GERD (gastroesophageal reflux disease)   Hyperglycemia   Hypoalbuminemia due to protein-calorie malnutrition (HCC)   Syncope and collapse  Assessment and Plan:   Anaphylactic shock EpiPen  x 2, Benadryl , Pepcid , IV Solu-Medrol  25 mg x 1 was given IV hydration was provided Patient will be admitted to ICU unit for closer monitoring EpiPen  at bedside diphenhydramine  25 mg every 6 hours as needed Continue norepinephrine  infusion and wean as tolerated with midodrine  ordered   Nausea, vomiting, diarrhea possibly due to viral gastroenteritis Continue Zofran  as needed Continue clear liquid diet with plan to advance diet as tolerated Continue IV hydration C. difficile and GI stool panel pending   Syncope and collapse This may be due to  hypovolemic/anaphylactic shock Continue telemetry and watch for arrhythmias Troponins x 1 was < 2 EKG showed normal sinus rhythm at a rate of 81 bpm Echocardiogram with preserved LVEF    Hyperglycemia with no known history of type 2 diabetes mellitus This may be reactive, hemoglobin A1c will be checked   Hypoalbuminemia possibly secondary to moderate protein calorie malnutrition Albumin 2.8, protein supplement will be provided   Leukocytosis possibly reactive versus infectious-improving Follow daily CBC CT abdomen and pelvis only showed possible early SBO/ileus, however, patient has diarrhea C. difficile and GI stool panel pending   GERD Continue Protonix    DVT prophylaxis: SCDs Code Status: Full Family Communication: None at bedside Disposition Plan:  Status is: Inpatient Remains inpatient appropriate because: Need for ongoing ICU level of care with norepinephrine  infusion.  Consultants:  None  Procedures:  None  Antimicrobials:  None   Subjective: Patient seen and evaluated today with no new acute complaints or concerns. No acute concerns or events noted overnight.  She is tolerating diet with no further nausea or vomiting, but continues to remain on norepinephrine .  Objective: Vitals:   12/28/23 1000 12/28/23 1015 12/28/23 1045 12/28/23 1100  BP: 99/64 (!) 88/53 94/62 90/68   Pulse: 70 77 74 92  Resp: 16 16 20  (!) 21  Temp:      TempSrc:      SpO2: 100% 99% 99% 100%  Weight:      Height:        Intake/Output Summary (Last 24 hours) at 12/28/2023 1113 Last data filed at 12/28/2023 1052 Gross per 24 hour  Intake 441.67 ml  Output 400 ml  Net 41.67 ml   Filed Weights   12/26/23 2347 12/27/23 1101  Weight: 59 kg 60.4 kg    Examination:  General exam: Appears calm and comfortable  Respiratory system: Clear to auscultation. Respiratory effort normal. Cardiovascular system: S1 & S2 heard, RRR.  Gastrointestinal system: Abdomen is soft Central nervous  system: Alert and awake Extremities: No edema Skin: No significant lesions noted Psychiatry: Flat affect.    Data Reviewed: I have personally reviewed following labs and imaging studies  CBC: Recent Labs  Lab 12/27/23 0016 12/27/23 0530 12/28/23 0344  WBC 23.4* 12.3* 15.8*  HGB 13.9 13.4 11.8*  HCT 42.7 41.1 35.4*  MCV 93.0 93.4 92.7  PLT 244 199 203   Basic Metabolic Panel: Recent Labs  Lab 12/27/23 0016 12/27/23 0305 12/27/23 1211 12/28/23 0344  NA 139  --  139 139  K 3.5  --  3.5 3.9  CL 109  --  104 107  CO2 20*  --  23 23  GLUCOSE 176*  --  146* 127*  BUN 22*  --  12 11  CREATININE 1.10*  --  0.74 0.67  CALCIUM  8.0*  --  8.1* 7.9*  MG  --  1.8  --  2.0  PHOS  --  3.4  --   --    GFR: Estimated Creatinine Clearance: 90 mL/min (by C-G formula based on SCr of 0.67 mg/dL). Liver Function Tests: Recent Labs  Lab 12/27/23 0016 12/27/23 1211 12/28/23 0344  AST 43* 45* 30  ALT 72* 70* 56*  ALKPHOS 71 67 75  BILITOT 0.6 0.5 0.2  PROT 5.7* 6.1* 5.3*  ALBUMIN 2.8* 3.0* 2.7*   Recent Labs  Lab 12/27/23 0016  LIPASE 24   No results for input(s): AMMONIA in the last 168 hours. Coagulation Profile: No results for input(s): INR, PROTIME in the last 168 hours. Cardiac Enzymes: No results for input(s): CKTOTAL, CKMB, CKMBINDEX, TROPONINI in the last 168 hours. BNP (last 3 results) No results for input(s): PROBNP in the last 8760 hours. HbA1C: Recent Labs    12/27/23 0016  HGBA1C 5.1   CBG: No results for input(s): GLUCAP in the last 168 hours. Lipid Profile: No results for input(s): CHOL, HDL, LDLCALC, TRIG, CHOLHDL, LDLDIRECT in the last 72 hours. Thyroid Function Tests: No results for input(s): TSH, T4TOTAL, FREET4, T3FREE, THYROIDAB in the last 72 hours. Anemia Panel: No results for input(s): VITAMINB12, FOLATE, FERRITIN, TIBC, IRON, RETICCTPCT in the last 72 hours. Sepsis Labs: Recent Labs   Lab 12/27/23 0305 12/27/23 0514  LATICACIDVEN 0.9 1.4    Recent Results (from the past 240 hours)  Culture, blood (Routine X 2) w Reflex to ID Panel     Status: None (Preliminary result)   Collection Time: 12/27/23  1:20 AM   Specimen: BLOOD  Result Value Ref Range Status   Specimen Description BLOOD BLOOD RIGHT HAND  Final   Special Requests   Final    BOTTLES DRAWN AEROBIC ONLY Blood Culture adequate volume   Culture   Final    NO GROWTH 1 DAY Performed at [facility], [address] [place], [place] [address]    Report Status PENDING  Incomplete  Culture, blood (Routine X 2) w Reflex to ID Panel     Status: None (Preliminary result)   Collection Time: 12/27/23  2:59 AM   Specimen: BLOOD  Result Value Ref Range Status   Specimen Description BLOOD BLOOD LEFT HAND  Final   Special Requests   Final    BOTTLES DRAWN AEROBIC AND ANAEROBIC Blood Culture adequate volume   Culture   Final    NO GROWTH 1 DAY Performed at [facility], [address], [place], [place] [address]    Report Status PENDING  Incomplete  MRSA Next Gen by PCR, Nasal     Status: Abnormal   Collection Time: 12/27/23 11:15 AM   Specimen: Nasal Mucosa; Nasal Swab  Result Value Ref Range Status   MRSA by PCR Next Gen DETECTED (A) NOT DETECTED Final    Comment: RESULT CALLED TO, READ BACK BY AND VERIFIED WITH: EDGAR @ 1413 ON [id number] BY HENDERSON L (NOTE) The GeneXpert MRSA Assay (FDA approved for NASAL specimens only), is one component of a comprehensive MRSA colonization surveillance program. It is not intended to diagnose MRSA infection nor to guide or monitor treatment for MRSA infections. Test performance is not FDA approved in patients less than 72 years old. Performed at [facility], [address] [place], [place] [address]          Radiology Studies: ECHOCARDIOGRAM COMPLETE Result Date: 12/27/2023    ECHOCARDIOGRAM REPORT   Patient Name:   [name] Date of Exam: 12/27/2023 Medical Rec  #:  [id number]      Height:       65.0 in Accession #:    [id number]     Weight:       133.2 lb Date of Birth:  [birth year]      BSA:          1.664 m Patient Age:    32 years       BP:           105/58 mmHg Patient Gender: F              HR:           59 bpm. Exam Location:  [facility] Procedure: 2D Echo, Cardiac Doppler and Color Doppler Indications:    Syncope R55  History:        Patient has prior history of Echocardiogram examinations, most                 recent 08/01/2014. Hx of Cardiac arrest (HCC), Hypotension,                 Syncope and collapse.  Sonographer:    Aida Pizza RCS Referring Phys: [id number] OLADAPO ADEFESO IMPRESSIONS  1. Left ventricular ejection fraction, by estimation, is 70 to 75%. The left ventricle has hyperdynamic function. The left ventricle has no regional wall motion abnormalities. Left ventricular diastolic parameters were normal.  2. Right ventricular systolic function is normal. The right ventricular size is normal. There is normal pulmonary artery systolic pressure. The estimated right ventricular systolic pressure is 28.1 mmHg.  3. Left atrial size was mildly dilated.  4. The mitral valve is grossly normal. Mild mitral valve regurgitation.  5. Tricuspid valve regurgitation is moderate.  6. The aortic valve is tricuspid. Aortic valve regurgitation is not visualized. No aortic stenosis is present.  7. The inferior vena cava is dilated in size with >50% respiratory variability, suggesting right atrial pressure of 8 mmHg. Comparison(s): Prior images unable to be directly viewed. FINDINGS  Left Ventricle: Left ventricular ejection fraction, by estimation, is 70 to 75%. The left ventricle has hyperdynamic function. The left ventricle has no regional wall motion abnormalities. The left ventricular internal cavity size was normal in size. There  is no left ventricular hypertrophy. Left ventricular diastolic parameters were normal. Right Ventricle: The right ventricular size is normal. No  increase in right ventricular wall thickness. Right ventricular systolic function is normal. There is normal pulmonary artery systolic pressure. The tricuspid regurgitant velocity is 2.24 m/s, and  with an assumed right atrial pressure of 8 mmHg, the estimated right ventricular systolic pressure is 28.1 mmHg. Left Atrium: Left atrial size was mildly dilated. Right Atrium: Right atrial size was normal in size. Pericardium: There is no evidence of pericardial effusion. Mitral Valve: The mitral valve is grossly normal. Mild mitral valve regurgitation. Tricuspid Valve: The tricuspid valve is grossly normal. Tricuspid valve regurgitation is moderate. Aortic Valve: The aortic valve is tricuspid. Aortic valve regurgitation is not visualized. No aortic stenosis is present. Pulmonic Valve: The pulmonic valve was grossly normal. Pulmonic valve regurgitation is trivial. Aorta: The aortic root is normal in size and structure. Venous: The inferior vena cava is dilated in size with greater than 50% respiratory variability, suggesting right atrial pressure of 8 mmHg. IAS/Shunts: No atrial level shunt detected by color flow Doppler.  LEFT VENTRICLE PLAX 2D LVIDd:         4.30 cm   Diastology LVIDs:         2.00 cm   LV e' medial:    13.30 cm/s LV PW:         0.70 cm   LV E/e' medial:  7.6 LV IVS:        0.80 cm   LV e' lateral:   16.20 cm/s LVOT diam:     2.10 cm   LV E/e' lateral: 6.2 LV SV:         100 LV SV Index:   60 LVOT Area:     3.46 cm  RIGHT VENTRICLE RV S prime:     13.30 cm/s TAPSE (M-mode): 2.0 cm LEFT ATRIUM             Index        RIGHT ATRIUM           Index LA diam:        3.30 cm 1.98 cm/m   RA Area:     15.20 cm LA Vol (A2C):   79.9 ml 48.01 ml/m  RA Volume:   40.70 ml  24.46 ml/m LA Vol (A4C):   43.6 ml 26.20 ml/m LA Biplane Vol: 60.7 ml 36.48 ml/m  AORTIC VALVE LVOT Vmax:   104.00 cm/s LVOT Vmean:  66.800 cm/s LVOT VTI:    0.288 m  AORTA Ao Root diam: 3.50 cm MITRAL VALVE                TRICUSPID VALVE  MV Area (PHT): 4.31 cm     TR Peak grad:   20.1 mmHg MV Decel Time: 176 msec     TR Vmax:        224.00 cm/s MV E velocity: 101.00 cm/s MV A velocity: 56.10 cm/s   SHUNTS MV E/A ratio:  1.80         Systemic VTI:  0.29 m                             Systemic Diam: 2.10 cm Jayson Sierras MD Electronically signed by Jayson Sierras MD Signature Date/Time: 12/27/2023/4:52:11 PM    Final    CT ABDOMEN PELVIS W CONTRAST Result Date: 12/27/2023 CLINICAL DATA:  Cramping with syncopal episode, vomiting  and diarrhea. EXAM: CT ABDOMEN AND PELVIS WITH CONTRAST TECHNIQUE: Multidetector CT imaging of the abdomen and pelvis was performed using the standard protocol following bolus administration of intravenous contrast. RADIATION DOSE REDUCTION: This exam was performed according to the departmental dose-optimization program which includes automated exposure control, adjustment of the mA and/or kV according to patient size and/or use of iterative reconstruction technique. CONTRAST:  OMNIPAQUE  IOHEXOL  300 MG/ML  SOLN COMPARISON:  None Available. FINDINGS: Lower chest: No acute abnormality. Hepatobiliary: No focal liver abnormality is seen. No gallstones, gallbladder wall thickening, or biliary dilatation. Pancreas: Unremarkable. No pancreatic ductal dilatation or surrounding inflammatory changes. Spleen: Normal in size without focal abnormality. Adrenals/Urinary Tract: Adrenal glands are unremarkable. Kidneys are normal, without renal calculi, focal lesion, or hydronephrosis. Bladder is unremarkable. Stomach/Bowel: There is a small to moderate sized hiatal hernia. Appendix appears normal. Several air-filled loops of small bowel within the mid and lower abdomen appear to approach the upper limit of normal caliber. A clear transition zone is not identified. Vascular/Lymphatic: No significant vascular findings are present. No enlarged abdominal or pelvic lymph nodes. Reproductive: Uterus and bilateral adnexa are unremarkable.  Other: There is a 2.0 cm x 1.5 cm x 2.6 cm fat containing right-sided para umbilical hernia. A small amount of posterior pelvic free fluid is seen, likely physiologic. Musculoskeletal: No acute or significant osseous findings. IMPRESSION: 1. Small bowel within the mid and lower abdomen which appear to approach the upper limit of normal caliber. While this may be transient in nature, sequelae associated with an early small-bowel obstruction or early ileus cannot completely be excluded. 2. Small to moderate sized hiatal hernia. Electronically Signed   By: Suzen Dials M.D.   On: 12/27/2023 02:03   DG Chest Port 1 View Result Date: 12/27/2023 CLINICAL DATA:  Projectile vomiting and diarrhea with dehydration and hypotension. EXAM: PORTABLE CHEST 1 VIEW COMPARISON:  October 19, 2014 FINDINGS: The heart size and mediastinal contours are within normal limits. Both lungs are clear. The visualized skeletal structures are unremarkable. IMPRESSION: No active disease. Electronically Signed   By: Suzen Dials M.D.   On: 12/27/2023 01:20        Scheduled Meds:  Chlorhexidine  Gluconate Cloth  6 each Topical Daily   midodrine   5 mg Oral TID WC   pantoprazole   40 mg Oral Daily   Continuous Infusions:  norepinephrine  (LEVOPHED ) Adult infusion 3 mcg/min (12/28/23 1052)     LOS: 1 day    Critical care time: 35 minutes    Cynthia Hurlbutt JONETTA Fairly, [name] [facility]  If 7PM-7AM, please contact night-coverage [email or web address] 12/28/2023, 11:13 AM

## 2023-12-29 LAB — COMPREHENSIVE METABOLIC PANEL
ALT: 59 U/L — ABNORMAL HIGH (ref 0–44)
AST: 31 U/L (ref 15–41)
Albumin: 2.8 g/dL — ABNORMAL LOW (ref 3.5–5.0)
Alkaline Phosphatase: 69 U/L (ref 38–126)
Anion gap: 6 (ref 5–15)
BUN: 11 mg/dL (ref 6–20)
CO2: 26 mmol/L (ref 22–32)
Calcium: 8.3 mg/dL — ABNORMAL LOW (ref 8.9–10.3)
Chloride: 104 mmol/L (ref 98–111)
Creatinine, Ser: 0.62 mg/dL (ref 0.44–1.00)
GFR, Estimated: >60 mL/min (ref >60)
Glucose, Bld: 88 mg/dL (ref 70–99)
Potassium: 4.1 mmol/L (ref 3.5–5.1)
Sodium: 136 mmol/L (ref 135–145)
Total Bilirubin: 0.6 mg/dL (ref 0.0–1.2)
Total Protein: 5.6 g/dL — ABNORMAL LOW (ref 6.5–8.1)

## 2023-12-29 LAB — CBC
HCT: 42.1 % (ref 36.0–46.0)
Hemoglobin: 13.5 g/dL (ref 12.0–15.0)
MCH: 29.5 pg (ref 26.0–34.0)
MCHC: 32.1 g/dL (ref 30.0–36.0)
MCV: 91.9 fL (ref 80.0–100.0)
Platelets: 187 10*3/uL (ref 150–400)
RBC: 4.58 MIL/uL (ref 3.87–5.11)
RDW: 12 % (ref 11.5–15.5)
WBC: 8.3 10*3/uL (ref 4.0–10.5)
nRBC: 0 % (ref 0.0–0.2)

## 2023-12-29 LAB — MAGNESIUM: Magnesium: 1.9 mg/dL (ref 1.7–2.4)

## 2023-12-29 MED ORDER — EPINEPHRINE 0.3 MG/0.3ML IJ SOAJ: 0.3000 mg | Freq: Once | INTRAMUSCULAR | 1 refills | Status: AC | PRN

## 2023-12-29 MED ORDER — MUPIROCIN 2 % EX OINT: 1.0000 | TOPICAL_OINTMENT | Freq: Two times a day (BID) | CUTANEOUS | Status: DC

## 2023-12-29 MED ORDER — MIDODRINE HCL 5 MG PO TABS: 5.0000 mg | ORAL_TABLET | Freq: Three times a day (TID) | ORAL | 1 refills | Status: AC

## 2023-12-29 NOTE — Discharge Summary (Signed)
 Physician Discharge Summary  BINDU DOCTER FMW:[id number] DOB: 12-24-1990 DOA: 12/26/2023  PCP: Patient, No Pcp Per  Admit date: 12/26/2023  Discharge date: 12/29/2023  Admitted From:Home  Disposition:  Home  Recommendations for Outpatient Follow-up:  Follow up with PCP in 1-2 weeks Continue on midodrine  5 mg 3 times daily for blood pressure support and monitor blood pressures at home and at follow-up EpiPen  prescribed as needed until to avoid pistachios  Home Health: None  Equipment/Devices: None  Discharge Condition:Stable  CODE STATUS: Full  Diet recommendation: Heart Healthy  Brief/Interim Summary: [name] is a 33 y.o. female with medical history significant of GERD who presents to the emergency department from home via EMS due to complaint of allergic reaction.  Patient states that he called family abdomen experiencing GI illness, she complained of 2-day onset of nausea, diarrhea and poor oral intake due to poor intolerance.  She felt better today, so she tried to eat some pistachio nuts, shortly after this, she developed nausea, vomiting, rash, malaise and fatigue.  EMS was activated and on arrival of EMS team, BP was noted to be hypotensive at 63/44, she complained of abdominal cramping and vomiting yesterday.  Patient was taken to the ED for further evaluation and management.    She was admitted with an anaphylactic reaction and appears to have progressed into shock requiring norepinephrine  for blood pressure management.  She was also noted to have a viral gastroenteritis.  She was being managed for anaphylactic shock which has now resolved.  She is no longer symptomatic and tolerating diet as well.  She is otherwise in stable condition for discharge today with no other acute events or concerns noted.  Discharge Diagnoses:  Principal Problem:   Anaphylactic reaction Active Problems:   Nausea & vomiting   Diarrhea   Hypotension   GERD (gastroesophageal reflux disease)    Hyperglycemia   Hypoalbuminemia due to protein-calorie malnutrition (HCC)   Syncope and collapse  Principal discharge diagnosis: Anaphylactic shock along with viral gastroenteritis.  Discharge Instructions  Discharge Instructions     Diet - low sodium heart healthy   Complete by: As directed    Increase activity slowly   Complete by: As directed       Allergies as of 12/29/2023   No Known Allergies      Medication List     TAKE these medications    acetaminophen  325 MG tablet Commonly known as: Tylenol  Take 2 tablets (650 mg total) by mouth every 6 (six) hours as needed (for pain scale < 4).   albuterol  108 (90 Base) MCG/ACT inhaler Commonly known as: VENTOLIN  HFA Inhale 2 puffs into the lungs every 6 (six) hours as needed for wheezing or shortness of breath.   EPINEPHrine  0.3 mg/0.3 mL Soaj injection Commonly known as: EPI-PEN Inject 0.3 mg into the muscle once as needed (Anaphylaxis).   ferrous sulfate  325 (65 FE) MG tablet Take 1 tablet (325 mg total) by mouth every other day.   ibuprofen  600 MG tablet Commonly known as: ADVIL  Take 1 tablet (600 mg total) by mouth every 8 (eight) hours as needed.   midodrine  5 MG tablet Commonly known as: PROAMATINE  Take 1 tablet (5 mg total) by mouth 3 (three) times daily with meals.   pantoprazole  40 MG tablet Commonly known as: Protonix  Take 1 tablet (40 mg total) by mouth daily.   PNV PO Take by mouth.        No Known Allergies  Consultations: None  Procedures/Studies: ECHOCARDIOGRAM COMPLETE Result Date: 12/27/2023    ECHOCARDIOGRAM REPORT   Patient Name:   [name] Date of Exam: 12/27/2023 Medical Rec #:  [id number]      Height:       65.0 in Accession #:    [id number]     Weight:       133.2 lb Date of Birth:  1991-05-21      BSA:          1.664 m Patient Age:    32 years       BP:           105/58 mmHg Patient Gender: F              HR:           59 bpm. Exam Location:  [facility] Procedure: 2D Echo,  Cardiac Doppler and Color Doppler Indications:    Syncope R55  History:        Patient has prior history of Echocardiogram examinations, most                 recent 08/01/2014. Hx of Cardiac arrest (HCC), Hypotension,                 Syncope and collapse.  Sonographer:    Aida Pizza RCS Referring Phys: [id number] OLADAPO ADEFESO IMPRESSIONS  1. Left ventricular ejection fraction, by estimation, is 70 to 75%. The left ventricle has hyperdynamic function. The left ventricle has no regional wall motion abnormalities. Left ventricular diastolic parameters were normal.  2. Right ventricular systolic function is normal. The right ventricular size is normal. There is normal pulmonary artery systolic pressure. The estimated right ventricular systolic pressure is 28.1 mmHg.  3. Left atrial size was mildly dilated.  4. The mitral valve is grossly normal. Mild mitral valve regurgitation.  5. Tricuspid valve regurgitation is moderate.  6. The aortic valve is tricuspid. Aortic valve regurgitation is not visualized. No aortic stenosis is present.  7. The inferior vena cava is dilated in size with >50% respiratory variability, suggesting right atrial pressure of 8 mmHg. Comparison(s): Prior images unable to be directly viewed. FINDINGS  Left Ventricle: Left ventricular ejection fraction, by estimation, is 70 to 75%. The left ventricle has hyperdynamic function. The left ventricle has no regional wall motion abnormalities. The left ventricular internal cavity size was normal in size. There is no left ventricular hypertrophy. Left ventricular diastolic parameters were normal. Right Ventricle: The right ventricular size is normal. No increase in right ventricular wall thickness. Right ventricular systolic function is normal. There is normal pulmonary artery systolic pressure. The tricuspid regurgitant velocity is 2.24 m/s, and  with an assumed right atrial pressure of 8 mmHg, the estimated right ventricular systolic pressure is 28.1  mmHg. Left Atrium: Left atrial size was mildly dilated. Right Atrium: Right atrial size was normal in size. Pericardium: There is no evidence of pericardial effusion. Mitral Valve: The mitral valve is grossly normal. Mild mitral valve regurgitation. Tricuspid Valve: The tricuspid valve is grossly normal. Tricuspid valve regurgitation is moderate. Aortic Valve: The aortic valve is tricuspid. Aortic valve regurgitation is not visualized. No aortic stenosis is present. Pulmonic Valve: The pulmonic valve was grossly normal. Pulmonic valve regurgitation is trivial. Aorta: The aortic root is normal in size and structure. Venous: The inferior vena cava is dilated in size with greater than 50% respiratory variability, suggesting right atrial pressure of 8 mmHg. IAS/Shunts: No atrial level shunt detected by color flow Doppler.  LEFT VENTRICLE PLAX 2D LVIDd:         4.30 cm   Diastology LVIDs:         2.00 cm   LV e' medial:    13.30 cm/s LV PW:         0.70 cm   LV E/e' medial:  7.6 LV IVS:        0.80 cm   LV e' lateral:   16.20 cm/s LVOT diam:     2.10 cm   LV E/e' lateral: 6.2 LV SV:         100 LV SV Index:   60 LVOT Area:     3.46 cm  RIGHT VENTRICLE RV S prime:     13.30 cm/s TAPSE (M-mode): 2.0 cm LEFT ATRIUM             Index        RIGHT ATRIUM           Index LA diam:        3.30 cm 1.98 cm/m   RA Area:     15.20 cm LA Vol (A2C):   79.9 ml 48.01 ml/m  RA Volume:   40.70 ml  24.46 ml/m LA Vol (A4C):   43.6 ml 26.20 ml/m LA Biplane Vol: 60.7 ml 36.48 ml/m  AORTIC VALVE LVOT Vmax:   104.00 cm/s LVOT Vmean:  66.800 cm/s LVOT VTI:    0.288 m  AORTA Ao Root diam: 3.50 cm MITRAL VALVE                TRICUSPID VALVE MV Area (PHT): 4.31 cm     TR Peak grad:   20.1 mmHg MV Decel Time: 176 msec     TR Vmax:        224.00 cm/s MV E velocity: 101.00 cm/s MV A velocity: 56.10 cm/s   SHUNTS MV E/A ratio:  1.80         Systemic VTI:  0.29 m                             Systemic Diam: 2.10 cm Jayson Sierras MD  Electronically signed by Jayson Sierras MD Signature Date/Time: 12/27/2023/4:52:11 PM    Final    CT ABDOMEN PELVIS W CONTRAST Result Date: 12/27/2023 CLINICAL DATA:  Cramping with syncopal episode, vomiting and diarrhea. EXAM: CT ABDOMEN AND PELVIS WITH CONTRAST TECHNIQUE: Multidetector CT imaging of the abdomen and pelvis was performed using the standard protocol following bolus administration of intravenous contrast. RADIATION DOSE REDUCTION: This exam was performed according to the departmental dose-optimization program which includes automated exposure control, adjustment of the mA and/or kV according to patient size and/or use of iterative reconstruction technique. CONTRAST:  OMNIPAQUE  IOHEXOL  300 MG/ML  SOLN COMPARISON:  None Available. FINDINGS: Lower chest: No acute abnormality. Hepatobiliary: No focal liver abnormality is seen. No gallstones, gallbladder wall thickening, or biliary dilatation. Pancreas: Unremarkable. No pancreatic ductal dilatation or surrounding inflammatory changes. Spleen: Normal in size without focal abnormality. Adrenals/Urinary Tract: Adrenal glands are unremarkable. Kidneys are normal, without renal calculi, focal lesion, or hydronephrosis. Bladder is unremarkable. Stomach/Bowel: There is a small to moderate sized hiatal hernia. Appendix appears normal. Several air-filled loops of small bowel within the mid and lower abdomen appear to approach the upper limit of normal caliber. A clear transition zone is not identified. Vascular/Lymphatic: No significant vascular findings are present. No enlarged abdominal or pelvic lymph nodes. Reproductive: Uterus  and bilateral adnexa are unremarkable. Other: There is a 2.0 cm x 1.5 cm x 2.6 cm fat containing right-sided para umbilical hernia. A small amount of posterior pelvic free fluid is seen, likely physiologic. Musculoskeletal: No acute or significant osseous findings. IMPRESSION: 1. Small bowel within the mid and lower abdomen which  appear to approach the upper limit of normal caliber. While this may be transient in nature, sequelae associated with an early small-bowel obstruction or early ileus cannot completely be excluded. 2. Small to moderate sized hiatal hernia. Electronically Signed   By: Suzen Dials M.D.   On: 12/27/2023 02:03   DG Chest Port 1 View Result Date: 12/27/2023 CLINICAL DATA:  Projectile vomiting and diarrhea with dehydration and hypotension. EXAM: PORTABLE CHEST 1 VIEW COMPARISON:  October 19, 2014 FINDINGS: The heart size and mediastinal contours are within normal limits. Both lungs are clear. The visualized skeletal structures are unremarkable. IMPRESSION: No active disease. Electronically Signed   By: Suzen Dials M.D.   On: 12/27/2023 01:20     Discharge Exam: Vitals:   12/29/23 0700 12/29/23 0745  BP: 94/67   Pulse: 63   Resp: 14   Temp:  97.6 F (36.4 C)  SpO2: 97% 100%   Vitals:   12/29/23 0506 12/29/23 0600 12/29/23 0700 12/29/23 0745  BP: 98/72 99/70 94/67    Pulse: 62 67 63   Resp: 20 10 14    Temp:    97.6 F (36.4 C)  TempSrc:    Oral  SpO2: 100% 98% 97% 100%  Weight:      Height:        General: Pt is alert, awake, not in acute distress Cardiovascular: RRR, S1/S2 +, no rubs, no gallops Respiratory: CTA bilaterally, no wheezing, no rhonchi Abdominal: Soft, NT, ND, bowel sounds + Extremities: no edema, no cyanosis    The results of significant diagnostics from this hospitalization (including imaging, microbiology, ancillary and laboratory) are listed below for reference.     Microbiology: Recent Results (from the past 240 hours)  Culture, blood (Routine X 2) w Reflex to ID Panel     Status: None (Preliminary result)   Collection Time: 12/27/23  1:20 AM   Specimen: BLOOD  Result Value Ref Range Status   Specimen Description BLOOD BLOOD RIGHT HAND  Final   Special Requests   Final    BOTTLES DRAWN AEROBIC ONLY Blood Culture adequate volume   Culture   Final     NO GROWTH 2 DAYS Performed at [facility], [address] [place], [place] [address]    Report Status PENDING  Incomplete  Culture, blood (Routine X 2) w Reflex to ID Panel     Status: None (Preliminary result)   Collection Time: 12/27/23  2:59 AM   Specimen: BLOOD  Result Value Ref Range Status   Specimen Description BLOOD BLOOD LEFT HAND  Final   Special Requests   Final    BOTTLES DRAWN AEROBIC AND ANAEROBIC Blood Culture adequate volume   Culture   Final    NO GROWTH 2 DAYS Performed at [facility], [address] [place], [place] [address]    Report Status PENDING  Incomplete  MRSA Next Gen by PCR, Nasal     Status: Abnormal   Collection Time: 12/27/23 11:15 AM   Specimen: Nasal Mucosa; Nasal Swab  Result Value Ref Range Status   MRSA by PCR Next Gen DETECTED (A) NOT DETECTED Final    Comment: RESULT CALLED TO, READ BACK BY AND  VERIFIED WITH: EDGAR @ 1413 ON [id number] BY HENDERSON L (NOTE) The GeneXpert MRSA Assay (FDA approved for NASAL specimens only), is one component of a comprehensive MRSA colonization surveillance program. It is not intended to diagnose MRSA infection nor to guide or monitor treatment for MRSA infections. Test performance is not FDA approved in patients less than 83 years old. Performed at [facility], [address] [place], [place] [address]      Labs: BNP (last 3 results) No results for input(s): BNP in the last 8760 hours. Basic Metabolic Panel: Recent Labs  Lab 12/27/23 0016 12/27/23 0305 12/27/23 1211 12/28/23 0344 12/29/23 0452  NA 139  --  139 139 136  K 3.5  --  3.5 3.9 4.1  CL 109  --  104 107 104  CO2 20*  --  23 23 26   GLUCOSE 176*  --  146* 127* 88  BUN 22*  --  12 11 11   CREATININE 1.10*  --  0.74 0.67 0.62  CALCIUM  8.0*  --  8.1* 7.9* 8.3*  MG  --  1.8  --  2.0 1.9  PHOS  --  3.4  --   --   --    Liver Function Tests: Recent Labs  Lab 12/27/23 0016 12/27/23 1211 12/28/23 0344 12/29/23 0452  AST 43* 45* 30  31  ALT 72* 70* 56* 59*  ALKPHOS 71 67 75 69  BILITOT 0.6 0.5 0.2 0.6  PROT 5.7* 6.1* 5.3* 5.6*  ALBUMIN 2.8* 3.0* 2.7* 2.8*   Recent Labs  Lab 12/27/23 0016  LIPASE 24   No results for input(s): AMMONIA in the last 168 hours. CBC: Recent Labs  Lab 12/27/23 0016 12/27/23 0530 12/28/23 0344 12/29/23 0452  WBC 23.4* 12.3* 15.8* 8.3  HGB 13.9 13.4 11.8* 13.5  HCT 42.7 41.1 35.4* 42.1  MCV 93.0 93.4 92.7 91.9  PLT 244 199 203 187   Cardiac Enzymes: No results for input(s): CKTOTAL, CKMB, CKMBINDEX, TROPONINI in the last 168 hours. BNP: Invalid input(s): POCBNP CBG: No results for input(s): GLUCAP in the last 168 hours. D-Dimer No results for input(s): DDIMER in the last 72 hours. Hgb A1c Recent Labs    12/27/23 0016  HGBA1C 5.1   Lipid Profile No results for input(s): CHOL, HDL, LDLCALC, TRIG, CHOLHDL, LDLDIRECT in the last 72 hours. Thyroid function studies No results for input(s): TSH, T4TOTAL, T3FREE, THYROIDAB in the last 72 hours.  Invalid input(s): FREET3 Anemia work up No results for input(s): VITAMINB12, FOLATE, FERRITIN, TIBC, IRON, RETICCTPCT in the last 72 hours. Urinalysis    Component Value Date/Time   COLORURINE YELLOW 12/27/2023 1114   APPEARANCEUR HAZY (A) 12/27/2023 1114   LABSPEC >1.046 (H) 12/27/2023 1114   PHURINE 5.0 12/27/2023 1114   GLUCOSEU NEGATIVE 12/27/2023 1114   HGBUR NEGATIVE 12/27/2023 1114   BILIRUBINUR NEGATIVE 12/27/2023 1114   KETONESUR NEGATIVE 12/27/2023 1114   PROTEINUR 30 (A) 12/27/2023 1114   UROBILINOGEN 0.2 08/03/2014 1630   NITRITE NEGATIVE 12/27/2023 1114   LEUKOCYTESUR NEGATIVE 12/27/2023 1114   Sepsis Labs Recent Labs  Lab 12/27/23 0016 12/27/23 0530 12/28/23 0344 12/29/23 0452  WBC 23.4* 12.3* 15.8* 8.3   Microbiology Recent Results (from the past 240 hours)  Culture, blood (Routine X 2) w Reflex to ID Panel     Status: None (Preliminary result)    Collection Time: 12/27/23  1:20 AM   Specimen: BLOOD  Result Value Ref Range Status   Specimen Description BLOOD BLOOD RIGHT HAND  Final   Special Requests   Final    BOTTLES DRAWN AEROBIC ONLY Blood Culture adequate volume   Culture   Final    NO GROWTH 2 DAYS Performed at [facility], [address], [place], [place] [address]    Report Status PENDING  Incomplete  Culture, blood (Routine X 2) w Reflex to ID Panel     Status: None (Preliminary result)   Collection Time: 12/27/23  2:59 AM   Specimen: BLOOD  Result Value Ref Range Status   Specimen Description BLOOD BLOOD LEFT HAND  Final   Special Requests   Final    BOTTLES DRAWN AEROBIC AND ANAEROBIC Blood Culture adequate volume   Culture   Final    NO GROWTH 2 DAYS Performed at [facility], [address], [place], [place] [address]    Report Status PENDING  Incomplete  MRSA Next Gen by PCR, Nasal     Status: Abnormal   Collection Time: 12/27/23 11:15 AM   Specimen: Nasal Mucosa; Nasal Swab  Result Value Ref Range Status   MRSA by PCR Next Gen DETECTED (A) NOT DETECTED Final    Comment: RESULT CALLED TO, READ BACK BY AND VERIFIED WITH: EDGAR @ 1413 ON [id number] BY HENDERSON L (NOTE) The GeneXpert MRSA Assay (FDA approved for NASAL specimens only), is one component of a comprehensive MRSA colonization surveillance program. It is not intended to diagnose MRSA infection nor to guide or monitor treatment for MRSA infections. Test performance is not FDA approved in patients less than 44 years old. Performed at [facility], [address], [place], [place] [address]      Time coordinating discharge: 35 minutes  SIGNED:   Adron JONETTA Fairly, DO [facility] 12/29/2023, 9:52 AM  If 7PM-7AM, please contact night-coverage [email or web address]

## 2024-01-01 LAB — CULTURE, BLOOD (ROUTINE X 2)
Culture: NO GROWTH
Culture: NO GROWTH
Special Requests: ADEQUATE
Special Requests: ADEQUATE
